# Patient Record
Sex: Male | Born: 1970
Health system: Southern US, Community
[De-identification: ages and names within clinical notes are randomized; demographics above are authoritative.]

## PROBLEM LIST (undated history)

## (undated) DIAGNOSIS — IMO0001 Reserved for inherently not codable concepts without codable children: Secondary | ICD-10-CM

## (undated) DIAGNOSIS — F32A Depression, unspecified: Secondary | ICD-10-CM

## (undated) DIAGNOSIS — E119 Type 2 diabetes mellitus without complications: Secondary | ICD-10-CM

## (undated) DIAGNOSIS — G4730 Sleep apnea, unspecified: Secondary | ICD-10-CM

## (undated) DIAGNOSIS — Z794 Long term (current) use of insulin: Secondary | ICD-10-CM

## (undated) DIAGNOSIS — I10 Essential (primary) hypertension: Secondary | ICD-10-CM

## (undated) DIAGNOSIS — J342 Deviated nasal septum: Secondary | ICD-10-CM

## (undated) DIAGNOSIS — F419 Anxiety disorder, unspecified: Secondary | ICD-10-CM

## (undated) DIAGNOSIS — M1990 Unspecified osteoarthritis, unspecified site: Secondary | ICD-10-CM

## (undated) HISTORY — PX: WISDOM TOOTH EXTRACTION: SHX21

## (undated) HISTORY — DX: Depression, unspecified: F32.A

## (undated) HISTORY — DX: Essential (primary) hypertension: I10

## (undated) HISTORY — PX: SHOULDER ARTHROSCOPY: SHX128

## (undated) HISTORY — PX: LAPAROSCOPIC GASTRIC SLEEVE RESECTION: SHX5895

## (undated) HISTORY — DX: Sleep apnea, unspecified: G47.30

## (undated) HISTORY — DX: Anxiety disorder, unspecified: F41.9

## (undated) HISTORY — PX: TYMPANOSTOMY TUBE PLACEMENT: SHX32

## (undated) HISTORY — PX: TONSILLECTOMY AND ADENOIDECTOMY: SHX28

## (undated) HISTORY — PX: LUMBAR FUSION: SHX111

---

## 2014-07-23 HISTORY — PX: CARPAL TUNNEL RELEASE: SHX101

## 2015-01-06 ENCOUNTER — Ambulatory Visit (INDEPENDENT_AMBULATORY_CARE_PROVIDER_SITE_OTHER): Payer: 59 | Admitting: Internal Medicine

## 2015-01-06 ENCOUNTER — Encounter: Payer: Self-pay | Admitting: Internal Medicine

## 2015-01-06 VITALS — BP 142/77 | HR 96 | Temp 98.0°F | Wt 309.9 lb

## 2015-01-06 DIAGNOSIS — Z23 Encounter for immunization: Secondary | ICD-10-CM | POA: Insufficient documentation

## 2015-01-06 DIAGNOSIS — N529 Male erectile dysfunction, unspecified: Secondary | ICD-10-CM | POA: Diagnosis not present

## 2015-01-06 DIAGNOSIS — Z0000 Encounter for general adult medical examination without abnormal findings: Secondary | ICD-10-CM | POA: Insufficient documentation

## 2015-01-06 DIAGNOSIS — M79641 Pain in right hand: Secondary | ICD-10-CM | POA: Diagnosis not present

## 2015-01-06 DIAGNOSIS — E119 Type 2 diabetes mellitus without complications: Secondary | ICD-10-CM | POA: Diagnosis not present

## 2015-01-06 DIAGNOSIS — I10 Essential (primary) hypertension: Secondary | ICD-10-CM | POA: Insufficient documentation

## 2015-01-06 DIAGNOSIS — Z794 Long term (current) use of insulin: Secondary | ICD-10-CM

## 2015-01-06 DIAGNOSIS — M6530 Trigger finger, unspecified finger: Secondary | ICD-10-CM | POA: Insufficient documentation

## 2015-01-06 DIAGNOSIS — E1169 Type 2 diabetes mellitus with other specified complication: Secondary | ICD-10-CM | POA: Insufficient documentation

## 2015-01-06 LAB — GLUCOSE, CAPILLARY: Glucose-Capillary: 246 mg/dL — ABNORMAL HIGH (ref 65–99)

## 2015-01-06 LAB — POCT GLYCOSYLATED HEMOGLOBIN (HGB A1C): Hemoglobin A1C: 6.8

## 2015-01-06 NOTE — Assessment & Plan Note (Signed)
Patient <44 y/o with DM. -PPSV 23

## 2015-01-06 NOTE — Assessment & Plan Note (Signed)
-  Influenza vaccine today -PPSV23 today (Patient <46 y/o w/ DM)

## 2015-01-06 NOTE — Progress Notes (Signed)
Patient ID: Adam [name], male   DOB: 1970-10-17, 44 y.o.   MRN: [id number]   Subjective:   Patient ID: Adam [name] male   DOB: 1970-09-16 44 y.o.   MRN: [id number]  HPI: [name] is a 44 y.o. male with PMH of DM, HTN, HLD, ED, allergies, depression and anxiety, OSA on CPAP, and carpal tunnel of the right hand s/p CTR in April 2016 who presents to clinic for the first time for new patient establishment. He is recently moving to [place] from [place], and states that previous medical records have been signed for release to [place].  Patient states he is due for Hgb A1C and needs test done for work (with value <8.0).   Also requesting advice on where/how to obtain supplies for CPAP.   Patient informs me that his previous physicians believe he may have low testosterone and is curious about testing for that.    Past Medical History  Diagnosis Date  . Diabetes mellitus without complication   . Hypertension   . Hyperlipidemia   . Allergy   . Depression   . Anxiety   . Sleep apnea    Current Outpatient Prescriptions  Medication Sig Dispense Refill  . albuterol (PROVENTIL HFA;VENTOLIN HFA) 108 (90 BASE) MCG/ACT inhaler Inhale 2 puffs into the lungs every 6 (six) hours as needed for wheezing or shortness of breath.    Marland Kitchen aspirin 81 MG tablet Take 81 mg by mouth daily.    . Blood Glucose Monitoring Suppl (ONETOUCH ULTRALINK) W/DEVICE KIT by Does not apply route.    . DULoxetine (CYMBALTA) 60 MG capsule Take 60 mg by mouth daily.    Marland Kitchen lisinopril (PRINIVIL,ZESTRIL) 20 MG tablet Take 20 mg by mouth daily.    . metFORMIN (GLUCOPHAGE) 1000 MG tablet Take 1,000 mg by mouth 2 (two) times daily with a meal.    . pioglitazone (ACTOS) 45 MG tablet Take 45 mg by mouth daily.    . simvastatin (ZOCOR) 20 MG tablet Take 20 mg by mouth daily.    . tadalafil (CIALIS) 20 MG tablet Take 20 mg by mouth daily as needed for erectile dysfunction.     No current facility-administered medications for  this visit.   Family History  Problem Relation Age of Onset  . Hyperlipidemia Mother   . Aneurysm Mother   . Diabetes Father   . Hyperlipidemia Father    Social History   Social History  . Marital Status: Married    Spouse Name: N/A  . Number of Children: N/A  . Years of Education: N/A   Social History Main Topics  . Smoking status: Never Smoker   . Smokeless tobacco: Former Systems developer    Types: Chew  . Alcohol Use: Yes  . Drug Use: No  . Sexual Activity: Not Asked   Other Topics Concern  . None   Social History Narrative  . None   Review of Systems: Review of Systems  Constitutional: Negative for fever, chills and weight loss.  Respiratory: Negative for cough, shortness of breath and wheezing.   Cardiovascular: Negative for chest pain, palpitations and leg swelling.  Gastrointestinal: Negative for heartburn, nausea, vomiting, abdominal pain, diarrhea and constipation.  Musculoskeletal: Positive for joint pain. Negative for myalgias.       Joint pain right hand, bilateral knees.  Neurological: Negative for dizziness, tingling, sensory change and headaches.    Objective:  Physical Exam: Filed Vitals:   01/06/15 1525  BP: 142/77  Pulse: 96  Temp: 98 F (  36.7 C)  TempSrc: Oral  Weight: 309 lb 14.4 oz (140.57 kg)  SpO2: 96%   Physical Exam  Constitutional: He is oriented to person, place, and time. He appears well-developed and well-nourished.  Pleasant, NAD, large body habitus.  HENT:  Head: Normocephalic and atraumatic.  Eyes: EOM are normal. Pupils are equal, round, and reactive to light.  Cardiovascular: Normal rate and regular rhythm.   No murmur heard. Pulmonary/Chest: Effort normal and breath sounds normal.  Abdominal: Soft. Bowel sounds are normal. There is no tenderness.  Musculoskeletal: Normal range of motion. He exhibits no edema.  Tenderness at base of thumb. Interphalangeal thumb of right hand with catching, snapping, with audible clicking on  flexion.   Neurological: He is alert and oriented to person, place, and time.  Skin: Skin is warm.  Psychiatric: He has a normal mood and affect.    Assessment & Plan:  Please see problem based charting.

## 2015-01-06 NOTE — Assessment & Plan Note (Signed)
Patient diagnosed with T2DM 13 years ago after new onset episode of DKA. States he was worked up for type 1 vs type 2. He currently is on Lantus 42 units at night which he scaled back to about 1 month ago after he was having low blood sugars in the 80s. He states that he has missed some meals and is off his usual schedule due to his move to [place] and being off of work at this time. Also on Metformin 1000 mg BID and pioglitazone 45 mg daily. Patient with average blood glucose of 167 in the last month. States he feels highs around 240 with headache and lows around 85 with shakes, dizziness, and nausea which is easily corrected with milk or juice.  CBG today of 246 (he reports eating lunch prior to office visit) Hgb A1C today of 6.8 which he states is the same as his prior level.  -Check BMP to assess renal function (currently on metformin and lisinopril) -Referral to Debera Lat, RD for diabetic and nutrition counseling

## 2015-01-06 NOTE — Assessment & Plan Note (Signed)
Patient reports history of carpal tunnel syndrome in right hand with surgical release in April 2016. He continues to have pain at the base of the right thumb and at the interphalangeal joint. There is catching and audible clicking with flexion. This is possibly a trigger finger tenosynovitis or perhaps an arthritic process. -DG right hand -No new medication today, consider initial treatment with activity modification, splinting, and NSAIDS

## 2015-01-06 NOTE — Assessment & Plan Note (Signed)
BP Readings from Last 3 Encounters:  01/06/15 142/77   -Continue Lisinopril 20 mg daily

## 2015-01-06 NOTE — Patient Instructions (Signed)
It was a pleasure to meet you [name].  We will work on determining how to provide you access to your CPAP supplies.  We have given you the flu shot and pneumonia vaccine PPSV23 today.  I will put the order in for the right hand xray, blood test to check your kidney function, and testosterone levels.  Your Hgb A1C is 6.8 today.  We will refer you to our diabetes and nutrition counselor, Butch Penny Plyler RD to help with your weight loss goals.  Please come in tomorrow morning at 9:30 am for your blood test and let them know you are here just for bloodwork.  Please follow up with [place] in 4-6 weeks.

## 2015-01-06 NOTE — Assessment & Plan Note (Signed)
Patient with erectile dysfunction for nearly 15 years. Is currently on Cialis. Patient reports that previous physicians believe this may be due to low testosterone, although his history of diabetes is a risk factor for vascular cause of ED.   -Will investigate with testosterone level tomorrow morning

## 2015-01-07 ENCOUNTER — Other Ambulatory Visit (INDEPENDENT_AMBULATORY_CARE_PROVIDER_SITE_OTHER): Payer: 59

## 2015-01-07 ENCOUNTER — Ambulatory Visit (HOSPITAL_COMMUNITY)
Admission: RE | Admit: 2015-01-07 | Discharge: 2015-01-07 | Disposition: A | Discharge status: Home / Self Care | Payer: 59 | Source: Ambulatory Visit | Attending: Internal Medicine | Admitting: Internal Medicine

## 2015-01-07 DIAGNOSIS — E1165 Type 2 diabetes mellitus with hyperglycemia: Secondary | ICD-10-CM

## 2015-01-07 DIAGNOSIS — M79641 Pain in right hand: Secondary | ICD-10-CM

## 2015-01-07 DIAGNOSIS — E119 Type 2 diabetes mellitus without complications: Secondary | ICD-10-CM

## 2015-01-07 DIAGNOSIS — N529 Male erectile dysfunction, unspecified: Secondary | ICD-10-CM

## 2015-01-07 DIAGNOSIS — M79644 Pain in right finger(s): Secondary | ICD-10-CM | POA: Diagnosis not present

## 2015-01-07 DIAGNOSIS — Z794 Long term (current) use of insulin: Secondary | ICD-10-CM | POA: Diagnosis not present

## 2015-01-08 LAB — BMP8+ANION GAP
Anion Gap: 18 mmol/L (ref 10.0–18.0)
BUN/Creatinine Ratio: 18 (ref 9–20)
BUN: 13 mg/dL (ref 6–24)
CO2: 22 mmol/L (ref 18–29)
Calcium: 9.5 mg/dL (ref 8.7–10.2)
Chloride: 99 mmol/L (ref 97–108)
Creatinine, Ser: 0.74 mg/dL — ABNORMAL LOW (ref 0.76–1.27)
GFR calc Af Amer: 128 mL/min/{1.73_m2} (ref >59)
GFR calc non Af Amer: 111 mL/min/{1.73_m2} (ref >59)
Glucose: 190 mg/dL — ABNORMAL HIGH (ref 65–99)
Potassium: 4.7 mmol/L (ref 3.5–5.2)
Sodium: 139 mmol/L (ref 134–144)

## 2015-01-08 LAB — TESTOSTERONE: Testosterone: 284 ng/dL — ABNORMAL LOW (ref 348–1197)

## 2015-01-10 ENCOUNTER — Telehealth: Payer: Self-pay | Admitting: *Deleted

## 2015-01-10 NOTE — Telephone Encounter (Signed)
Pt here on Fri 09/16 for lab only appt.  Has paperwork from [facility] (DOT physical).  Because pt has a diagnosis of HTN, DM, and OSA, they are requesting the following " a letter or medical records indicating the current status and treatment of the above condition(s) with recent lab date, vital signs, medication changes, etc" Letter is also requesting sleep study report or recent download of CPAP machine.  Form placed in [facility] medical records.Regenia Skeeter, Darlene Cassady9/19/20169:07 AM

## 2015-01-11 ENCOUNTER — Telehealth: Payer: Self-pay | Admitting: *Deleted

## 2015-01-11 NOTE — Progress Notes (Addendum)
Internal Medicine Clinic Attending  I saw and evaluated the patient.  I personally confirmed the key portions of the history and exam documented by Dr. Zada Finders and I reviewed pertinent patient test results.  The assessment, diagnosis, and plan were formulated together and I agree with the documentation in the resident's note.  Low testosterone levels on random check, 284. Will send a message to Dr Evette Doffing, who is the new PCP regarding this for further follow up. I tried to call the patient on [phone] but was not able to reach him or leave a message. I will contact triage to call the patient.  Madilyn Fireman MD MPH 01/11/2015 10:43 AM

## 2015-01-11 NOTE — Telephone Encounter (Signed)
Per dr Ellwood Dense called pt, discussed his testosterone value and what dr bhardwaj recommended, appt w/ dr Evette Doffing- first available- 11/7 at [place]. Reassured pt, he states he is good and will speak with dr Evette Doffing at appt about f/u

## 2015-01-27 ENCOUNTER — Other Ambulatory Visit: Payer: Self-pay | Admitting: Student in an Organized Health Care Education/Training Program

## 2015-01-27 DIAGNOSIS — E119 Type 2 diabetes mellitus without complications: Secondary | ICD-10-CM

## 2015-01-27 NOTE — Telephone Encounter (Signed)
Pt called requesting Lantus to be filled @ [facility].

## 2015-01-28 MED ORDER — INSULIN GLARGINE 100 UNIT/ML ~~LOC~~ SOLN: 42.0000 [IU] | Freq: Every day | SUBCUTANEOUS | Status: DC

## 2015-02-09 ENCOUNTER — Encounter: Payer: 59 | Admitting: Dietician

## 2015-02-22 ENCOUNTER — Other Ambulatory Visit: Payer: Self-pay | Admitting: Student in an Organized Health Care Education/Training Program

## 2015-02-22 DIAGNOSIS — Z794 Long term (current) use of insulin: Principal | ICD-10-CM

## 2015-02-22 DIAGNOSIS — N529 Male erectile dysfunction, unspecified: Secondary | ICD-10-CM

## 2015-02-22 DIAGNOSIS — E119 Type 2 diabetes mellitus without complications: Secondary | ICD-10-CM

## 2015-02-22 NOTE — Telephone Encounter (Signed)
Pt's wife called for refills on Cymbalta, Cialis, Lantus and Actos for a 90 day supply to be sent to [facility].

## 2015-02-24 MED ORDER — INSULIN GLARGINE 100 UNIT/ML ~~LOC~~ SOLN: 42.0000 [IU] | Freq: Every day | SUBCUTANEOUS | Status: DC

## 2015-02-24 MED ORDER — TADALAFIL 20 MG PO TABS: 20.0000 mg | ORAL_TABLET | Freq: Every day | ORAL | Status: DC | PRN

## 2015-02-24 MED ORDER — DULOXETINE HCL 60 MG PO CPEP: 60.0000 mg | ORAL_CAPSULE | Freq: Every day | ORAL | Status: DC

## 2015-02-24 MED ORDER — PIOGLITAZONE HCL 45 MG PO TABS: 45.0000 mg | ORAL_TABLET | Freq: Every day | ORAL | Status: DC

## 2015-02-28 ENCOUNTER — Ambulatory Visit (INDEPENDENT_AMBULATORY_CARE_PROVIDER_SITE_OTHER): Payer: 59 | Admitting: Dietician

## 2015-02-28 ENCOUNTER — Encounter: Payer: Self-pay | Admitting: Dietician

## 2015-02-28 ENCOUNTER — Ambulatory Visit (INDEPENDENT_AMBULATORY_CARE_PROVIDER_SITE_OTHER): Payer: 59 | Admitting: Student in an Organized Health Care Education/Training Program

## 2015-02-28 VITALS — BP 141/70 | HR 75 | Temp 98.7°F | Wt 305.7 lb

## 2015-02-28 DIAGNOSIS — Z713 Dietary counseling and surveillance: Secondary | ICD-10-CM | POA: Diagnosis not present

## 2015-02-28 DIAGNOSIS — M65311 Trigger thumb, right thumb: Secondary | ICD-10-CM

## 2015-02-28 DIAGNOSIS — N529 Male erectile dysfunction, unspecified: Secondary | ICD-10-CM

## 2015-02-28 DIAGNOSIS — E119 Type 2 diabetes mellitus without complications: Secondary | ICD-10-CM

## 2015-02-28 DIAGNOSIS — Z7984 Long term (current) use of oral hypoglycemic drugs: Secondary | ICD-10-CM

## 2015-02-28 DIAGNOSIS — G4733 Obstructive sleep apnea (adult) (pediatric): Secondary | ICD-10-CM

## 2015-02-28 DIAGNOSIS — Z794 Long term (current) use of insulin: Secondary | ICD-10-CM | POA: Diagnosis not present

## 2015-02-28 DIAGNOSIS — M6530 Trigger finger, unspecified finger: Secondary | ICD-10-CM

## 2015-02-28 DIAGNOSIS — I10 Essential (primary) hypertension: Secondary | ICD-10-CM

## 2015-02-28 DIAGNOSIS — Z9989 Dependence on other enabling machines and devices: Secondary | ICD-10-CM

## 2015-02-28 MED ORDER — INSULIN GLARGINE 100 UNIT/ML ~~LOC~~ SOLN: 30.0000 [IU] | Freq: Every day | SUBCUTANEOUS | Status: DC

## 2015-02-28 MED ORDER — LIRAGLUTIDE 18 MG/3ML ~~LOC~~ SOPN: 0.6000 mg | PEN_INJECTOR | Freq: Every day | SUBCUTANEOUS | Status: DC

## 2015-02-28 NOTE — Assessment & Plan Note (Signed)
Blood pressure under good control. Continue Lisinopril 20 mg daily, adding GLP1 inhibitor today will also likely improve BP control.

## 2015-02-28 NOTE — Assessment & Plan Note (Addendum)
Type 2 DM diagnosed around 2003. Current regimen is metformin 1000 mg twice a day, pioglitazone 45 mg daily, and Lantus 42 units nightly. Excellent A1c control at 6.8%. Patient has a strong desire for weight loss. I think his high dose of insulin has been associated with progressive weight gain over the last 10 years. We discussed different techniques to try to reduce his weight. We decided to start a GLP-1 agonist with the goal of downtitrating insulin products and promoting weightloss as well as improve cardiovascular health. We talked about side effect profiles including GI upset. - Start Liraglutide 0.6mg  daily, increase by 0.6mg  weekly to max dose of 3mg  daily. - Decrease Lantus to 30 units daily, and decrease further as able while uptitrating liraglutide.  - Recheck A1c in 3 months - Continue Asprin and Simvastatin for primary prevention - Lisinopril for renal protection

## 2015-02-28 NOTE — Patient Instructions (Signed)
Follow up in 5 weeks per appointment.   At follow up we will discuss how you are doing with decreasing alcoholic beverages and changing diet soda to clear ones without phosphoric acid  Please bring your blood sugar meter with you.   Call anytime between visits for questions or concerns.   Butch Penny [phone]

## 2015-02-28 NOTE — Assessment & Plan Note (Addendum)
Exam consistent with right thumb trigger finger related to moderate osteoarthritis. I gave him an injection of triamcinolone into the tendon sheath for symptomatic treatment.   Trigger Point Injection   Pre-operative diagnosis: myofascial pain  After risks and benefits were explained including bleeding, infection, worsening of the pain, damage to the area being injected, weakness, allergic reaction to medications, vascular injection, and nerve damage, signed consent was obtained.  All questions were answered.    The area of the trigger point was identified and the skin prepped three times with alcohol and the alcohol allowed to dry.  Next, a 25 gauge 1 inch needle was placed in the area of the trigger point.  Once reproduction of the pain was elicited and negative aspiration confirmed, the trigger point was injected and the needle removed.    The patient did tolerate the procedure well and there were not complications.    Medication used: 10 mg Kenalog; 0.6 mL 1% Lidocaine.    Trigger points injected: 1    Trigger point(s) location(s):  right thumb

## 2015-02-28 NOTE — Assessment & Plan Note (Signed)
OSA diagnosed in 2015 with two part sleep study in [place]. He is now using CPAP machine with good compliance. Continue nocturnal therapy.

## 2015-02-28 NOTE — Assessment & Plan Note (Signed)
Here for follow up of low AM testosterone level at 280. No signs of early androgen deficiency, not interested in fertility. Complains of mild fatigue throughout the day. Decreased tumescence and lower sex drive. He has intercourse 2-3 times weekly. Etiology could be due to mild hypoandrogenism with age and longterm use of opiates. We talked about the mild benefits of testosterone therapy and the likely risks of prostate cancer and increased risk of VTE. We decided to defer treatment, monitor symptoms, and potentially recheck testosterone in one year if symptoms worsen.

## 2015-02-28 NOTE — Patient Instructions (Addendum)
1. Start Victoza at a low dose 0.6mg  daily.   0.6mg  daily for one week, increase to 1.2mg  daily week 2, then 1.8mg  daily week 3, the 2.4mg  daily week 4, 3mg  daily week 5. 3mg  daily is the maximum dose.   2. Decrease your Lantus to 30 units daily. Continue to decrease the dose as you increase Victoza dose.   Liraglutide injection What is this medicine? LIRAGLUTIDE (LIR a GLOO tide) is used to improve blood sugar control in adults with type 2 diabetes. This medicine may be used with other oral diabetes medicines. This medicine may be used for other purposes; ask your health care provider or pharmacist if you have questions. What should I tell my health care provider before I take this medicine? They need to know if you have any of these conditions: -endocrine tumors (MEN 2) or if someone in your family had these tumors -gallstones -high cholesterol -history of alcohol abuse problem -history of pancreatitis -kidney disease or if you are on dialysis -liver disease -previous swelling of the tongue, face, or lips with difficulty breathing, difficulty swallowing, hoarseness, or tightening of the throat -stomach problems -suicidal thoughts, plans, or attempt; a previous suicide attempt by you or a family member -thyroid cancer or if someone in your family had thyroid cancer -an unusual or allergic reaction to liraglutide, medicines, foods, dyes, or preservatives -pregnant or trying to get pregnant -breast-feeding How should I use this medicine? This medicine is for injection under the skin of your upper leg, stomach area, or upper arm. You will be taught how to prepare and give this medicine. Use exactly as directed. Take your medicine at regular intervals. Do not take it more often than directed. It is important that you put your used needles and syringes in a special sharps container. Do not put them in a trash can. If you do not have a sharps container, call your pharmacist or healthcare  provider to get one. A special MedGuide will be given to you by the pharmacist with each prescription and refill. Be sure to read this information carefully each time. Talk to your pediatrician regarding the use of this medicine in children. Special care may be needed. Overdosage: If you think you have taken too much of this medicine contact a poison control center or emergency room at once. NOTE: This medicine is only for you. Do not share this medicine with others. What if I miss a dose? If you miss a dose, take it as soon as you can. If it is almost time for your next dose, take only that dose. Do not take double or extra doses. What may interact with this medicine? -acetaminophen -atorvastatin -birth control pills -digoxin -griseofulvin -lisinoprilMany medications may cause changes in blood sugar, these include: -alcohol containing beverages -aspirin and aspirin-like drugs -chloramphenicol -chromium -diuretics -male hormones, such as estrogens or progestins, birth control pills -heart medicines -isoniazid -male hormones or anabolic steroids -medications for weight loss -medicines for allergies, asthma, cold, or cough -medicines for mental problems -medicines called MAO inhibitors - Nardil, Parnate, Marplan, Eldepryl -niacin -NSAIDS, such as ibuprofen -pentamidine -phenytoin -probenecid -quinolone antibiotics such as ciprofloxacin, levofloxacin, ofloxacin -some herbal dietary supplements -steroid medicines such as prednisone or cortisone -thyroid hormonesSome medications can hide the warning symptoms of low blood sugar (hypoglycemia). You may need to monitor your blood sugar more closely if you are taking one of these medications. These include: -beta-blockers, often used for high blood pressure or heart problems (examples include atenolol, metoprolol, propranolol) -  clonidine -guanethidine -reserpine This list may not describe all possible interactions. Give your health  care provider a list of all the medicines, herbs, non-prescription drugs, or dietary supplements you use. Also tell them if you smoke, drink alcohol, or use illegal drugs. Some items may interact with your medicine. What should I watch for while using this medicine? Visit your doctor or health care professional for regular checks on your progress. A test called the HbA1C (A1C) will be monitored. This is a simple blood test. It measures your blood sugar control over the last 2 to 3 months. You will receive this test every 3 to 6 months. Learn how to check your blood sugar. Learn the symptoms of low and high blood sugar and how to manage them. Always carry a quick-source of sugar with you in case you have symptoms of low blood sugar. Examples include hard sugar candy or glucose tablets. Make sure others know that you can choke if you eat or drink when you develop serious symptoms of low blood sugar, such as seizures or unconsciousness. They must get medical help at once. Tell your doctor or health care professional if you have high blood sugar. You might need to change the dose of your medicine. If you are sick or exercising more than usual, you might need to change the dose of your medicine. Do not skip meals. Ask your doctor or health care professional if you should avoid alcohol. Many nonprescription cough and cold products contain sugar or alcohol. These can affect blood sugar. Liraglutide pens and cartridges should never be shared. Even if the needle is changed, sharing may result in passing of viruses like hepatitis or HIV. Wear a medical ID bracelet or chain, and carry a card that describes your disease and details of your medicine and dosage times. Patients and their families should watch out for worsening depression or thoughts of suicide. Also watch out for sudden changes in feelings such as feeling anxious, agitated, panicky, irritable, hostile, aggressive, impulsive, severely restless, overly  excited and hyperactive, or not being able to sleep. If this happens, especially at the beginning of treatment or after a change in dose, call your health care professional. What side effects may I notice from receiving this medicine? Side effects that you should report to your doctor or health care professional as soon as possible: -allergic reactions like skin rash, itching or hives, swelling of the face, lips, or tongue -breathing problems -fever, chills -loss of appetite -signs and symptoms of low blood sugar such as feeling anxious, confusion, dizziness, increased hunger, unusually weak or tired, sweating, shakiness, cold, irritable, headache, blurred vision, fast heartbeat, loss of consciousness -trouble passing urine or change in the amount of urine -unusual stomach pain or upset -vomiting Side effects that usually do not require medical attention (Report these to your doctor or health care professional if they continue or are bothersome.): -constipation -diarrhea -fatigue -headache -nausea This list may not describe all possible side effects. Call your doctor for medical advice about side effects. You may report side effects to FDA at 1-800-FDA-1088. Where should I keep my medicine? Keep out of the reach of children. Store unopened pen in a refrigerator between 2 and 8 degrees C (36 and 46 degrees F). Do not freeze or use if the medicine has been frozen. Protect from light and excessive heat. After you first use the pen, it can be stored at room temperature between 15 and 30 degrees C (59 and 86 degrees F) or  in a refrigerator. Throw away your used pen after 30 days or after the expiration date, whichever comes first. Do not store your pen with the needle attached. If the needle is left on, medicine may leak from the pen. NOTE: This sheet is a summary. It may not cover all possible information. If you have questions about this medicine, talk to your doctor, pharmacist, or health care  provider.    2016, Elsevier/Gold Standard. (2013-06-18 10:19:14)

## 2015-02-28 NOTE — Progress Notes (Signed)
Medical Nutrition Therapy:  Appt start time: 0910 end time:  1000. Visit # 1- suggested 3-4 series of visits  Assessment:  Primary concerns today: weight loss and healthy diet Mr. Veras has had diabetes ~ 12 years. He had diabetes training when he was first diagnosed and learned a lot about food from his mother. He is motivated to change his diet to be sure he has all the nutrients to be healthy and decrease his weight. He likes to cook, has supportive wife and daughter. He has busy job where he travels and [place] out often, he tries to chose healthy meals as much as possible. His first goal is to decrease the amount of alcohol he drinks and begin changing his diet drinks to clear ones without phosphoric acid. His new diabetes regimen should allow for weight loss.   Preferred Learning Style: No preference indicated  Learning Readiness: Ready and Change in progress  ANTHROPOMETRICS:  BMI: >40- class 3 obesity WEIGHT HISTORY:"always big", weight watchers in early 37s got down to 190#, gained to 245# until dx with diabetes at ~34, then gained ~50# after diabetes diagnosis, no goal weight as of today, recommend at least 7% loss to ~280# SLEEP:uses Cpap and sleeps ~ 8 hours MEDICATIONS: actos, lantus decreasing from 50 to 30 units today as he begins victoza and self titrates it, metformin BLOOD SUGAR: A1C is 6.8% and he reports this has been stable lately, he reports blood sugars ~ 140 in the am,  has lows at times, yesterday when he exercised and delayed eating he felt low at CBG of 87, he treats lows with milk or juice DIETARY INTAKE: Usual eating pattern includes 2-3 meals and ~2  snacks per day. Everyday foods include fruit, nuts, vegetables, meat, dairy.  Avoided foods include cereal, not big on sweets, chips one time a week.   24-hr recall: ( estimated ~ 2500 or more calories/day)  B (6 AM): 12 oz coffee with skim milk and equal Snk ( before 8 AM): 3 times/week has a breakfast sandwich from a  restaurant: cheese, egg, sausage on a bun, diet soda, scrambled egg and toast on weekends uses olive oil, healthier sausage L ( 1130-3PM): ham & cheese on wheat sandwich or salad with blue cheese dressing D ( PM): Home: chicken, fish, vegetable, salad, out- [employer]- [place] [employer], salad,.diet soda, [employer] steak or chicken & vegetable, salad, diet soda Snk ( PM): not regularly, but when he does it is:: fruit or carrots and hummus or almonds or other unsalted nuts Beverages: diet soda- coke, water, coffee, g2 Gatorade, vodka 3-4 drinks 3x/week, 1-2 beer a few times a week  Usual physical activity: hiked 2 miles this weekend, active on his job, need to assess in more detail at next visit  Estimated daily energy needs:  3,100 Calories/day to maintain  weight.   2,600 Calories/day to lose 1 lb per week.   2,100 Calories/day to lose 2 lb per week.  270-310 g carbohydrates ~130 g protein  ~65 g fat 28 g alcohol   Progress Towards Goal(s):  In progress.   Nutritional Diagnosis:  NI-1.5 Excessive energy intake As related to food and beverage choices.  As evidenced by his 24 hours recall and description of food/beverage intake..    Intervention:  Nutrition assessment & education about current dietary recommendations on alcohol intake, guidelines for a well balanced healthy diet. Education sheet on GLP-1s was provided to patient today and discussed possible side effect of nausea and how to  handle  Coordination of care: none needed today Teaching Method Utilized: Visual,  Auditory Handouts given during visit include: GLP-1 education sheet, healthy oils/fats Barriers to learning/adherence to lifestyle change: none discussed today Demonstrated degree of understanding via:  Teach Back   Monitoring/Evaluation:  Dietary intake, exercise, meter, and body weight in 5 week(s).

## 2015-02-28 NOTE — Progress Notes (Signed)
   See Encounters tab for problem-based medical decision making  __________________________________________________________  HPI:  44 year old man with type 2 diabetes presents today for discussion of low testosterone levels. Complaints today include generalized fatigue throughout the day. 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 This is a physically intensive job. Feels like he is having increased energy at the end of his day. Denies significant sleepiness. No dyspnea on exertion or chest pain. He reports some reduced libido. Still has nocturnal erections normally. Currently engaging in sex about 2-3 times weekly with his wife. No changes in hair distribution. He does endorse progressive weight gain over the last 10 years. He is trying to exercise more and eat a better diet. Lost 4 pounds since our last clinic visit. Eating and drinking well. No fevers or chills. Sleeps well at night, compliant with CPAP machine every night the last 1 year. Compliant with his diabetes medications, recently he has been titrating down Lantus from 50 units daily to 42 units daily. Denies any recent hypoglycemic events.   __________________________________________________________  Problem List: Patient Active Problem List   Diagnosis Date Noted  . Obstructive sleep apnea 02/28/2015  . Diabetes ([place]) 01/06/2015  . Erectile dysfunction 01/06/2015  . Trigger finger 01/06/2015  . Healthcare maintenance 01/06/2015  . Essential hypertension 01/06/2015    Medications: Reconciled today in Epic __________________________________________________________  Physical Exam:  Vital Signs: Filed Vitals:   02/28/15 0824  BP: 141/70  Pulse: 75  Temp: 98.7 F (37.1 C)  TempSrc: Oral  Weight: 305 lb 11.2 oz (138.665 kg)  SpO2: 99%    Gen: Well appearing, NAD Neck: No cervical LAD, thick neck, No thyromegaly or nodules, No JVD. CV: RRR, no murmurs Pulm: Normal effort, CTA throughout, no  wheezing Abd: Soft, NT, ND, normal BS.  Ext: Warm, no edema, normal joints Skin: No atypical appearing moles. No rashes, many macules on his back, heavily tatooed.

## 2015-04-01 ENCOUNTER — Encounter (HOSPITAL_COMMUNITY): Payer: Self-pay | Admitting: Emergency Medicine

## 2015-04-01 ENCOUNTER — Emergency Department (HOSPITAL_COMMUNITY)
Admission: EM | Admit: 2015-04-01 | Discharge: 2015-04-01 | Disposition: A | Discharge status: Home / Self Care | Payer: 59 | Source: Home / Self Care | Attending: Family Medicine | Admitting: Family Medicine

## 2015-04-01 ENCOUNTER — Emergency Department (INDEPENDENT_AMBULATORY_CARE_PROVIDER_SITE_OTHER): Payer: 59

## 2015-04-01 DIAGNOSIS — J302 Other seasonal allergic rhinitis: Secondary | ICD-10-CM

## 2015-04-01 MED ORDER — METHYLPREDNISOLONE SODIUM SUCC 125 MG IJ SOLR: 125.0000 mg | Freq: Once | INTRAMUSCULAR | Status: DC

## 2015-04-01 MED ORDER — ALBUTEROL SULFATE (2.5 MG/3ML) 0.083% IN NEBU
2.5000 mg | INHALATION_SOLUTION | Freq: Once | RESPIRATORY_TRACT | Status: AC
  Administered 2015-04-01: 2.5 mg via RESPIRATORY_TRACT

## 2015-04-01 MED ORDER — ALBUTEROL SULFATE (2.5 MG/3ML) 0.083% IN NEBU
INHALATION_SOLUTION | RESPIRATORY_TRACT | Status: AC
  Filled 2015-04-01: qty 3

## 2015-04-01 MED ORDER — METHYLPREDNISOLONE SODIUM SUCC 125 MG IJ SOLR
INTRAMUSCULAR | Status: AC
  Filled 2015-04-01: qty 2

## 2015-04-01 MED ORDER — ALBUTEROL SULFATE HFA 108 (90 BASE) MCG/ACT IN AERS: 2.0000 | INHALATION_SPRAY | Freq: Four times a day (QID) | RESPIRATORY_TRACT | Status: DC | PRN

## 2015-04-01 MED ORDER — IPRATROPIUM-ALBUTEROL 0.5-2.5 (3) MG/3ML IN SOLN
3.0000 mL | Freq: Once | RESPIRATORY_TRACT | Status: AC
  Administered 2015-04-01: 3 mL via RESPIRATORY_TRACT

## 2015-04-01 MED ORDER — IPRATROPIUM-ALBUTEROL 0.5-2.5 (3) MG/3ML IN SOLN
RESPIRATORY_TRACT | Status: AC
  Filled 2015-04-01: qty 3

## 2015-04-01 NOTE — ED Notes (Signed)
Reports having a cold that has gone into his chest.  Reports symptoms for 2 weeks.  Patient complains of wheezing, sob, and heaviness

## 2015-04-01 NOTE — ED Provider Notes (Signed)
CSN: [id number]     Arrival date & time 04/01/15  1259 History   First MD Initiated Contact with Patient 04/01/15 1322     Chief Complaint  Patient presents with  . URI  . Bronchitis   (Consider location/radiation/quality/duration/timing/severity/associated sxs/prior Treatment) Patient is a 44 y.o. male presenting with URI. The history is provided by the patient and the spouse.  URI Presenting symptoms: congestion and cough   Presenting symptoms: no fever and no rhinorrhea   Severity:  Moderate Duration:  2 weeks Progression:  Worsening Chronicity:  Chronic Relieved by:  Nothing Worsened by:  Nothing tried Ineffective treatments:  OTC medications Associated symptoms: wheezing   Risk factors comment:  Nonsmoker   Past Medical History  Diagnosis Date  . Diabetes mellitus without complication ([place])   . Hypertension   . Hyperlipidemia   . Allergy   . Depression   . Anxiety   . Sleep apnea    Past Surgical History  Procedure Laterality Date  . Shoulder surgery    . Spine surgery      Spinal L5-S1  . Carpal tunnel release  April 2016   Family History  Problem Relation Age of Onset  . Hyperlipidemia Mother   . Aneurysm Mother   . Diabetes Father   . Hyperlipidemia Father    Social History  Substance Use Topics  . Smoking status: Never Smoker   . Smokeless tobacco: Former Systems developer    Types: Chew  . Alcohol Use: Yes    Review of Systems  Constitutional: Negative.  Negative for fever.  HENT: Positive for congestion. Negative for postnasal drip and rhinorrhea.   Respiratory: Positive for cough, shortness of breath and wheezing.   Cardiovascular: Negative.   All other systems reviewed and are negative.   Allergies  Review of patient's allergies indicates no known allergies.  Home Medications   Prior to Admission medications   Medication Sig Start Date End Date Taking? Authorizing Provider  albuterol (PROVENTIL HFA;VENTOLIN HFA) 108 (90 BASE) MCG/ACT inhaler  Inhale 2 puffs into the lungs every 6 (six) hours as needed for wheezing or shortness of breath. 04/01/15   Billy Fischer, MD  aspirin 81 MG tablet Take 81 mg by mouth daily.    Historical Provider, MD  Blood Glucose Monitoring Suppl (ONETOUCH ULTRALINK) W/DEVICE KIT by Does not apply route.    Historical Provider, MD  DULoxetine (CYMBALTA) 60 MG capsule Take 1 capsule (60 mg total) by mouth daily. 02/24/15   Axel Filler, MD  insulin glargine (LANTUS) 100 UNIT/ML injection Inject 0.3 mLs (30 Units total) into the skin at bedtime. 02/28/15   Axel Filler, MD  Liraglutide (VICTOZA) 18 MG/3ML SOPN Inject 0.1 mLs (0.6 mg total) into the skin daily. Increase by 0.60m per week to max dose of 344mdaily 02/28/15   DuAxel FillerMD  lisinopril (PRINIVIL,ZESTRIL) 20 MG tablet Take 20 mg by mouth daily.    Historical Provider, MD  metFORMIN (GLUCOPHAGE) 1000 MG tablet Take 1,000 mg by mouth 2 (two) times daily with a meal.    Historical Provider, MD  pioglitazone (ACTOS) 45 MG tablet Take 1 tablet (45 mg total) by mouth daily. 02/24/15   DuAxel FillerMD  simvastatin (ZOCOR) 20 MG tablet Take 20 mg by mouth daily.    Historical Provider, MD  tadalafil (CIALIS) 20 MG tablet Take 1 tablet (20 mg total) by mouth daily as needed for erectile dysfunction. 02/24/15   DuAxel FillerMD  Meds Ordered and Administered this Visit   Medications  methylPREDNISolone sodium succinate (SOLU-MEDROL) 125 mg/2 mL injection 125 mg (125 mg Intravenous Not Given 04/01/15 1537)  ipratropium-albuterol (DUONEB) 0.5-2.5 (3) MG/3ML nebulizer solution 3 mL (3 mLs Nebulization Given 04/01/15 1537)  albuterol (PROVENTIL) (2.5 MG/3ML) 0.083% nebulizer solution 2.5 mg (2.5 mg Nebulization Given 04/01/15 1537)    BP 166/88 mmHg  Pulse 91  Temp(Src) 97.3 F (36.3 C) (Oral)  Resp 16  SpO2 95% No data found.   Physical Exam  Constitutional: He is oriented to person, place, and time. He appears  well-developed and well-nourished. No distress.  HENT:  Right Ear: External ear normal.  Left Ear: External ear normal.  Mouth/Throat: Oropharynx is clear and moist.  Eyes: Conjunctivae are normal. Pupils are equal, round, and reactive to light.  Neck: Normal range of motion. Neck supple.  Cardiovascular: Normal rate, normal heart sounds and intact distal pulses.   Pulmonary/Chest: Effort normal and breath sounds normal. He has no wheezes.  Neurological: He is alert and oriented to person, place, and time.  Skin: Skin is warm and dry.  Nursing note and vitals reviewed.   ED Course  Procedures (including critical care time)  Labs Review Labs Reviewed - No data to display  Imaging Review Dg Chest 2 View  04/01/2015  CLINICAL DATA:  Cough and congestion EXAM: CHEST  2 VIEW COMPARISON:  None. FINDINGS: Lungs are clear. Heart size and pulmonary vascularity are normal. No adenopathy. No bone lesions. IMPRESSION: No edema or consolidation. Electronically Signed   By: Lowella Grip III M.D.   On: 04/01/2015 14:10    X-rays reviewed and report per radiologist.  Visual Acuity Review  Right Eye Distance:   Left Eye Distance:   Bilateral Distance:    Right Eye Near:   Left Eye Near:    Bilateral Near:         MDM   1. Seasonal allergic reaction    Sx improved, lungs clear at d/c.    Billy Fischer, MD 04/01/15 1821

## 2015-04-01 NOTE — Discharge Instructions (Signed)
Use medicine as needed, mucinex and plenty of fluids

## 2015-04-08 ENCOUNTER — Ambulatory Visit: Payer: 59 | Admitting: Dietician

## 2015-04-11 ENCOUNTER — Other Ambulatory Visit: Payer: Self-pay | Admitting: Student in an Organized Health Care Education/Training Program

## 2015-04-11 DIAGNOSIS — I10 Essential (primary) hypertension: Secondary | ICD-10-CM

## 2015-04-11 MED ORDER — METFORMIN HCL 1000 MG PO TABS: 1000.0000 mg | ORAL_TABLET | Freq: Two times a day (BID) | ORAL | Status: DC

## 2015-04-11 MED ORDER — LISINOPRIL 20 MG PO TABS: 20.0000 mg | ORAL_TABLET | Freq: Every day | ORAL | Status: DC

## 2015-04-11 NOTE — Telephone Encounter (Signed)
Pt requesting metformin, lisinopril and Lantus pen to be filled @ outpatient pharmacy.

## 2015-04-11 NOTE — Telephone Encounter (Signed)
lantus has 5 refills, other requests sent to PCP.  Will notify pt when complete

## 2015-04-22 LAB — HM DIABETES EYE EXAM

## 2015-05-03 ENCOUNTER — Encounter: Payer: Self-pay | Admitting: *Deleted

## 2015-05-10 ENCOUNTER — Other Ambulatory Visit: Payer: Self-pay | Admitting: Student in an Organized Health Care Education/Training Program

## 2015-05-10 DIAGNOSIS — I10 Essential (primary) hypertension: Secondary | ICD-10-CM

## 2015-05-10 DIAGNOSIS — E119 Type 2 diabetes mellitus without complications: Secondary | ICD-10-CM

## 2015-05-10 DIAGNOSIS — Z794 Long term (current) use of insulin: Secondary | ICD-10-CM

## 2015-05-10 NOTE — Telephone Encounter (Signed)
Pt requesting all meds to be filled @ outpatient pharmacy.

## 2015-05-11 MED ORDER — SIMVASTATIN 20 MG PO TABS: 20.0000 mg | ORAL_TABLET | Freq: Every day | ORAL | Status: DC

## 2015-05-11 MED ORDER — ALBUTEROL SULFATE HFA 108 (90 BASE) MCG/ACT IN AERS
2.0000 | INHALATION_SPRAY | Freq: Four times a day (QID) | RESPIRATORY_TRACT | Status: DC | PRN
Start: 2015-05-11 — End: 2016-06-08

## 2015-05-11 MED FILL — VENTOLIN HFA 90 MCG INHALER: 108 (90 BAS | 25 days supply | Qty: 18 | Fill #0

## 2015-05-11 MED FILL — metFORMIN HCL 1000 MG TABS: 1000 | 30 days supply | Qty: 60 | Fill #1

## 2015-05-11 MED FILL — SIMVASTATIN 20 MG TABLET: 20 | 30 days supply | Qty: 30 | Fill #0

## 2015-05-27 ENCOUNTER — Other Ambulatory Visit: Payer: Self-pay | Admitting: Student in an Organized Health Care Education/Training Program

## 2015-05-27 MED FILL — DULoxetine HCL 60 MG CPEP: 60 | 90 days supply | Qty: 90 | Fill #1

## 2015-05-27 MED FILL — CIALIS 20 MG TABLET: 20 | 30 days supply | Qty: 6 | Fill #1

## 2015-05-27 MED FILL — LANTUS 100 UNITS/ML VIAL: 100 | 72 days supply | Qty: 30 | Fill #2

## 2015-05-27 NOTE — Telephone Encounter (Signed)
Called pt - stated he need refills on Cymbalta and lantus - he has refills, informed pt to call the pharmacy.

## 2015-05-27 NOTE — Telephone Encounter (Signed)
Pt requesting all meds to be filled @ outpatient pharmacy.

## 2015-06-13 MED FILL — SIMVASTATIN 20 MG TABLET: 20 | 90 days supply | Qty: 90 | Fill #1

## 2015-06-13 MED FILL — metFORMIN HCL 1000 MG TABS: 1000 | 30 days supply | Qty: 60 | Fill #2

## 2015-06-29 ENCOUNTER — Encounter: Payer: Self-pay | Admitting: Internal Medicine

## 2015-06-29 ENCOUNTER — Ambulatory Visit (INDEPENDENT_AMBULATORY_CARE_PROVIDER_SITE_OTHER): Payer: 59 | Admitting: Internal Medicine

## 2015-06-29 ENCOUNTER — Ambulatory Visit (HOSPITAL_COMMUNITY)
Admission: RE | Admit: 2015-06-29 | Discharge: 2015-06-29 | Disposition: A | Discharge status: Home / Self Care | Payer: 59 | Source: Ambulatory Visit | Attending: Student in an Organized Health Care Education/Training Program | Admitting: Student in an Organized Health Care Education/Training Program

## 2015-06-29 DIAGNOSIS — Z794 Long term (current) use of insulin: Secondary | ICD-10-CM | POA: Diagnosis not present

## 2015-06-29 DIAGNOSIS — R05 Cough: Secondary | ICD-10-CM | POA: Diagnosis not present

## 2015-06-29 DIAGNOSIS — R0602 Shortness of breath: Secondary | ICD-10-CM | POA: Diagnosis not present

## 2015-06-29 DIAGNOSIS — R059 Cough, unspecified: Secondary | ICD-10-CM

## 2015-06-29 DIAGNOSIS — E119 Type 2 diabetes mellitus without complications: Secondary | ICD-10-CM

## 2015-06-29 LAB — GLUCOSE, CAPILLARY: GLUCOSE-CAPILLARY: 159 mg/dL — AB (ref 65–99)

## 2015-06-29 LAB — POCT GLYCOSYLATED HEMOGLOBIN (HGB A1C): HEMOGLOBIN A1C: 7.5

## 2015-06-29 MED ORDER — GUAIFENESIN-DM 100-10 MG/5ML PO SYRP: 5.0000 mL | ORAL_SOLUTION | ORAL | Status: DC | PRN

## 2015-06-29 NOTE — Assessment & Plan Note (Addendum)
Pt with symptoms suggestive of Viral URTI. He had initial sorethroat, and still with persistent clear rhinorrhea. He now says he gets short winded easily. Cough productive of whittish sputum. He has no fever or chills. Exam unremarkable.  Assessment- most likely viral infection, doubt bacterial, possibly Flu.  Plan- Will get Chest xray - Guaifenasin-robitussin for cough  - reassured patient. - return precautions given, return to clinic if he still feels winded after 1-2 weeks, consider cardiac etiology, also blood work. Pt voiced understanding. - O2 sats with ambulation, >94%.  Chest xray without acute abnormality, patient informed of results.

## 2015-06-29 NOTE — Patient Instructions (Signed)
We will get a chest xray on you and let you know if you need an antibiotic.   I have prescribed an OTC medication for you to help with the cough, take it as needed only.  You most likely have a viral infection that is taking a while to clear. If you develop a fever, or worsening SOB please let us know about it.

## 2015-06-29 NOTE — Assessment & Plan Note (Signed)
Hgba1c today- 7.5. Pt has added 5 Lbs of weight since his last visit. He did not bring glucometer with him today. He stopped taking the liraglutide, after the initial few doses he was prescribed due to feeling of tachycardia and dizziness. Liraglutide can cause- tachycardia- up to 34% of pts, and dizziness -7%. He will like his Dm addressed at a subsequent visit.  He is currently taking Lantus, Metformin and actos, and complaint with them.

## 2015-06-29 NOTE — Progress Notes (Signed)
Patient ID: Adam [name], male   DOB: 12/04/1970, 45 y.o.   MRN: [id number]   Subjective:   Patient ID: Adam [name] male   DOB: 1970/12/29 45 y.o.   MRN: [id number]  HPI: Adam [name] is a 45 y.o. presented today with complaints of a congestion in his chest and head, for about 4 weeks. Hx of sick contacts- daughter who goes to school. No fever. Cough present, productive of yellow sputum, present throughout the day. Also has rhinorrhea- clear rhinorrhea. Noticed he gets winded easily now, started about 1 week ago. He uses 1 pillow to sleep. He had a sorethroat initially, that has resolved. He got the flu shot this year.  Never smoked cigarretes, chewed tobacco in the past.  Past Medical History  Diagnosis Date  . Diabetes mellitus without complication ([place])   . Hypertension   . Hyperlipidemia   . Allergy   . Depression   . Anxiety   . Sleep apnea    Review of Systems: CONSTITUTIONAL- No Fever, weightloss, night sweat or change in appetite. SKIN- No Rash, colour changes or itching. HEAD- No Headache or dizziness. Mouth/throat- No Sorethroat, dentures, or bleeding gums. CARDIAC- No Palpitations,  or chest pain. GI- No  vomiting, diarrhoea, abd pain. URINARY- No Frequency, or dysuria.  Objective:  Physical Exam: Filed Vitals:   06/29/15 0821  BP: 136/66  Pulse: 92  Temp: 97.6 F (36.4 C)  TempSrc: Oral  Height: 6' (1.829 m)  Weight: 310 lb 12.8 oz (140.978 kg)  SpO2: 95%   GENERAL- alert, co-operative, appears as stated age, not in any distress. HEENT- Atraumatic, normocephalic, PERRL,  oral mucosa appears moist, no pharyngeal erythema, no cervical LN enlargement, thyroid does not appear enlarged, neck supple, ruddy appearance to face and arms, says this is normal for him. CARDIAC- RRR, no murmurs, rubs or gallops. RESP- Moving equal volumes of air, and clear to auscultation bilaterally, no wheezes or crackles, O2 with ambulation >94% ABDOMEN- Soft, nontender,  no palpable masses or organomegaly, bowel sounds present. NEURO- No obvious Cr N abnormality, Gait- Normal. EXTREMITIES- warm, no pedal edema. PSYCH- Normal mood and affect, appropriate thought content and speech. Assessment & Plan:   The patient's case and plan of care was discussed with attending physician, Dr. Lalla Brothers.  Please see problem based charting for assessment and plan.

## 2015-07-01 NOTE — Progress Notes (Signed)
Internal Medicine Clinic Attending  Case discussed with Dr. Emokpae at the time of the visit.  We reviewed the resident's history and exam and pertinent patient test results.  I agree with the assessment, diagnosis, and plan of care documented in the resident's note.  

## 2015-07-13 MED FILL — LISINOPRIL 20 MG TABLET: 20 | 90 days supply | Qty: 90 | Fill #1

## 2015-07-13 MED FILL — metFORMIN HCL 1000 MG TABS: 1000 | 30 days supply | Qty: 60 | Fill #3

## 2015-08-05 DIAGNOSIS — H6691 Otitis media, unspecified, right ear: Secondary | ICD-10-CM | POA: Diagnosis not present

## 2015-08-05 DIAGNOSIS — J019 Acute sinusitis, unspecified: Secondary | ICD-10-CM | POA: Diagnosis not present

## 2015-08-05 MED FILL — METHYLPREDNISOLONE 4 MG TAB: 4 | 6 days supply | Qty: 21 | Fill #0

## 2015-08-05 MED FILL — CEFDINIR 300 MG CAPSULE: 300 | 10 days supply | Qty: 20 | Fill #0

## 2015-08-15 ENCOUNTER — Other Ambulatory Visit: Payer: Self-pay

## 2015-08-15 MED ORDER — METFORMIN HCL 1000 MG PO TABS: 1000.0000 mg | ORAL_TABLET | Freq: Two times a day (BID) | ORAL | Status: DC

## 2015-08-15 MED FILL — metFORMIN HCL 1000 MG TABS: 1000 | 45 days supply | Qty: 90 | Fill #0

## 2015-08-15 MED FILL — DULoxetine HCL 60 MG CPEP: 60 | 90 days supply | Qty: 90 | Fill #2

## 2015-08-15 MED FILL — PIOGLITAZONE HCL 45 MG TAB: 45 | 90 days supply | Qty: 90 | Fill #1

## 2015-08-17 MED FILL — LANTUS 100 UNITS/ML VIAL: 100 | 24 days supply | Qty: 10 | Fill #3

## 2015-08-29 MED FILL — CIALIS 20 MG TABLET: 20 | 30 days supply | Qty: 6 | Fill #2

## 2015-08-29 MED FILL — SIMVASTATIN 20 MG TABLET: 20 | 90 days supply | Qty: 90 | Fill #2

## 2015-09-05 ENCOUNTER — Other Ambulatory Visit: Payer: Self-pay | Admitting: Internal Medicine

## 2015-09-05 DIAGNOSIS — G4733 Obstructive sleep apnea (adult) (pediatric): Secondary | ICD-10-CM

## 2015-09-08 ENCOUNTER — Other Ambulatory Visit: Payer: Self-pay | Admitting: Student in an Organized Health Care Education/Training Program

## 2015-09-08 DIAGNOSIS — E119 Type 2 diabetes mellitus without complications: Secondary | ICD-10-CM

## 2015-09-08 NOTE — Telephone Encounter (Signed)
insulin glargine (LANTUS) 100 UNIT/ML injection [facility]

## 2015-09-09 MED ORDER — INSULIN GLARGINE 100 UNIT/ML ~~LOC~~ SOLN: 30.0000 [IU] | Freq: Every day | SUBCUTANEOUS | Status: DC

## 2015-09-09 MED FILL — LANTUS 100 UNITS/ML VIAL: 100 | 30 days supply | Qty: 10 | Fill #0

## 2015-09-28 MED FILL — metFORMIN HCL 1000 MG TABS: 1000 | 45 days supply | Qty: 90 | Fill #1

## 2015-10-03 DIAGNOSIS — G4733 Obstructive sleep apnea (adult) (pediatric): Secondary | ICD-10-CM | POA: Diagnosis not present

## 2015-10-17 MED FILL — LISINOPRIL 20 MG TABLET: 20 | 90 days supply | Qty: 90 | Fill #2

## 2015-10-17 MED FILL — LANTUS 100 UNITS/ML VIAL: 100 | 30 days supply | Qty: 10 | Fill #1

## 2015-10-18 ENCOUNTER — Other Ambulatory Visit: Payer: Self-pay | Admitting: Student in an Organized Health Care Education/Training Program

## 2015-10-18 MED ORDER — GLUCOSE BLOOD VI STRP: ORAL_STRIP | Status: DC

## 2015-10-18 MED FILL — ONE TOUCH ULTRA TEST STRIPS: 90 days supply | Qty: 400 | Fill #0

## 2015-10-18 NOTE — Telephone Encounter (Signed)
Needs refill of test strips

## 2015-11-11 MED FILL — CIALIS 20 MG TABLET: 20 | 30 days supply | Qty: 6 | Fill #3

## 2015-11-11 MED FILL — metFORMIN HCL 1000 MG TABS: 1000 | 45 days supply | Qty: 90 | Fill #2

## 2015-11-18 MED FILL — DULoxetine HCL 60 MG CPEP: 60 | 90 days supply | Qty: 90 | Fill #3

## 2015-11-21 ENCOUNTER — Encounter: Payer: Self-pay | Admitting: Student in an Organized Health Care Education/Training Program

## 2015-11-21 ENCOUNTER — Ambulatory Visit (INDEPENDENT_AMBULATORY_CARE_PROVIDER_SITE_OTHER): Payer: 59 | Admitting: Student in an Organized Health Care Education/Training Program

## 2015-11-21 VITALS — BP 144/75 | HR 77 | Temp 98.1°F | Ht 72.0 in | Wt 304.8 lb

## 2015-11-21 DIAGNOSIS — M6530 Trigger finger, unspecified finger: Secondary | ICD-10-CM

## 2015-11-21 DIAGNOSIS — E119 Type 2 diabetes mellitus without complications: Secondary | ICD-10-CM

## 2015-11-21 DIAGNOSIS — Z794 Long term (current) use of insulin: Secondary | ICD-10-CM

## 2015-11-21 DIAGNOSIS — I10 Essential (primary) hypertension: Secondary | ICD-10-CM | POA: Diagnosis not present

## 2015-11-21 DIAGNOSIS — L570 Actinic keratosis: Secondary | ICD-10-CM | POA: Insufficient documentation

## 2015-11-21 DIAGNOSIS — L988 Other specified disorders of the skin and subcutaneous tissue: Secondary | ICD-10-CM

## 2015-11-21 DIAGNOSIS — M65311 Trigger thumb, right thumb: Secondary | ICD-10-CM

## 2015-11-21 LAB — POCT GLYCOSYLATED HEMOGLOBIN (HGB A1C): Hemoglobin A1C: 6.7

## 2015-11-21 LAB — GLUCOSE, CAPILLARY: GLUCOSE-CAPILLARY: 202 mg/dL — AB (ref 65–99)

## 2015-11-21 NOTE — Assessment & Plan Note (Signed)
Blood pressure moderately well controlled currently. He has lost 6 lbs in the last few months. Plan to continue lisinopril 20mg  daily and I encouraged more exercise, nutrition, and weight loss.

## 2015-11-21 NOTE — Assessment & Plan Note (Signed)
A1c is at goal today at 6.8%. I would like to replace insulin at some point with a novel agent due to weight gain for the last several years. He did not tolerate GLP1 agonist due to dizziness. Plan to continue Lantus 30 units nightly, metformin and actos for now. Follow up in 3 months and we will talk about switching lantus to an SGLT2 inhibitor. Foot exam normal today.

## 2015-11-21 NOTE — Patient Instructions (Signed)
Actinic Keratosis Actinic keratosis is a precancerous growth on the skin. This means it could develop into skin cancer if it is not treated. About 1% of actinic keratoses turn into skin cancer within a year. It is important to have all such growths removed to prevent them from developing into skin cancer. CAUSES  Actinic keratosis is caused by getting too much ultraviolet (UV) radiation from the sun or other UV light sources. RISK FACTORS Factors that increase your chances of getting actinic keratosis include:  Having light-colored skin and blue eyes.  Having blonde or red hair.  Spending a lot of time in the sun.  Age. The risk of actinic keratosis increases with age. SYMPTOMS  Actinic keratosis growths look like scaly, rough spots of skin. They can be as small as a pinhead or as big as a quarter. They may itch, hurt, or feel sensitive. Sometimes there is a little tag of pink or gray skin growing off them. In some cases, actinic keratoses are easier felt than seen. They do not go away with the use of moisturizing lotions or creams. Actinic keratoses appear most often on areas of skin that get a lot of sun exposure. These areas include the:  Scalp.  Face.  Ears.  Lips.  Upper back.  Backs of the hands.  Forearms. DIAGNOSIS  Your health care provider can usually tell what is wrong by performing a physical exam. A tissue sample (biopsy) may also be taken and examined under a microscope. TREATMENT  Actinic keratosis can be treated several ways. Most treatments can be done in your health care provider's office. Treatment options may include:  Curettage. A tool is used to gently scrape off the growth.  Cryosurgery. Liquid nitrogen is applied to the growth to freeze it. The growth eventually falls off the skin.  Medicated creams, such as 5-fluorouracil or imiquimod. The medicine destroys the cells in the growth.  Chemical peels. Chemicals are applied to the growth and the outer  layers of skin are peeled off.  Photodynamic therapy. A drug that makes your skin more sensitive to light is applied to the skin. A strong, blue light is aimed at the skin and destroys the growth. PREVENTION  To prevent future sun damage:  Try to avoid the sun between 10:00 a.m. and 4:00 p.m. when it is the strongest.  Use a sunscreen or sunblock with SPF 30 or greater.  Apply sunscreen at least 30 minutes before exposure to the sun.  Always wear protective hats, clothing, and sunglasses with UV protection.  Avoid medicines, herbs, and foods that increase your sensitivity to sunlight.  Avoid tanning beds. HOME CARE INSTRUCTIONS   If your skin was covered with a bandage, change and remove the bandage as directed by your health care provider.  Keep the treated area dry as directed by your health care provider.  Apply any creams as prescribed by your health care provider. Follow the directions carefully.  Check your skin regularly for any changes.  Visit a skin doctor (dermatologist) every year for a skin exam. SEEK MEDICAL CARE IF:   Your skin does not heal and becomes irritated, red, or bleeds.  You notice any changes or new growths on your skin.   This information is not intended to replace advice given to you by your health care provider. Make sure you discuss any questions you have with your health care provider.   Document Released: 07/06/2008 Document Revised: 04/30/2014 Document Reviewed: 05/21/2011 Elsevier Interactive Patient Education 2016 Elsevier   Inc.  

## 2015-11-21 NOTE — Progress Notes (Signed)
Assessment and Plan:  See Encounters tab for problem-based medical decision making.   __________________________________________________________  HPI:  45 year old man comes in today for follow-up of diabetes. Currently he uses Lantus about 30 units every night. He says that he has been adjusting it based on his sugars from the day. He has been as high as 58 units nightly several months ago. He's been on insulin for about 6 years now and has noticed weight gain is results. 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 As a result he feels like he is eating less road food, less fast food. He tells me he brings his lunch into work every day. He thinks is a little bit more sedentary than usual now. He reports losing some weight but was a little disheartened here was only 6 pounds. He says that his weight was normally around 260 pounds before starting insulin. No chest pain or shortness of breath, does well with exertion. Mood is stable, all as well as home. Doing well with his current medications without significant side effect. Our last visit we had tried to start the [place] tired but is unable to tolerate this because of fast heart rate and dizziness even at the very low dose.  He also did point out a small skin lesion on his right arm, says it's been present for about 2 years. Denies any recent changes to it. It doesn't hurt. It doesn't bleed. No personal history of skin cancer.  __________________________________________________________  Problem List: Patient Active Problem List   Diagnosis Date Noted  . Diabetes ([place]) 01/06/2015    Priority: High  . Obstructive sleep apnea 02/28/2015    Priority: Medium  . Essential hypertension 01/06/2015    Priority: Medium  . Actinic keratosis 11/21/2015    Priority: Low  . Erectile dysfunction 01/06/2015    Priority: Low  . Trigger finger 01/06/2015    Priority: Low  . Healthcare maintenance 01/06/2015      Priority: Low    Medications: Reconciled today in Epic __________________________________________________________  Physical Exam:  Vital Signs: Vitals:   11/21/15 0819  BP: (!) 144/75  Pulse: 77  Temp: 98.1 F (36.7 C)  TempSrc: Oral  SpO2: 96%  Weight: (!) 304 lb 12.8 oz (138.3 kg)  Height: 6' (1.829 m)    Gen: Well appearing, NAD Neck: No cervical LAD, No thyromegaly or nodules, No JVD. CV: RRR, no murmurs Pulm: Normal effort, CTA throughout, no wheezing Ext: Warm, no edema, normal joints Skin: Right arm with a 1cm flesh colored AK on the forearm. Back has numerous moles and macules, none appear dysplastic. Right leg has a 1cm hypertophic nodule, appears keloid-like.

## 2015-11-21 NOTE — Assessment & Plan Note (Signed)
Right thumb flexor tenosynovitis was treated last November with steroid injection in our office. He's had good benefit and is noticing soreness recurring last few weeks. I told him if it starts to lock again we can do another steroid injection into the tendon sheath. He will follow-up as needed.

## 2015-11-21 NOTE — Assessment & Plan Note (Signed)
Exam consistent with a likely actinic keratosis on his right arm present for several years. Seems low risk for a squamous cell or basal cell carcinoma. I treated it today in the office with cryotherapy.  Procedure Note:  Liquid nitrogen was applied for 10-12 seconds to the skin lesions and the expected blistering or scabbing reaction explained. Do not pick at the areas. Patient reminded to expect hypopigmented scars from the procedure. Return if lesions fail to fully resolve.

## 2015-11-22 LAB — HIV ANTIBODY (ROUTINE TESTING W REFLEX): HIV SCREEN 4TH GENERATION: NONREACTIVE

## 2015-11-22 LAB — BMP8+ANION GAP
ANION GAP: 16 mmol/L (ref 10.0–18.0)
BUN/Creatinine Ratio: 15 (ref 9–20)
BUN: 12 mg/dL (ref 6–24)
CALCIUM: 9.4 mg/dL (ref 8.7–10.2)
CHLORIDE: 100 mmol/L (ref 96–106)
CO2: 23 mmol/L (ref 18–29)
Creatinine, Ser: 0.78 mg/dL (ref 0.76–1.27)
GFR calc Af Amer: 124 mL/min/{1.73_m2} (ref >59)
GFR calc non Af Amer: 107 mL/min/{1.73_m2} (ref >59)
GLUCOSE: 187 mg/dL — AB (ref 65–99)
POTASSIUM: 4.4 mmol/L (ref 3.5–5.2)
Sodium: 139 mmol/L (ref 134–144)

## 2015-11-23 ENCOUNTER — Encounter: Payer: Self-pay | Admitting: Student in an Organized Health Care Education/Training Program

## 2015-12-12 MED FILL — SIMVASTATIN 20 MG TABLET: 20 | 90 days supply | Qty: 90 | Fill #3

## 2015-12-12 MED FILL — PIOGLITAZONE HCL 45 MG TAB: 45 | 90 days supply | Qty: 90 | Fill #2

## 2015-12-29 MED FILL — metFORMIN HCL 1000 MG TABS: 1000 | 45 days supply | Qty: 90 | Fill #3

## 2015-12-30 ENCOUNTER — Ambulatory Visit (INDEPENDENT_AMBULATORY_CARE_PROVIDER_SITE_OTHER): Payer: 59 | Admitting: Internal Medicine

## 2015-12-30 VITALS — BP 140/80 | HR 102 | Temp 98.0°F | Ht 73.0 in | Wt 308.7 lb

## 2015-12-30 DIAGNOSIS — M25541 Pain in joints of right hand: Secondary | ICD-10-CM

## 2015-12-30 DIAGNOSIS — M25512 Pain in left shoulder: Secondary | ICD-10-CM | POA: Diagnosis not present

## 2015-12-30 DIAGNOSIS — M67912 Unspecified disorder of synovium and tendon, left shoulder: Secondary | ICD-10-CM | POA: Insufficient documentation

## 2015-12-30 MED ORDER — DICLOFENAC SODIUM 1 % TD GEL: 4.0000 g | Freq: Four times a day (QID) | TRANSDERMAL | 0 refills | Status: DC

## 2015-12-30 NOTE — Progress Notes (Signed)
   CC: here for left shoulder pain and right hand pain  HPI:  [name] is a 45 y.o. man with a past medical history listed below here today for pain in his left shoulder and in his right 2nd MCP.   Left Shoulder Pain: History of injury about 15 years ago with surgery afterwards described as "stabilization surgery".  Reports that this is more of a chronic issue for him but recently has been keeping him up at night when sleeping on it.  Reports his shoulder "pops and grinds" and is likely related to arthritis from his past surgery and work history.  He has had no recent fall or trauma and no recent imaging of his shoulder joint.  Right 2nd MCP Pain: He states this is a problem he has had for long time but over the past 2 weeks has been more painful.  He has a history of right thumb tenosynovitis with steroid injection last year by his PCP.  X-ray around that time in Sept 2016 was unremarkable.  States this joint is always hurting and he is unsure of what recently may have aggravated it.  It is described as a deeper, gnawing pain that is exacerbated by index finger extension.  He has not had a recent injury or trauma and denies any fever, chills, warmth or erythema.  He has been using tylenol and aleve with no relief of symptoms.  For details of today's visit and the status of his chronic medical issues please refer to the assessment and plan.   Past Medical History:  Diagnosis Date  . Allergy   . Anxiety   . Depression   . Diabetes mellitus without complication ([place])   . Hyperlipidemia   . Hypertension   . Sleep apnea     Review of Systems:   Review of Systems  Constitutional: Negative for chills and fever.  Gastrointestinal: Negative for nausea and vomiting.  Musculoskeletal: Positive for joint pain (left shoulder, 2nd right MCP).  Skin: Negative for rash.     Physical Exam:  Vitals:   12/30/15 1459  BP: 140/80  Pulse: (!) 102  Temp: 98 F (36.7 C)  TempSrc: Oral   SpO2: 96%  Weight: (!) 308 lb 11.2 oz (140 kg)  Height: 6\' 1"  (1.854 m)   Physical Exam  Constitutional: He is well-developed, well-nourished, and in no distress.  HENT:  Head: Normocephalic and atraumatic.  Neck: Normal range of motion. Neck supple.  Musculoskeletal:  Right 2nd MCP with no effusion or deformity but does have mild fullness.  Normal ROM with making a fist.  Intact pulses and brisk capillary refill. Left shoulder with limited ROM in flexion and discomfort above 90 degrees with some crepitus noted as well.  No other obvious deformity.  Normal strength with empty can test although did elicit some discomfort in his shoulder joint.  Normal grip strength and normal strength with flexion and extension.  Skin: Skin is warm and dry. No erythema.    Assessment & Plan:   See Encounters Tab for problem based charting.  Patient discussed with Dr. Daryll Drown.

## 2015-12-30 NOTE — Patient Instructions (Signed)
Thank you for coming to see me today. It was a pleasure. Today we talked about:   Right Hand Pain: - I have given you a prescription for topical pain medication to try - Please also use ice to treat some inflammation  Left Shoulder Pain: - You can use the same topical pain medications   Please follow-up with [place] next week (MON, TUES, THURS) for injection therapy if this is not working.  If you have any questions or concerns, please do not hesitate to call the office at [phone].  Take Care,   Jule Ser, DO

## 2015-12-30 NOTE — Assessment & Plan Note (Signed)
A: He states this is a problem he has had for long time but over the past 2 weeks has been more painful.  He has a history of right thumb tenosynovitis with steroid injection last year by his PCP.  X-ray around that time in Sept 2016 was unremarkable.  States this joint is always hurting and he is unsure of what recently may have aggravated it.  It is described as a deeper, gnawing pain that is exacerbated by index finger extension.  He has not had a recent injury or trauma and denies any fever, chills, warmth or erythema.  He has been using tylenol and aleve with no relief of symptoms.  P: - trial of topical Voltaren gel over right 2nd MCP joint
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
## 2016-01-02 NOTE — Progress Notes (Signed)
Internal Medicine Clinic Attending  Case discussed with Dr. Wallace at the time of the visit.  We reviewed the resident's history and exam and pertinent patient test results.  I agree with the assessment, diagnosis, and plan of care documented in the resident's note.  

## 2016-01-12 ENCOUNTER — Other Ambulatory Visit: Payer: Self-pay | Admitting: Student in an Organized Health Care Education/Training Program

## 2016-01-12 DIAGNOSIS — I10 Essential (primary) hypertension: Secondary | ICD-10-CM

## 2016-01-12 MED FILL — CIALIS 20 MG TABLET: 20 | 30 days supply | Qty: 6 | Fill #4

## 2016-01-12 MED FILL — LISINOPRIL 20 MG TABLET: 20 | 90 days supply | Qty: 90 | Fill #0

## 2016-01-13 MED FILL — LANTUS 100 UNITS/ML VIAL: 100 | 30 days supply | Qty: 10 | Fill #2

## 2016-01-21 DIAGNOSIS — S7002XA Contusion of left hip, initial encounter: Secondary | ICD-10-CM | POA: Diagnosis not present

## 2016-01-21 DIAGNOSIS — S2242XA Multiple fractures of ribs, left side, initial encounter for closed fracture: Secondary | ICD-10-CM | POA: Diagnosis not present

## 2016-01-21 DIAGNOSIS — M62838 Other muscle spasm: Secondary | ICD-10-CM | POA: Diagnosis not present

## 2016-01-23 ENCOUNTER — Other Ambulatory Visit: Payer: Self-pay | Admitting: Student in an Organized Health Care Education/Training Program

## 2016-01-24 ENCOUNTER — Ambulatory Visit (INDEPENDENT_AMBULATORY_CARE_PROVIDER_SITE_OTHER): Payer: 59 | Admitting: Internal Medicine

## 2016-01-24 ENCOUNTER — Encounter: Payer: Self-pay | Admitting: Internal Medicine

## 2016-01-24 DIAGNOSIS — M25512 Pain in left shoulder: Secondary | ICD-10-CM | POA: Diagnosis not present

## 2016-01-24 DIAGNOSIS — R0781 Pleurodynia: Secondary | ICD-10-CM | POA: Diagnosis not present

## 2016-01-24 DIAGNOSIS — M542 Cervicalgia: Secondary | ICD-10-CM

## 2016-01-24 MED ORDER — HYDROCODONE-ACETAMINOPHEN 5-325 MG PO TABS: 1.0000 | ORAL_TABLET | ORAL | 0 refills | Status: DC | PRN

## 2016-01-24 MED FILL — HYDROCODON-APAP 5-325: 5-325 | 5 days supply | Qty: 30 | Fill #0

## 2016-01-24 NOTE — Patient Instructions (Addendum)
Adam [name] it was nice meeting you today.  -STOP taking Tramadol  -Take Norco as prescribed for your pain.  -Continue taking Flexeril as prescribed for muscle spasms.  -Return to the clinic in 1 week or sooner if your pain does not improve or becomes worse.
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
## 2016-01-25 NOTE — Progress Notes (Signed)
Internal Medicine Clinic Attending  Case discussed with Dr. Rathoreat the time of the visit. We reviewed the resident's history and exam and pertinent patient test results. I agree with the assessment, diagnosis, and plan of care documented in the resident's note.  

## 2016-01-27 ENCOUNTER — Ambulatory Visit (INDEPENDENT_AMBULATORY_CARE_PROVIDER_SITE_OTHER): Payer: 59 | Admitting: Internal Medicine

## 2016-01-27 DIAGNOSIS — M62838 Other muscle spasm: Secondary | ICD-10-CM | POA: Diagnosis not present
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
## 2016-01-27 NOTE — Patient Instructions (Signed)
From our exam and from what you've been able to tell us about your pain, it is most likely due to a muscle spasm. I expect it to get better with time and some supportive therapy.   Keep using heat to relax the muscle Keep using the aleve 2 pills twice a day Keep using the muscle relaxant (flexeril) The above will work directly on the muscle spasm itself and should provide relief  And you can use the pain medicine Norco if you need it.  Enjoy your weekend!

## 2016-01-30 ENCOUNTER — Other Ambulatory Visit: Payer: Self-pay | Admitting: Student in an Organized Health Care Education/Training Program

## 2016-01-30 NOTE — Telephone Encounter (Signed)
Pt requesting a refill on   HYDROcodone-acetaminophen (NORCO) 5-325 MG tablet
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
## 2016-01-30 NOTE — Telephone Encounter (Signed)
After reading note from last visit called pt, made an appt for tomorrow in [facility] for re- eval for refill

## 2016-01-31 ENCOUNTER — Ambulatory Visit (INDEPENDENT_AMBULATORY_CARE_PROVIDER_SITE_OTHER): Payer: 59 | Admitting: Internal Medicine

## 2016-01-31 VITALS — BP 147/86 | HR 87 | Temp 98.1°F | Wt 312.4 lb

## 2016-01-31 DIAGNOSIS — M62838 Other muscle spasm: Secondary | ICD-10-CM | POA: Diagnosis not present

## 2016-01-31 NOTE — Patient Instructions (Addendum)
Aleve 500mg  twice a day  Use tennis ball or similar to lay on to stretch out the muscles in your back that are tight.  Continue with the heat; while at work you can use the stick on heating pad you can get at a pharmacy  Continue with Flexeril as needed.  I'm glad that your pain is improving!
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
## 2016-02-01 NOTE — Assessment & Plan Note (Addendum)
Patient with signs and symptoms consistent with muscle spasm of parascapular musculature showing improvement from 10/6.   Plan: --no indication for continued opioid therapy. --apply heat --Aleve OTC 2 pills BID --continue flexeril --stretch area with tennis ball or similar

## 2016-02-06 NOTE — Progress Notes (Signed)
I saw and evaluated the patient. I personally confirmed the key portions of Dr. Svalina's history and exam and reviewed pertinent patient test results. The assessment, diagnosis, and plan were formulated together and I agree with the documentation in the resident's note. 

## 2016-02-14 ENCOUNTER — Other Ambulatory Visit: Payer: Self-pay | Admitting: Internal Medicine

## 2016-02-14 MED FILL — metFORMIN HCL 1000 MG TABS: 1000 | 45 days supply | Qty: 90 | Fill #0

## 2016-02-27 MED FILL — LANTUS 100 UNITS/ML VIAL: 100 | 30 days supply | Qty: 10 | Fill #3

## 2016-03-05 ENCOUNTER — Other Ambulatory Visit: Payer: Self-pay | Admitting: Student in an Organized Health Care Education/Training Program

## 2016-03-05 DIAGNOSIS — E119 Type 2 diabetes mellitus without complications: Secondary | ICD-10-CM

## 2016-03-05 DIAGNOSIS — N529 Male erectile dysfunction, unspecified: Secondary | ICD-10-CM

## 2016-03-05 DIAGNOSIS — Z794 Long term (current) use of insulin: Principal | ICD-10-CM

## 2016-03-05 MED FILL — SIMVASTATIN 20 MG TABLET: 20 | 60 days supply | Qty: 60 | Fill #4

## 2016-03-05 MED FILL — CIALIS 20 MG TABLET: 20 | 30 days supply | Qty: 6 | Fill #0

## 2016-03-05 MED FILL — PIOGLITAZONE HCL 45 MG TAB: 45 | 90 days supply | Qty: 90 | Fill #0

## 2016-03-05 NOTE — Telephone Encounter (Signed)
Yes, he should have an appointment with me every three months. Thanks!

## 2016-03-09 ENCOUNTER — Telehealth: Payer: Self-pay | Admitting: Student in an Organized Health Care Education/Training Program

## 2016-03-09 DIAGNOSIS — M25512 Pain in left shoulder: Principal | ICD-10-CM

## 2016-03-09 DIAGNOSIS — M62838 Other muscle spasm: Secondary | ICD-10-CM

## 2016-03-09 DIAGNOSIS — G8929 Other chronic pain: Secondary | ICD-10-CM

## 2016-03-09 NOTE — Telephone Encounter (Signed)
Patient is requesting a Pain Clinic referral for his Shoulder Pain.  Pt states that we have rec'd all of his records now from [facility] and we should be able to refer him to a new one.  Please advise.

## 2016-03-12 NOTE — Telephone Encounter (Signed)
I have ordered the referral. Thank you.

## 2016-03-23 ENCOUNTER — Ambulatory Visit (INDEPENDENT_AMBULATORY_CARE_PROVIDER_SITE_OTHER): Payer: 59 | Admitting: Internal Medicine

## 2016-03-23 VITALS — BP 148/81 | HR 90 | Temp 98.0°F | Ht 72.0 in | Wt 304.9 lb

## 2016-03-23 DIAGNOSIS — M545 Low back pain: Secondary | ICD-10-CM

## 2016-03-23 DIAGNOSIS — Z981 Arthrodesis status: Secondary | ICD-10-CM

## 2016-03-23 DIAGNOSIS — G8929 Other chronic pain: Secondary | ICD-10-CM | POA: Diagnosis not present

## 2016-03-23 DIAGNOSIS — M542 Cervicalgia: Secondary | ICD-10-CM

## 2016-03-23 DIAGNOSIS — M62838 Other muscle spasm: Secondary | ICD-10-CM

## 2016-03-23 DIAGNOSIS — M25512 Pain in left shoulder: Secondary | ICD-10-CM

## 2016-03-23 MED ORDER — HYDROCODONE-ACETAMINOPHEN 7.5-325 MG PO TABS: 1.0000 | ORAL_TABLET | Freq: Four times a day (QID) | ORAL | 0 refills | Status: DC | PRN

## 2016-03-23 MED ORDER — CYCLOBENZAPRINE HCL 5 MG PO TABS: 5.0000 mg | ORAL_TABLET | Freq: Three times a day (TID) | ORAL | 0 refills | Status: DC | PRN

## 2016-03-23 MED FILL — HYDROCODON-APAP 7.5-325: 7.5-325 | 8 days supply | Qty: 30 | Fill #0

## 2016-03-23 MED FILL — CYCLOBENZAPRINE 5 MG TABLET: 5 | 10 days supply | Qty: 30 | Fill #0
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
## 2016-03-23 NOTE — Patient Instructions (Signed)
Please continue to take your medications as prescribed.  We have provided a short term prescription for Norco and Flexeril, as well as a referral to physical therapy.  Losing weight is the most important thing to stop your arthritis from getting worse.  For your sinus difficulties you can try nasal irrigation (with Neti-pot of something similar).
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
## 2016-03-26 NOTE — Progress Notes (Signed)
Internal Medicine Clinic Attending  I saw and evaluated the patient.  I personally confirmed the key portions of the history and exam documented by Dr. Johnson and I reviewed pertinent patient test results.  The assessment, diagnosis, and plan were formulated together and I agree with the documentation in the resident's note.  

## 2016-03-30 MED FILL — metFORMIN HCL 1000 MG TABS: 1000 | 45 days supply | Qty: 90 | Fill #1

## 2016-04-02 ENCOUNTER — Ambulatory Visit: Payer: 59 | Attending: Pediatrics | Admitting: Physical Therapy

## 2016-04-02 ENCOUNTER — Encounter: Payer: Self-pay | Admitting: Physical Therapy

## 2016-04-02 DIAGNOSIS — G8929 Other chronic pain: Secondary | ICD-10-CM | POA: Diagnosis not present

## 2016-04-02 DIAGNOSIS — M25512 Pain in left shoulder: Secondary | ICD-10-CM | POA: Insufficient documentation

## 2016-04-02 DIAGNOSIS — M62830 Muscle spasm of back: Secondary | ICD-10-CM | POA: Insufficient documentation

## 2016-04-02 DIAGNOSIS — M545 Low back pain, unspecified: Secondary | ICD-10-CM

## 2016-04-02 DIAGNOSIS — M542 Cervicalgia: Secondary | ICD-10-CM | POA: Diagnosis not present
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
## 2016-04-09 ENCOUNTER — Encounter: Payer: Self-pay | Admitting: Student in an Organized Health Care Education/Training Program

## 2016-04-09 ENCOUNTER — Ambulatory Visit: Payer: 59 | Admitting: Student in an Organized Health Care Education/Training Program

## 2016-04-09 DIAGNOSIS — M47816 Spondylosis without myelopathy or radiculopathy, lumbar region: Secondary | ICD-10-CM | POA: Insufficient documentation

## 2016-04-10 ENCOUNTER — Ambulatory Visit: Payer: 59 | Admitting: Physical Therapy

## 2016-04-17 MED FILL — LANTUS 100 UNITS/ML VIAL: 100 | 30 days supply | Qty: 10 | Fill #4

## 2016-04-17 MED FILL — DULoxetine HCL 60 MG CPEP: 60 | 90 days supply | Qty: 90 | Fill #0

## 2016-04-17 MED FILL — LISINOPRIL 20 MG TABLET: 20 | 90 days supply | Qty: 90 | Fill #1

## 2016-04-18 ENCOUNTER — Ambulatory Visit: Payer: 59 | Admitting: Physical Therapy

## 2016-04-18 ENCOUNTER — Encounter: Payer: Self-pay | Admitting: Physical Therapy

## 2016-04-18 DIAGNOSIS — G8929 Other chronic pain: Secondary | ICD-10-CM

## 2016-04-18 DIAGNOSIS — M62830 Muscle spasm of back: Secondary | ICD-10-CM

## 2016-04-18 DIAGNOSIS — M542 Cervicalgia: Secondary | ICD-10-CM

## 2016-04-18 DIAGNOSIS — M25512 Pain in left shoulder: Secondary | ICD-10-CM

## 2016-04-18 DIAGNOSIS — M545 Low back pain, unspecified: Secondary | ICD-10-CM

## 2016-04-18 NOTE — Therapy (Signed)
[address] [place], [place], [address] Phone: [phone]   Fax:  [phone]  Physical Therapy Treatment  Patient Details  Name: Adam [name] MRN: [id number] Date of Birth: [birth year] Referring Provider: Asencion Partridge  Encounter Date: 04/18/2016      PT End of Session - 04/18/16 1727    Visit Number 2   Date for PT Re-Evaluation 06/03/16   PT Start Time 0450   PT Stop Time 0540   PT Time Calculation (min) 50 min   Activity Tolerance Patient tolerated treatment well;No increased pain   Behavior During Therapy WFL for tasks assessed/performed      Past Medical History:  Diagnosis Date  . Allergy   . Depression   . Diabetes mellitus without complication ([place])   . Hyperlipidemia   . Hypertension   . Sleep apnea     Past Surgical History:  Procedure Laterality Date  . CARPAL TUNNEL RELEASE  April 2016  . SHOULDER SURGERY    . SPINE SURGERY     Spinal L5-S1    There were no vitals filed for this visit.      Subjective Assessment - 04/18/16 1650    Subjective Pt reports he has been feeling "better" since the initial evaluation. He states that the e-stim seemd to really help his pain.   Currently in Pain? Yes   Pain Score 6    Pain Location Back                         [facility] Adult PT Treatment/Exercise - 04/18/16 0001      Exercises   Exercises Lumbar;Shoulder;Neck     Neck Exercises: Machines for Strengthening   UBE (Upper Arm Bike) 3 min fwd, 3 min back lvl 2     Neck Exercises: Standing   Neck Retraction 10 reps;3 secs  2 sets, green ball   Neck Retraction Limitations "feel it a little in the low back"     Shoulder Exercises: Standing   Extension Strengthening;Both;10 reps;Theraband   Theraband Level (Shoulder Extension) Level 3 (Green)   Row Strengthening;Both;10 reps;Theraband   Theraband Level (Shoulder Row) Level 3 (Green)   Other Standing Exercises  lat pulldowns 2x10  25 lb, 35 lb     Modalities   Modalities Moist Heat;Electrical Stimulation     Moist Heat Therapy   Number Minutes Moist Heat 15 Minutes   Moist Heat Location Cervical;Lumbar Spine     Electrical Stimulation   Electrical Stimulation Location C/T, lumbar   Electrical Stimulation Action IFC   Electrical Stimulation Parameters supine   Electrical Stimulation Goals Pain     Manual Therapy   Manual Therapy Passive ROM   Passive ROM upper trap stretch, levator scap stretch                PT Education - 04/18/16 1726    Education provided Yes   Education Details HEP: upper trap stretch, levator stretch, band exercises   Person(s) Educated Patient   Methods Explanation;Handout   Comprehension Verbalized understanding          PT Short Term Goals - 04/18/16 1730      PT SHORT TERM GOAL #1   Status Achieved           PT Long Term Goals - 04/18/16 1730      PT LONG TERM GOAL #1   Status On-going     PT  LONG TERM GOAL #2   Status On-going     PT LONG TERM GOAL #3   Status On-going               Plan - 04/18/16 1727    Clinical Impression Statement Pt tolerated treatment well and was able to complete all exercises without increased pain. He does report some discomfort in the low back with standing exercises. Pt needed minimal cueing in order to complete exercises. Pt was given HEP for upper trap and levator stretches as well as shoulder strengthening with tband. Progress per pt tolerance.   Rehab Potential Good   PT Frequency 2x / week   PT Duration 8 weeks   PT Treatment/Interventions ADLs/Self Care Home Management;Electrical Stimulation;Iontophoresis 4mg /ml Dexamethasone;Ultrasound;Moist Heat;Therapeutic activities;Therapeutic exercise;Neuromuscular re-education;Patient/family education;Manual techniques   PT Next Visit Plan Scap stabilization, shoulder strengthening, neck mobility, pain management   Consulted and Agree with Plan  of Care Patient      Patient will benefit from skilled therapeutic intervention in order to improve the following deficits and impairments:  Decreased activity tolerance, Decreased range of motion, Decreased strength, Increased muscle spasms, Impaired flexibility, Postural dysfunction, Improper body mechanics, Pain  Visit Diagnosis: Cervicalgia  Chronic left shoulder pain  Chronic midline low back pain without sciatica  Muscle spasm of back     Problem List Patient Active Problem List   Diagnosis Date Noted  . Osteoarthritis of lumbar spine 04/09/2016  . Tendinopathy of left rotator cuff 12/30/2015  . Obstructive sleep apnea 02/28/2015  . Diabetes ([place]) 01/06/2015  . Erectile dysfunction 01/06/2015  . Trigger finger 01/06/2015  . Healthcare maintenance 01/06/2015  . Essential hypertension 01/06/2015    Toy Baker, SPT 04/18/2016, 5:30 PM  [address] [place], [place], [address] Phone: [phone]   Fax:  [phone]  Name: Loid Zolnowski MRN: [id number] Date of Birth: March 30, 1971

## 2016-04-25 ENCOUNTER — Other Ambulatory Visit: Payer: Self-pay | Admitting: *Deleted

## 2016-04-25 DIAGNOSIS — N529 Male erectile dysfunction, unspecified: Secondary | ICD-10-CM

## 2016-04-25 MED ORDER — TADALAFIL 20 MG PO TABS: ORAL_TABLET | ORAL | 0 refills | Status: DC

## 2016-04-25 MED FILL — CIALIS 20 MG TABLET: 20 | 30 days supply | Qty: 6 | Fill #0

## 2016-04-25 NOTE — Telephone Encounter (Signed)
Per pharmacy-original rx for #10, but pt's insurance will only pay for #6 a mth.Adam [name], Adam Leiker Cassady1/3/20188:50 AM

## 2016-05-02 ENCOUNTER — Ambulatory Visit: Payer: 59 | Admitting: Physical Therapy

## 2016-05-04 ENCOUNTER — Telehealth: Payer: Self-pay

## 2016-05-04 NOTE — Telephone Encounter (Signed)
Pt is calling back, requesting to speak with a nurse.

## 2016-05-04 NOTE — Telephone Encounter (Signed)
Called pt back gave him dr vincent's answer

## 2016-05-04 NOTE — Telephone Encounter (Signed)
Pt is calling back again. Please call pt back.

## 2016-05-04 NOTE — Telephone Encounter (Signed)
HYDROcodone-acetaminophen (NORCO) 7.5-325 MG tablet, refill request.  

## 2016-05-04 NOTE — Telephone Encounter (Signed)
Patient takes Norco rarely and I would prefer to wait on refills until our office visit on 1/22 if possible. Please let me know if there is some new pain generator that I am not aware of.

## 2016-05-08 DIAGNOSIS — E119 Type 2 diabetes mellitus without complications: Secondary | ICD-10-CM | POA: Diagnosis not present

## 2016-05-08 DIAGNOSIS — H01021 Squamous blepharitis right upper eyelid: Secondary | ICD-10-CM | POA: Diagnosis not present

## 2016-05-08 DIAGNOSIS — H01024 Squamous blepharitis left upper eyelid: Secondary | ICD-10-CM | POA: Diagnosis not present

## 2016-05-08 LAB — HM DIABETES EYE EXAM

## 2016-05-14 ENCOUNTER — Ambulatory Visit (INDEPENDENT_AMBULATORY_CARE_PROVIDER_SITE_OTHER): Payer: 59 | Admitting: Student in an Organized Health Care Education/Training Program

## 2016-05-14 ENCOUNTER — Encounter: Payer: Self-pay | Admitting: Student in an Organized Health Care Education/Training Program

## 2016-05-14 VITALS — BP 138/88 | HR 74 | Temp 98.0°F | Ht 72.0 in | Wt 313.2 lb

## 2016-05-14 DIAGNOSIS — Z981 Arthrodesis status: Secondary | ICD-10-CM

## 2016-05-14 DIAGNOSIS — Z794 Long term (current) use of insulin: Secondary | ICD-10-CM

## 2016-05-14 DIAGNOSIS — E1165 Type 2 diabetes mellitus with hyperglycemia: Secondary | ICD-10-CM | POA: Diagnosis not present

## 2016-05-14 DIAGNOSIS — E119 Type 2 diabetes mellitus without complications: Secondary | ICD-10-CM

## 2016-05-14 DIAGNOSIS — Z79899 Other long term (current) drug therapy: Secondary | ICD-10-CM

## 2016-05-14 DIAGNOSIS — M47816 Spondylosis without myelopathy or radiculopathy, lumbar region: Secondary | ICD-10-CM

## 2016-05-14 DIAGNOSIS — Z87891 Personal history of nicotine dependence: Secondary | ICD-10-CM

## 2016-05-14 DIAGNOSIS — M5136 Other intervertebral disc degeneration, lumbar region: Secondary | ICD-10-CM | POA: Diagnosis not present

## 2016-05-14 DIAGNOSIS — N529 Male erectile dysfunction, unspecified: Secondary | ICD-10-CM

## 2016-05-14 DIAGNOSIS — Z79891 Long term (current) use of opiate analgesic: Secondary | ICD-10-CM

## 2016-05-14 DIAGNOSIS — I10 Essential (primary) hypertension: Secondary | ICD-10-CM | POA: Diagnosis not present

## 2016-05-14 LAB — POCT GLYCOSYLATED HEMOGLOBIN (HGB A1C): Hemoglobin A1C: 9

## 2016-05-14 LAB — GLUCOSE, CAPILLARY: Glucose-Capillary: 213 mg/dL — ABNORMAL HIGH (ref 65–99)

## 2016-05-14 MED ORDER — HYDROCODONE-ACETAMINOPHEN 7.5-325 MG PO TABS: 1.0000 | ORAL_TABLET | Freq: Four times a day (QID) | ORAL | 0 refills | Status: DC | PRN

## 2016-05-14 MED ORDER — SILDENAFIL CITRATE 50 MG PO TABS: 50.0000 mg | ORAL_TABLET | ORAL | 1 refills | Status: DC | PRN

## 2016-05-14 MED ORDER — CYCLOBENZAPRINE HCL 5 MG PO TABS: 5.0000 mg | ORAL_TABLET | Freq: Every day | ORAL | 1 refills | Status: DC

## 2016-05-14 MED ORDER — INSULIN GLARGINE 100 UNIT/ML ~~LOC~~ SOLN: 30.0000 [IU] | Freq: Every day | SUBCUTANEOUS | 5 refills | Status: DC

## 2016-05-14 MED ORDER — LISINOPRIL 40 MG PO TABS: 40.0000 mg | ORAL_TABLET | Freq: Every day | ORAL | 3 refills | Status: DC

## 2016-05-14 MED FILL — CYCLOBENZAPRINE 5 MG TABLET: 5 | 90 days supply | Qty: 90 | Fill #0

## 2016-05-14 MED FILL — HYDROCODON-APAP 7.5-325: 7.5-325 | 11 days supply | Qty: 45 | Fill #0

## 2016-05-14 MED FILL — LANTUS 100 UNITS/ML VIAL: 100 | 28 days supply | Qty: 10 | Fill #0

## 2016-05-14 MED FILL — metFORMIN HCL 1000 MG TABS: 1000 | 45 days supply | Qty: 90 | Fill #2

## 2016-05-14 MED FILL — SILDENAFIL 50 MG TABLET: 50 | 30 days supply | Qty: 6 | Fill #0

## 2016-05-14 NOTE — Assessment & Plan Note (Addendum)
Continues to have chronic pain due to osteoarthritis and degenerative disc disease of the lumbar spine. He had a L5-S1 fusion many years ago. He has no red flag symptoms now. Currently his pain is not limiting his ability to work her other daily activities, no yellow flag symptoms. He continue to use Aleve daily, duloxetine 60 mg daily, and Flexeril once daily as needed for muscle spasm. We talked about his current prescription for hydrocodone. I want him to limit to 2-3 tabs per week, to only be used on days with the worst symptoms. I think it he uses it more frequently he'll develop tolerance and it will worsen other chronic problems like erectile dysfunction. Provided him with a refill for Norco #45, no refills, to be used as needed and this should last 3 months. No role for imaging right now, but if pain worsens to include yellow flag symptoms, an MRI would help to see if he is a candidate for steroid injections.

## 2016-05-14 NOTE — Assessment & Plan Note (Signed)
Likely vasculogenic given long-standing diabetes. We had to change from tadalafil to sildenafil today based on insurance formulary.

## 2016-05-14 NOTE — Assessment & Plan Note (Signed)
A1c is uncontrolled at 9.0% today. This is the highest that I have seen it. Likely related to his recent weight gain. He's also been decreasing his Lantus usage at home, he was under the assumption that he should be able to titrate off long-acting insulin. Goal A1c under 7%. Plan to go back to Lantus 30 units every day, and continue pioglitazone and metformin. We have tried GLP1 agonists, but limited by side effect. Continue aspirin and simvastatin for primary prevention. Lisinopril for renal protection. Needs a diabetic eye exam.

## 2016-05-14 NOTE — Patient Instructions (Signed)
1. Increase lisinopril to 40mg  once a day for your high blood pressure.   2. Check your blood pressure at home 2-3 times a week and write them down for me.   3. Use your lantus 30 units once a day, everyday.

## 2016-05-14 NOTE — Assessment & Plan Note (Signed)
Blood pressure is above goal today. On recheck it still elevated around 138/88. Plan is to increase lisinopril from 20 mg daily to 40 mg daily. He is going to purchase a pressure cuff to be used at home and will keep a blood pressure log. I go for his blood pressure based on comorbidities would be less than 130/80.

## 2016-05-14 NOTE — Progress Notes (Signed)
Assessment and Plan:  See Encounters tab for problem-based medical decision making.   __________________________________________________________  HPI:  46 year old man here for follow-up of diabetes. He reports things are going fairly well at home.  He has gained about 9 pounds over the last 2 months. He thinks it's because of dietary indiscretion during the holidays. He also reporting increasing pain in his lower back. Around the area where he previously had a L5-S1 spinal fusion surgery. The pain is an achy type pain that comes and goes but is felt most days. Often bothersome at night. It's worsened by activity at home. He's been going to physical therapy with some benefit, using the TENS unit there. He uses hydrocodone about 4 times a week. Uses Aleve most days. Good compliance with his other medications. Regarding his diabetes he has not been checking his blood glucose often at home. He's been trying to reduce the amount of Lantus that he is using at home. He says really taken injection of 50 units every 2-3 days. He is doing this because he wants to try to taper himself off the Lantus. Does not check his blood pressure at home. No chest pain or dyspnea on exertion. No recent fevers or chills. Trigger finger is doing well with no other locking symptoms.  __________________________________________________________  Problem List: Patient Active Problem List   Diagnosis Date Noted  . Diabetes ([place]) 01/06/2015    Priority: High  . Osteoarthritis of lumbar spine 04/09/2016    Priority: Medium  . Tendinopathy of left rotator cuff 12/30/2015    Priority: Medium  . Obstructive sleep apnea 02/28/2015    Priority: Medium  . Essential hypertension 01/06/2015    Priority: Medium  . Erectile dysfunction 01/06/2015    Priority: Low  . Trigger finger 01/06/2015    Priority: Low  . Healthcare maintenance 01/06/2015    Priority: Low    Medications: Reconciled today in  Epic __________________________________________________________  Physical Exam:  Vital Signs: Vitals:   05/14/16 0820 05/14/16 0904  BP: (!) 156/71 138/88  Pulse: 74   Temp: 98 F (36.7 C)   TempSrc: Oral   SpO2: 100%   Weight: (!) 313 lb 3.2 oz (142.1 kg)   Height: 6' (1.829 m)     Gen: Well appearing, NAD Neck: No cervical LAD, No thyromegaly or nodules, No JVD. CV: RRR, no murmurs Pulm: Normal effort, CTA throughout, no wheezing Abd: Soft, NT, ND. Ext: Warm, no edema, normal joints Skin: No atypical appearing moles. No rashes

## 2016-05-15 ENCOUNTER — Other Ambulatory Visit: Payer: Self-pay | Admitting: *Deleted

## 2016-05-15 MED ORDER — SIMVASTATIN 20 MG PO TABS: 20.0000 mg | ORAL_TABLET | Freq: Every day | ORAL | 11 refills | Status: DC

## 2016-05-15 MED FILL — SIMVASTATIN 20 MG TABLET: 20 | 90 days supply | Qty: 90 | Fill #0

## 2016-05-17 MED FILL — LANTUS 100 UNITS/ML VIAL: 100 | 28 days supply | Qty: 10 | Fill #1

## 2016-05-20 DIAGNOSIS — I10 Essential (primary) hypertension: Secondary | ICD-10-CM | POA: Diagnosis not present

## 2016-05-23 ENCOUNTER — Encounter: Payer: Self-pay | Admitting: *Deleted

## 2016-05-28 DIAGNOSIS — G894 Chronic pain syndrome: Secondary | ICD-10-CM | POA: Diagnosis not present

## 2016-05-28 DIAGNOSIS — G8911 Acute pain due to trauma: Secondary | ICD-10-CM | POA: Diagnosis not present

## 2016-05-28 DIAGNOSIS — M545 Low back pain: Secondary | ICD-10-CM | POA: Diagnosis not present

## 2016-05-28 DIAGNOSIS — M542 Cervicalgia: Secondary | ICD-10-CM | POA: Diagnosis not present

## 2016-06-08 ENCOUNTER — Other Ambulatory Visit: Payer: Self-pay

## 2016-06-08 DIAGNOSIS — Z794 Long term (current) use of insulin: Principal | ICD-10-CM

## 2016-06-08 DIAGNOSIS — E119 Type 2 diabetes mellitus without complications: Secondary | ICD-10-CM

## 2016-06-08 MED FILL — LANTUS 100 UNITS/ML VIAL: 100 | 30 days supply | Qty: 10 | Fill #5

## 2016-06-11 MED ORDER — ALBUTEROL SULFATE HFA 108 (90 BASE) MCG/ACT IN AERS: 2.0000 | INHALATION_SPRAY | Freq: Four times a day (QID) | RESPIRATORY_TRACT | 1 refills | Status: DC | PRN

## 2016-06-11 MED ORDER — PIOGLITAZONE HCL 45 MG PO TABS: 45.0000 mg | ORAL_TABLET | Freq: Every day | ORAL | 3 refills | Status: DC

## 2016-06-11 MED FILL — LISINOPRIL 40 MG TABLET: 40 | 90 days supply | Qty: 90 | Fill #0

## 2016-06-11 MED FILL — PIOGLITAZONE HCL 45 MG TAB: 45 | 90 days supply | Qty: 90 | Fill #0

## 2016-06-11 MED FILL — VENTOLIN HFA 90 MCG INHALER: 108 (90 BAS | 25 days supply | Qty: 18 | Fill #0

## 2016-06-27 DIAGNOSIS — M545 Low back pain: Secondary | ICD-10-CM | POA: Diagnosis not present

## 2016-06-27 DIAGNOSIS — M542 Cervicalgia: Secondary | ICD-10-CM | POA: Diagnosis not present

## 2016-06-27 DIAGNOSIS — G894 Chronic pain syndrome: Secondary | ICD-10-CM | POA: Diagnosis not present

## 2016-07-02 MED FILL — LANTUS 100 UNITS/ML VIAL: 100 | 28 days supply | Qty: 10 | Fill #2

## 2016-07-02 MED FILL — metFORMIN HCL 1000 MG TABS: 1000 | 45 days supply | Qty: 90 | Fill #3

## 2016-07-16 MED FILL — DULoxetine HCL 60 MG CPEP: 60 | 90 days supply | Qty: 90 | Fill #1

## 2016-07-17 ENCOUNTER — Encounter: Payer: Self-pay | Admitting: Dietician

## 2016-07-17 ENCOUNTER — Telehealth: Payer: Self-pay | Admitting: Dietician

## 2016-07-17 DIAGNOSIS — E119 Type 2 diabetes mellitus without complications: Secondary | ICD-10-CM

## 2016-07-17 DIAGNOSIS — Z794 Long term (current) use of insulin: Principal | ICD-10-CM

## 2016-07-17 NOTE — Telephone Encounter (Signed)
Patient called for an appointment for next Friday afternoon for help with diet and diabetes. Request referral for same.

## 2016-07-17 NOTE — Addendum Note (Signed)
Addended by: Lalla Brothers T on: 07/17/2016 04:54 PM   Modules accepted: Orders

## 2016-07-25 DIAGNOSIS — M545 Low back pain: Secondary | ICD-10-CM | POA: Diagnosis not present

## 2016-07-25 DIAGNOSIS — M542 Cervicalgia: Secondary | ICD-10-CM | POA: Diagnosis not present

## 2016-07-25 DIAGNOSIS — G894 Chronic pain syndrome: Secondary | ICD-10-CM | POA: Diagnosis not present

## 2016-07-27 ENCOUNTER — Ambulatory Visit (INDEPENDENT_AMBULATORY_CARE_PROVIDER_SITE_OTHER): Payer: 59 | Admitting: Dietician

## 2016-07-27 DIAGNOSIS — Z713 Dietary counseling and surveillance: Secondary | ICD-10-CM | POA: Diagnosis not present

## 2016-07-27 DIAGNOSIS — Z794 Long term (current) use of insulin: Secondary | ICD-10-CM | POA: Diagnosis not present

## 2016-07-27 DIAGNOSIS — Z6841 Body Mass Index (BMI) 40.0 and over, adult: Secondary | ICD-10-CM

## 2016-07-27 DIAGNOSIS — E119 Type 2 diabetes mellitus without complications: Secondary | ICD-10-CM

## 2016-07-27 NOTE — Patient Instructions (Addendum)
You need 2,827 Calories/day to maintain your weight.   You need 2,327 Calories/day to lose 1 lb per week.   You need 1,827 Calories/day to lose 2 lb per week.  90-120 gram protein/day   Breakfast- 2 whole grain starches                    2 oz protein                    Fruit   Lunch- 2 servings of whole grain    3-4 oz protein              fruit              1-2 cup Vegetable   Snack- fruit, peanut butter  ~ 100 -150 calories        Dinner- 2 servings of whole grain    3-4 oz protein               Fruit as needed              1-2 cup Vegetable   Snacks- yogurt or fruit/grain with protein- ~ 100-200 calories   The above is about 63 grams protien from protein alone. The vegetable, grains and diary add more protein.  Fruit- eat at least 2 servings a day- not juice. For now- may want to limit to no more than 4 servings a day. (1 servings is a small apple, orange, 1/2 banana. 15-17 grapes, 1 medium sized pear, for others look at list in the book)  For weight loss- it is important to  1- Track your progress- food intake, steps, weight 2- Eat low fat, high fiber, whole "food" well balanced meals and snacks 3- Be active for at least 60 minutes EVERY DAY-skipping 1-2 days at most sometimes  Please check your morning sugar daily so we can see if your medicine needs to be adjusted. Also, please bring your meter next week and to all visits in case we need to look at your readings.   Butch Penny  [phone]

## 2016-07-27 NOTE — Progress Notes (Signed)
  Medical Nutrition Therapy:  Appt start time: 1300 end time:  1400. Visit # 2 Last visit 2016  Assessment:  Primary concerns today: weight loss. Mr. Zywicki wants help with weight loss. He has changed jobs and is less active now and his weight has been increasing. His is uncomfortable at his current weight. He feels that his alcohol intake in the evening of 5-6 vodka drinks with sweet mixers is the cause of her inability to take weight off and weight gain and is willing to stop. His last drink was 3 nights ago. He has asked his wife for support and feels his daughter is also supportive. He reports his weight has continued to climb sicne he developed diabetes. He is buying a treadmill to help him increase his activity and we set up his phone to help him track and review his steps.  Preferred Learning Style:No preference indicated  Learning Readiness: Ready  ANTHROPOMETRICS: weight- 318# reported, height- 6', BMI>42 WEIGHT HISTORY: Highest: 318# Lowest-  SLEEP:"well"- 930 Pm to 530 AM, gets up 1-2 times to [facility] (times/week): 1-2 times a month MEDICATIONS: 1000 mg metformin two time a day, 45 mg actos, 50 units lantus at bedtime, victoza caused increase heartrate BLOOD SUGAR:no meter today- reports fasting in the 120 range, later in the day ~ 145-160, a1c 6.8% ~ 150 mg/dl on average DIETARY INTAKE: Usual eating pattern includes 1-2 meals and 1-2 snacks per day.Likes most foods  Avoided foods include soft tofu.   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   Nutritional  Diagnosis:  NI-1.7 Predicted excessive energy intake As related to excess alcohol consumption.  As evidenced by his report and weight of 318#.Marland Kitchen    Intervention:  Nutrition education and counseling about weight loss, support, meal planning, tracking, activity needed to reduce muscle loss. Coordination of care: For success with weight loss suggest addressing diabetes and other medicines that cause weight gain: consider weight loss medicine if he is in agreement. May want to consider retrying victoza- (wonder if he had a low blood sugar), suggest alternative to pioglitazone and providing him with guidelines to reduce lantus according  to his fasting blood sugar.   Teaching Method Utilized: Visual. Auditory,Hands on Handouts given during visit include:AVS, Diabetes meal planning book, activity and food tracker, snack suggestions Barriers to learning/adherence to lifestyle change: competing values Demonstrated degree of understanding via:  Teach Back   Monitoring/Evaluation:  Dietary intake, exercise, meter, and body weight in 1 week(s).

## 2016-07-30 MED FILL — SILDENAFIL 50 MG TABLET: 50 | 30 days supply | Qty: 6 | Fill #1

## 2016-07-30 MED FILL — LANTUS 100 UNITS/ML VIAL: 100 | 28 days supply | Qty: 10 | Fill #3

## 2016-08-03 ENCOUNTER — Ambulatory Visit: Payer: 59 | Admitting: Dietician

## 2016-08-10 MED FILL — SIMVASTATIN 20 MG TABLET: 20 | 90 days supply | Qty: 90 | Fill #1

## 2016-08-10 MED FILL — metFORMIN HCL 1000 MG TABS: 1000 | 45 days supply | Qty: 90 | Fill #4

## 2016-08-21 ENCOUNTER — Ambulatory Visit: Payer: 59 | Admitting: Dietician

## 2016-08-24 DIAGNOSIS — G894 Chronic pain syndrome: Secondary | ICD-10-CM | POA: Diagnosis not present

## 2016-08-24 DIAGNOSIS — M542 Cervicalgia: Secondary | ICD-10-CM | POA: Diagnosis not present

## 2016-08-24 DIAGNOSIS — M545 Low back pain: Secondary | ICD-10-CM | POA: Diagnosis not present

## 2016-09-03 MED FILL — LANTUS 100 UNITS/ML VIAL: 100 | 28 days supply | Qty: 10 | Fill #4

## 2016-09-03 MED FILL — LISINOPRIL 40 MG TAB: 40 | 90 days supply | Qty: 90 | Fill #1

## 2016-09-03 MED FILL — PIOGLITAZONE HCL 45 MG TAB: 45 | 90 days supply | Qty: 90 | Fill #1

## 2016-09-07 MED FILL — SILDENAFIL 50 MG TABLET: 50 | 30 days supply | Qty: 6 | Fill #2

## 2016-09-25 DIAGNOSIS — M545 Low back pain: Secondary | ICD-10-CM | POA: Diagnosis not present

## 2016-09-25 DIAGNOSIS — G894 Chronic pain syndrome: Secondary | ICD-10-CM | POA: Diagnosis not present

## 2016-09-25 DIAGNOSIS — M542 Cervicalgia: Secondary | ICD-10-CM | POA: Diagnosis not present

## 2016-09-25 MED FILL — LANTUS 100 UNITS/ML VIAL: 100 | 28 days supply | Qty: 10 | Fill #5

## 2016-09-25 MED FILL — metFORMIN HCL 1000 MG TABS: 1000 | 45 days supply | Qty: 90 | Fill #5

## 2016-10-08 MED FILL — SILDENAFIL 50 MG TABLET: 50 | 30 days supply | Qty: 6 | Fill #3

## 2016-10-08 MED FILL — DULoxetine HCL 60 MG CPEP: 60 | 90 days supply | Qty: 90 | Fill #2

## 2016-10-23 ENCOUNTER — Other Ambulatory Visit: Payer: Self-pay | Admitting: Student in an Organized Health Care Education/Training Program

## 2016-10-23 DIAGNOSIS — E119 Type 2 diabetes mellitus without complications: Secondary | ICD-10-CM

## 2016-10-23 MED FILL — LANTUS 100 UNITS/ML VIAL: 100 | 28 days supply | Qty: 10 | Fill #0

## 2016-10-23 NOTE — Telephone Encounter (Signed)
insulin glargine (LANTUS) 100 UNIT/ML injection, refill request @ outpatient pharmacy. Requesting 90 days supply.

## 2016-10-29 DIAGNOSIS — M542 Cervicalgia: Secondary | ICD-10-CM | POA: Diagnosis not present

## 2016-10-29 DIAGNOSIS — M545 Low back pain: Secondary | ICD-10-CM | POA: Diagnosis not present

## 2016-10-29 DIAGNOSIS — G894 Chronic pain syndrome: Secondary | ICD-10-CM | POA: Diagnosis not present

## 2016-10-30 ENCOUNTER — Ambulatory Visit (INDEPENDENT_AMBULATORY_CARE_PROVIDER_SITE_OTHER): Payer: 59 | Admitting: Internal Medicine

## 2016-10-30 ENCOUNTER — Encounter: Payer: Self-pay | Admitting: Internal Medicine

## 2016-10-30 VITALS — BP 173/82 | HR 96 | Temp 98.2°F | Wt 318.4 lb

## 2016-10-30 DIAGNOSIS — Z794 Long term (current) use of insulin: Secondary | ICD-10-CM | POA: Diagnosis not present

## 2016-10-30 DIAGNOSIS — Z79899 Other long term (current) drug therapy: Secondary | ICD-10-CM | POA: Diagnosis not present

## 2016-10-30 DIAGNOSIS — M47816 Spondylosis without myelopathy or radiculopathy, lumbar region: Secondary | ICD-10-CM | POA: Diagnosis not present

## 2016-10-30 DIAGNOSIS — Z981 Arthrodesis status: Secondary | ICD-10-CM | POA: Diagnosis not present

## 2016-10-30 DIAGNOSIS — Z87891 Personal history of nicotine dependence: Secondary | ICD-10-CM

## 2016-10-30 DIAGNOSIS — I10 Essential (primary) hypertension: Secondary | ICD-10-CM | POA: Diagnosis not present

## 2016-10-30 DIAGNOSIS — E785 Hyperlipidemia, unspecified: Secondary | ICD-10-CM | POA: Diagnosis not present

## 2016-10-30 DIAGNOSIS — E119 Type 2 diabetes mellitus without complications: Secondary | ICD-10-CM | POA: Diagnosis not present

## 2016-10-30 LAB — POCT GLYCOSYLATED HEMOGLOBIN (HGB A1C): Hemoglobin A1C: 7

## 2016-10-30 LAB — GLUCOSE, CAPILLARY: Glucose-Capillary: 286 mg/dL — ABNORMAL HIGH (ref 65–99)

## 2016-10-30 MED ORDER — HYDROCHLOROTHIAZIDE 25 MG PO TABS: 25.0000 mg | ORAL_TABLET | Freq: Every day | ORAL | 3 refills | Status: DC

## 2016-10-30 MED ORDER — CYCLOBENZAPRINE HCL 10 MG PO TABS: 10.0000 mg | ORAL_TABLET | Freq: Three times a day (TID) | ORAL | 1 refills | Status: DC | PRN

## 2016-10-30 MED FILL — CYCLOBENZAPRINE 10 MG TAB: 10 | 10 days supply | Qty: 30 | Fill #0

## 2016-10-30 MED FILL — HYDROCHLOROTHIAZIDE 25 MG T: 25 | 90 days supply | Qty: 90 | Fill #0

## 2016-10-30 NOTE — Assessment & Plan Note (Signed)
The patient's A1C was at goal of 7.0% today. The patient also brought in his glucose monitor and these logs were scanned into the patient's chart for PCP review. His average blood glucose was >200 and he did not have any episodes of hypoglycemia. patient reports taking 50 units of Lantus nightly. He also takes metformin and pioglitazone daily. He reports no difficulties taking this medication and denies any symptoms of hypoglycemia. He was instructed to continue to take his medications as previously prescribed.   The patient mentioned that he is interested in changing around his diabetes medications because he is afraid that the medications prevent him from loosing weight. He states that he walks about 2 miles a day and expresses concern that he is unable to loose weight with exercise. He has already talked to the clinic's dietician about diet control and would like to talk to his PCP about other ways he can loose weight. I encouraged his weight loss efforts and told him to keep with his lifestyle modifications, as weight loss will help with both DM and HTN. Attention to this on follow up would be appreciated by the patient.

## 2016-10-30 NOTE — Patient Instructions (Addendum)
Thank you for seeing [place] today!  You are having an acute flare of your chronic lower back pain. Please start taking Flexeril, 10 mg. You should start taking 1 of these at bedtime. We recommend taking it at bedtime because it can make you drowsy. You can take up to 3 of these per day for your back pain if you need it. Discontinue use of this medications after two weeks. Continue your other medications for your chronic back pain during this time.   Your blood pressure was high at the clinic today. Please start taking hydrochlorothiazide, 25 mg. Take 1 tablet every morning. Please continue taking your lisinopril 40 mg. We did blood work today to examine your kidney function. You will be called with the results. You will follow up with Dr. Evette Doffing in continuity clinic for your BP recheck and management.   Your A1C was 7.0% today. Please continue to take your diabetes medications as previously prescribed.   Follow up with Dr. Evette Doffing later this month for management of your chronic medical conditions.

## 2016-10-30 NOTE — Assessment & Plan Note (Signed)
The patient states that his current left sided back pain is similar to what he usually feels with his chronic lower back pain, but just in a different location. He does not have any red flag symptoms, including as daily fevers, night sweats, loss of bowel or bladder function, loss of motor function/sensation or radiation of pain to legs, that would necessitate imaging. The description of his pain is more consistent with muscle spasms, which the patient has had in the past. He was instructed to start taking Flexeril, 10 mg tablets each night. He was told he can take these up to three times a day as needed. He was instructed to discontinue use of this medication after about 2 weeks, as it can loose its effectiveness if taken chronically. The patient was told to return to clinic or go to the ED if concerning symptoms appear. Resolution of this acute exacerbation of chronic lower back pain can be reassessed at follow up.

## 2016-10-30 NOTE — Assessment & Plan Note (Signed)
The patient's SBP was elevated >170. The patient also exhibit trace pitting edema on exam bilaterally. The patient denies symptoms of SOB with exertion and chest pain. Given the patient's diagnosis of diabetes, his SBP should be more tightly controlled. The patient will start HCTZ 25 mg daily. He will continue his previous regimen of lisinopril 40 mg daily. We will check a BMP today to assess kidney function and K+ levels, since this has not been checked in 1 year. He will follow up with his PCP in about 2 weeks for BP check and reassessment.

## 2016-10-30 NOTE — Progress Notes (Signed)
   CC: Acute onset of lower back pain and osteoarthritis of lumbar spine follow up   HPI:  [name] is a 46 y.o. with a PMH of DM, HLD, HTN and chronic lower back pain who presents today for evaluation of left sided lower back. The pain is described as a dull, constant ache which occurs on his left flank. It started two weeks ago and has been there every day since he first noticed it. The pain is rated on a scale of 5-6 out of 10. The pain does not prevent him from doing his daily work or completing activities of daily living. He thinks it is similar to the pain he has felt chronically but wanted to get it checked out because it was located in a different spot than usual. He has not experienced any acute trauma. He denies radiation of this pain to his legs, loss of motor function, loss of bowel function, changes in sensation, difficulties with urination, pain with urination, change in urine color and odor.  Per chart review the patient has a history of back pain 2/2 osteoarthritis and degenerative disc disease in the lumbar spine. He reports a L5-S1 fusion many years ago. His pain has been well managed on Aleve and duloxetine 60 mg. He was last prescribed hydrocodone - acetaminophen, 7.5-325 mg for his pain to use PRN. Patient reports taking one a day and states that this helps with his chronic back pain.   The patient states that he is also worried about his weight. He has been unable to loose weight with all of his medications. He walks about 2 miles a day and has tried to implement dietary changes to help him loose weight. He states he is frustrated that these changes aren't working and wants to know if his medication regimen has anything to do with his weight gain.   The patient is set to visit Dr. Evette Doffing later this month and brought in his glucometer today so that Dr. Evette Doffing could have his records for that visit.   Past Medical History:  Diagnosis Date  . Allergy   . Depression   .  Diabetes mellitus without complication ([place])   . Hyperlipidemia   . Hypertension   . Sleep apnea    Review of Systems:   Patient denies headaches, changes in vision, SOB, abdominal pain, and blood in urine and stool.   Physical Exam:  Vitals:   10/30/16 1439  BP: (!) 173/82  Pulse: 96  Temp: 98.2 F (36.8 C)  TempSrc: Oral  SpO2: 96%  Weight: (!) 318 lb 6.4 oz (144.4 kg)   Physical Exam  Cardiovascular: Normal rate, regular rhythm, normal heart sounds and intact distal pulses.  Exam reveals no friction rub.   No murmur heard. Trace pitting edema to mid shins bilaterally  Pulmonary/Chest: Effort normal and breath sounds normal. No respiratory distress. He has no wheezes.  Abdominal: Soft. Bowel sounds are normal. He exhibits no distension.  Musculoskeletal:  Light tenderness to palpation of left mid thoracic spine. No deformities or swelling. CVA tenderness absent bilaterally. Back extension at hips limited 2/2 pain during exam. Back flexion at hips within normal limits.    Neurological:  Gross motor function and sensation of lower extremities intact bilaterally.  Skin: Skin is warm and dry. Capillary refill takes less than 2 seconds. No erythema.     Assessment & Plan:   See Encounters Tab for problem based charting.  Patient seen with Dr. Angelia Mould.

## 2016-10-31 LAB — BMP8+ANION GAP
ANION GAP: 15 mmol/L (ref 10.0–18.0)
BUN/Creatinine Ratio: 18 (ref 9–20)
BUN: 14 mg/dL (ref 6–24)
CALCIUM: 9.4 mg/dL (ref 8.7–10.2)
CO2: 27 mmol/L (ref 20–29)
CREATININE: 0.77 mg/dL (ref 0.76–1.27)
Chloride: 97 mmol/L (ref 96–106)
GFR calc Af Amer: 125 mL/min/{1.73_m2} (ref >59)
GFR calc non Af Amer: 108 mL/min/{1.73_m2} (ref >59)
Glucose: 227 mg/dL — ABNORMAL HIGH (ref 65–99)
Potassium: 4.8 mmol/L (ref 3.5–5.2)
SODIUM: 139 mmol/L (ref 134–144)

## 2016-11-02 NOTE — Progress Notes (Signed)
Internal Medicine Clinic Attending  I saw and evaluated the patient.  I personally confirmed the key portions of the history and exam documented by Dr. Nedrud and I reviewed pertinent patient test results.  The assessment, diagnosis, and plan were formulated together and I agree with the documentation in the resident's note.  

## 2016-11-08 MED FILL — metFORMIN HCL 1000 MG TABS: 1000 | 90 days supply | Qty: 180 | Fill #6

## 2016-11-08 MED FILL — CYCLOBENZAPRINE 10 MG TAB: 10 | 10 days supply | Qty: 30 | Fill #1

## 2016-11-08 MED FILL — SILDENAFIL 50 MG TABLET: 50 | 30 days supply | Qty: 6 | Fill #4

## 2016-11-08 MED FILL — SIMVASTATIN 20 MG TABLET: 20 | 90 days supply | Qty: 90 | Fill #2

## 2016-11-12 ENCOUNTER — Ambulatory Visit (INDEPENDENT_AMBULATORY_CARE_PROVIDER_SITE_OTHER): Payer: 59 | Admitting: Student in an Organized Health Care Education/Training Program

## 2016-11-12 ENCOUNTER — Encounter: Payer: Self-pay | Admitting: Student in an Organized Health Care Education/Training Program

## 2016-11-12 VITALS — BP 132/77 | HR 93 | Temp 97.8°F | Ht 72.0 in | Wt 321.2 lb

## 2016-11-12 DIAGNOSIS — Z79899 Other long term (current) drug therapy: Secondary | ICD-10-CM | POA: Diagnosis not present

## 2016-11-12 DIAGNOSIS — G4733 Obstructive sleep apnea (adult) (pediatric): Secondary | ICD-10-CM | POA: Diagnosis not present

## 2016-11-12 DIAGNOSIS — Z79891 Long term (current) use of opiate analgesic: Secondary | ICD-10-CM

## 2016-11-12 DIAGNOSIS — I10 Essential (primary) hypertension: Secondary | ICD-10-CM | POA: Diagnosis not present

## 2016-11-12 DIAGNOSIS — Z6841 Body Mass Index (BMI) 40.0 and over, adult: Secondary | ICD-10-CM | POA: Diagnosis not present

## 2016-11-12 DIAGNOSIS — M47816 Spondylosis without myelopathy or radiculopathy, lumbar region: Secondary | ICD-10-CM

## 2016-11-12 DIAGNOSIS — Z794 Long term (current) use of insulin: Secondary | ICD-10-CM

## 2016-11-12 DIAGNOSIS — H9193 Unspecified hearing loss, bilateral: Secondary | ICD-10-CM | POA: Diagnosis not present

## 2016-11-12 DIAGNOSIS — E1121 Type 2 diabetes mellitus with diabetic nephropathy: Secondary | ICD-10-CM

## 2016-11-12 DIAGNOSIS — Z8349 Family history of other endocrine, nutritional and metabolic diseases: Secondary | ICD-10-CM

## 2016-11-12 DIAGNOSIS — R5383 Other fatigue: Secondary | ICD-10-CM | POA: Insufficient documentation

## 2016-11-12 DIAGNOSIS — E119 Type 2 diabetes mellitus without complications: Secondary | ICD-10-CM

## 2016-11-12 DIAGNOSIS — Z833 Family history of diabetes mellitus: Secondary | ICD-10-CM

## 2016-11-12 DIAGNOSIS — H9190 Unspecified hearing loss, unspecified ear: Secondary | ICD-10-CM | POA: Insufficient documentation

## 2016-11-12 MED ORDER — INSULIN GLARGINE 100 UNIT/ML ~~LOC~~ SOLN: 40.0000 [IU] | Freq: Every day | SUBCUTANEOUS | 5 refills | Status: DC

## 2016-11-12 MED ORDER — CANAGLIFLOZIN 100 MG PO TABS: 100.0000 mg | ORAL_TABLET | Freq: Every day | ORAL | 1 refills | Status: DC

## 2016-11-12 MED FILL — INVOKANA 100 MG TABLET: 100 | 90 days supply | Qty: 90 | Fill #0

## 2016-11-12 NOTE — Assessment & Plan Note (Signed)
Progressive bilateral hearing loss over months to years. 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 Does not use Q-tips. No other recent traumas. On exam his ear canals look normal with no wax impaction. Plan is to refer to ENT for hearing testing, patient is amenable to hearing aids if he qualifies.

## 2016-11-12 NOTE — Assessment & Plan Note (Signed)
Weight today 321 pounds, height is 6 foot, BMI 43.7. He's gained 8 pounds in the last 6 months unintentionally. He reports good nutrition, high quality calories, no junk food, limited carbohydrates. Reports walking 3 times weekly. We set a goal to try to increase his activity to walking every day. Set a goal to try to limit portion size. He's failed GLP-1 agonists in the past because of side effects. We started an SGLT2 inhibitor today which has modest weight loss associated with it. We talked about weight reduction surgery, he would qualify for it based on his BMI. I think weight loss is essential for him, for keeping his diabetes under control and for controlling his osteoarthritis long-term. He'll think about it, this might be something that we pursue in the future.

## 2016-11-12 NOTE — Assessment & Plan Note (Signed)
Stable symptoms, doing well on nightly CPAP.

## 2016-11-12 NOTE — Patient Instructions (Signed)
1. Stop taking Actos. We will switch to Invokana, 100mg  daily. Come back in three months for a repeat A1c. We can talk about strategies to reduce your Lantus at our next visit.   2. Keep up the good work with walking daily and eating healthy food.   3. Let me know if your back pain changes or worsens. If there are new symptoms down your legs or the pain prevents you from working, we can think about an MRI and potentially injections or surgery in the future.

## 2016-11-12 NOTE — Progress Notes (Signed)
   Assessment and Plan:  See Encounters tab for problem-based medical decision making.   __________________________________________________________  HPI:   46 year old man here for follow-up of chronic lower back pain and diabetes. Since I last saw the patient he has established with a local pain clinic and is now using hydrocodone 7.5 mg once daily for his chronic lower back pain. He has a history of a L4-L5 anterior discectomy with fusion about 10 years ago. He was on chronic narcotics for many years while in [place]. When he moved down to [place] we discontinued his narcotics and he did fairly well for several months. Over the last year he has used narcotics intermittently for bad days of low back pain. Currently he does not have any radicular symptoms. Denies leg pain with prolonged standing or ambulation. Says that the pain is just located in the lumbar back on the left side. Also has mild left lateral hip pain. He has a sedentary job, office work, works about 7 AM to 5 PM. Tries to walk about 3 times weekly. Uses a fit bit. Reports good compliance with CPAP. Does endorse significant fatigue, he is wondering what his testosterone levels are. No chest pain, no dyspnea on exertion.  __________________________________________________________  Problem List: Patient Active Problem List   Diagnosis Date Noted  . Obesity, Class III, BMI 40-49.9 (morbid obesity) ([place]) 11/12/2016    Priority: High  . Diabetes ([place]) 01/06/2015    Priority: High  . Hearing loss 11/12/2016    Priority: Medium  . Osteoarthritis of lumbar spine 04/09/2016    Priority: Medium  . Tendinopathy of left rotator cuff 12/30/2015    Priority: Medium  . Obstructive sleep apnea 02/28/2015    Priority: Medium  . Essential hypertension 01/06/2015    Priority: Medium  . Erectile dysfunction 01/06/2015    Priority: Low  . Healthcare maintenance 01/06/2015    Priority: Low  . Fatigue 11/12/2016    Medications:  Reconciled today in Epic __________________________________________________________  Physical Exam:  Vital Signs: Vitals:   11/12/16 0816  BP: 132/77  Pulse: 93  Temp: 97.8 F (36.6 C)  TempSrc: Oral  SpO2: 96%  Weight: (!) 321 lb 3.2 oz (145.7 kg)  Height: 6' (1.829 m)    Gen: Well appearing, NAD Neck: No cervical LAD, No thyromegaly or nodules, No JVD. CV: RRR, no murmurs Pulm: Normal effort, CTA throughout, no wheezing Ext: Warm, no edema, normal joints Skin: No atypical appearing moles. No rashes Neuro: Full strength in lower extremities, normal patellar reflexes.

## 2016-11-12 NOTE — Assessment & Plan Note (Signed)
Patient with chronic lower back pain for many years related to degenerative arthritis of the lumbar spine. He had an anterior L4-L5 discectomy with fusion completed about 10 years ago when he was living in [place]. Since I last saw him [facility] and is now on 1 tablet daily of hydrocodone for his chronic pain in addition to the Cymbalta and Flexeril. He reports he is still able to work, pain is not limiting his function. Denies radiculopathy, denies symptoms of neurogenic claudication. He isn't interested in a repeat MRI to see if his arthritis was progressing, I recommended against at this time given that his functional status is stable. Patient also does not want to undergo invasive surgeries again, and without radicular symptoms or spinal stenosis symptoms I doubt surgery or injections will be much benefit for him. I think the real key to controlling his back pain is can be weight loss.

## 2016-11-12 NOTE — Assessment & Plan Note (Signed)
Hemoglobin A1c recently decreased to 7.0% which is been a nice change. Patient increased his Lantus from 30 units to 40 units on his own until last saw him. Otherwise he is compliant with metformin and Actos. We talked about different agents that we could use to try to help with weight loss. Both Lantus and Actos are going to cause weight gain. We decided to discontinue Actos, and start Invokana 100 mg daily. In the past we have tried Victoza however he was unable to tolerate this because of palpitations. Plan will be to follow-up 3 months and recheck his A1c. Hopefully we'll be able to down titrate the Lantus as well to try to promote weight loss.

## 2016-11-12 NOTE — Assessment & Plan Note (Signed)
Patient with increasing fatigue over the last several months. Likely multifactorial with recently starting daily hydrocodone for his low back pain, also has sleep apnea on CPAP, uses Flexeril several times a week, and has some component of depression. In the past he had a diagnosis of low testosterone and was on testosterone supplements. I recommended against rechecking testosterone today, I think it's likely to be low, especially given daily narcotics and obesity. I think the risks of the medication for him probably outweigh the small benefits. Plan is to check a CBC to rule out anemia.

## 2016-11-12 NOTE — Assessment & Plan Note (Signed)
Blood pressure well controlled today. Plan to continue with lisinopril 40 mg and HCTZ 25 mg once daily.

## 2016-11-13 LAB — CBC
HEMOGLOBIN: 15.6 g/dL (ref 13.0–17.7)
Hematocrit: 45 % (ref 37.5–51.0)
MCH: 32.2 pg (ref 26.6–33.0)
MCHC: 34.7 g/dL (ref 31.5–35.7)
MCV: 93 fL (ref 79–97)
Platelets: 214 10*3/uL (ref 150–379)
RBC: 4.85 x10E6/uL (ref 4.14–5.80)
RDW: 13.3 % (ref 12.3–15.4)
WBC: 7.5 10*3/uL (ref 3.4–10.8)

## 2016-11-13 LAB — LIPID PANEL
CHOL/HDL RATIO: 3.6 ratio (ref 0.0–5.0)
Cholesterol, Total: 190 mg/dL (ref 100–199)
HDL: 53 mg/dL (ref >39)
LDL CALC: 87 mg/dL (ref 0–99)
TRIGLYCERIDES: 252 mg/dL — AB (ref 0–149)
VLDL Cholesterol Cal: 50 mg/dL — ABNORMAL HIGH (ref 5–40)

## 2016-11-14 ENCOUNTER — Encounter: Payer: Self-pay | Admitting: Student in an Organized Health Care Education/Training Program

## 2016-11-16 MED FILL — LANTUS 100 UNITS/ML VIAL: 100 | 84 days supply | Qty: 30 | Fill #1

## 2016-11-23 DIAGNOSIS — G894 Chronic pain syndrome: Secondary | ICD-10-CM | POA: Diagnosis not present

## 2016-11-23 DIAGNOSIS — Z79891 Long term (current) use of opiate analgesic: Secondary | ICD-10-CM | POA: Diagnosis not present

## 2016-11-23 DIAGNOSIS — M542 Cervicalgia: Secondary | ICD-10-CM | POA: Diagnosis not present

## 2016-11-23 DIAGNOSIS — M545 Low back pain: Secondary | ICD-10-CM | POA: Diagnosis not present

## 2016-11-27 ENCOUNTER — Ambulatory Visit: Payer: 59

## 2016-11-27 ENCOUNTER — Encounter: Payer: Self-pay | Admitting: Student in an Organized Health Care Education/Training Program

## 2016-11-30 ENCOUNTER — Telehealth: Payer: Self-pay | Admitting: *Deleted

## 2016-11-30 NOTE — Telephone Encounter (Signed)
Pt calls and states since changing to invokana his blood sugars are higher than normal, not daily and when ask how often he just responds not daily, he states when they are up it is after meals and are 350 to 400. When ask about fasting he states they are pretty good at around 120. He is ask if he has been taking his meds as prescribed and states yes but then he states he does use some regular insulin that he has when they have been up, he is ask how much and he states 2u for every 50 over 150, the most he has used he states is 12 to 14 units. He states he has done so several times. Reviewed your schedule no openings til September, you will be here 8/15 attending in pm so just in case made him an appt for that pm, if he doesn't need to come in I will cancel it. Please advise

## 2016-11-30 NOTE — Telephone Encounter (Signed)
Thank you. I spoke with Adam [name]. It sounds like he is having some asymptomatic intermittent hyperglycemia, but no dietary changes. Plan is to continue with invokana 100mg  daily, metformin, and lantus 40 units daily. I will see him at our next usual visit and we can check his A1c then.

## 2016-12-03 ENCOUNTER — Other Ambulatory Visit: Payer: Self-pay | Admitting: Student in an Organized Health Care Education/Training Program

## 2016-12-03 MED ORDER — "INSULIN SYRINGE-NEEDLE U-100 30G X 5/16"" 0.5 ML MISC": 1 refills | Status: DC

## 2016-12-03 MED FILL — LISINOPRIL 40 MG TAB: 40 | 90 days supply | Qty: 90 | Fill #2

## 2016-12-03 MED FILL — ULTICARE SYR 0.5 ML 30GX5/1: 30G X 5/16" | 90 days supply | Qty: 400 | Fill #0

## 2016-12-03 NOTE — Telephone Encounter (Signed)
Pt requesting a 90 day supply of syringes.

## 2016-12-05 ENCOUNTER — Ambulatory Visit (INDEPENDENT_AMBULATORY_CARE_PROVIDER_SITE_OTHER): Payer: 59 | Admitting: Internal Medicine

## 2016-12-05 ENCOUNTER — Encounter: Payer: Self-pay | Admitting: Internal Medicine

## 2016-12-05 VITALS — BP 151/89 | HR 86 | Temp 98.5°F | Ht 72.0 in | Wt 311.6 lb

## 2016-12-05 DIAGNOSIS — Z794 Long term (current) use of insulin: Principal | ICD-10-CM

## 2016-12-05 DIAGNOSIS — Z79899 Other long term (current) drug therapy: Secondary | ICD-10-CM | POA: Diagnosis not present

## 2016-12-05 DIAGNOSIS — E119 Type 2 diabetes mellitus without complications: Secondary | ICD-10-CM

## 2016-12-05 DIAGNOSIS — I10 Essential (primary) hypertension: Secondary | ICD-10-CM

## 2016-12-05 DIAGNOSIS — Z7984 Long term (current) use of oral hypoglycemic drugs: Secondary | ICD-10-CM | POA: Diagnosis not present

## 2016-12-05 DIAGNOSIS — Z87891 Personal history of nicotine dependence: Secondary | ICD-10-CM | POA: Diagnosis not present

## 2016-12-05 LAB — GLUCOSE, CAPILLARY: GLUCOSE-CAPILLARY: 218 mg/dL — AB (ref 65–99)

## 2016-12-05 MED ORDER — INSULIN GLARGINE 100 UNIT/ML ~~LOC~~ SOLN: 45.0000 [IU] | Freq: Every day | SUBCUTANEOUS | 5 refills | Status: DC

## 2016-12-05 MED ORDER — AMLODIPINE BESYLATE 5 MG PO TABS: 5.0000 mg | ORAL_TABLET | Freq: Every day | ORAL | 1 refills | Status: DC

## 2016-12-05 MED FILL — AMLODIPINE BESYLATE 5 MG TA: 5 | 30 days supply | Qty: 30 | Fill #0

## 2016-12-05 NOTE — Patient Instructions (Addendum)
Adam [name] it was nice seeing you today.  For your diabetes:  -Use Lantus 45 units at bedtime.  Do not use any other kind of insulin.  -Continue using metformin and canaglifozin as before   For your high blood pressure:  -Start taking amlodipine 5 mg daily  -Continue taking lisinopril and hydrochlorothiazide as before   Return to the clinic in 2-3 weeks with your meter and medications.

## 2016-12-06 ENCOUNTER — Ambulatory Visit (INDEPENDENT_AMBULATORY_CARE_PROVIDER_SITE_OTHER): Payer: 59 | Admitting: Internal Medicine

## 2016-12-06 ENCOUNTER — Encounter: Payer: Self-pay | Admitting: Internal Medicine

## 2016-12-06 ENCOUNTER — Ambulatory Visit (HOSPITAL_COMMUNITY)
Admission: RE | Admit: 2016-12-06 | Discharge: 2016-12-06 | Disposition: A | Discharge status: Home / Self Care | Payer: 59 | Source: Ambulatory Visit | Attending: Internal Medicine | Admitting: Internal Medicine

## 2016-12-06 VITALS — BP 156/87 | HR 83 | Temp 98.0°F | Ht 73.0 in | Wt 311.9 lb

## 2016-12-06 DIAGNOSIS — Z6841 Body Mass Index (BMI) 40.0 and over, adult: Secondary | ICD-10-CM

## 2016-12-06 DIAGNOSIS — E669 Obesity, unspecified: Secondary | ICD-10-CM | POA: Diagnosis not present

## 2016-12-06 DIAGNOSIS — Z79899 Other long term (current) drug therapy: Secondary | ICD-10-CM

## 2016-12-06 DIAGNOSIS — M25552 Pain in left hip: Secondary | ICD-10-CM | POA: Diagnosis not present

## 2016-12-06 DIAGNOSIS — M25551 Pain in right hip: Secondary | ICD-10-CM | POA: Diagnosis not present

## 2016-12-06 LAB — POCT URINALYSIS DIPSTICK
BILIRUBIN UA: NEGATIVE
Ketones, UA: NEGATIVE
LEUKOCYTES UA: NEGATIVE
NITRITE UA: NEGATIVE
Protein, UA: NEGATIVE
RBC UA: NEGATIVE
Spec Grav, UA: <=1.005 — AB (ref 1.010–1.025)
UROBILINOGEN UA: 0.2 U/dL
pH, UA: 6 (ref 5.0–8.0)

## 2016-12-06 MED ORDER — BACLOFEN 10 MG PO TABS: 10.0000 mg | ORAL_TABLET | Freq: Every day | ORAL | 1 refills | Status: DC

## 2016-12-06 MED FILL — BACLOFEN 10 MG TABLET: 10 | 30 days supply | Qty: 30 | Fill #0
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
## 2016-12-06 NOTE — Progress Notes (Signed)
   CC: Left hip pain  HPI:  Mr.Koury Gaumer is a 46 y.o. male with PMH as described below who presents to clinic with left hip pain that radiates to his left flank. Please see problem based assessment and plan further details.   Past Medical History:  Diagnosis Date  . Allergy   . Depression   . Diabetes mellitus without complication ([place])   . Hyperlipidemia   . Hypertension   . Sleep apnea   . Trigger finger 01/06/2015   Review of Systems:   Review of Systems  Constitutional: Negative for chills and fever.  Respiratory: Negative for cough and shortness of breath.   Cardiovascular: Negative for chest pain.  Gastrointestinal: Negative for abdominal pain, constipation, diarrhea, nausea and vomiting.  Genitourinary: Positive for flank pain. Negative for dysuria, frequency, hematuria and urgency.  Musculoskeletal: Positive for back pain and joint pain. Negative for falls, myalgias and neck pain.  Skin: Negative for itching and rash.  Neurological: Negative for tingling, sensory change and focal weakness.  Endo/Heme/Allergies: Does not bruise/bleed easily.    Physical Exam:  Vitals:   12/06/16 1518  BP: (!) 156/87  Pulse: 83  Temp: 98 F (36.7 C)  TempSrc: Oral  SpO2: 100%  Weight: (!) 311 lb 14.4 oz (141.5 kg)  Height: 6\' 1"  (1.854 m)   General: Pleasant male, obese, in no acute distress Cardiac: regular rate and rhythm, nl S1/S2, no murmurs, rubs or gallops  Pulm: CTAB, no wheezes or crackles, no increased work of breathing  Abd: soft, NTND, bowel sounds present, no hematomas noted, no CVA tenderness Neuro: A&Ox3, sensation and strength intact in all four extremities, no motor deficits noted, DTRs 2+ in lower extremities MSK: Full range of motion of both hip joints on passive and active motion. No pain on external/internal rotation or extension/flexion of the hip bilaterally Ext: warm and well perfused, no peripheral edema  Derm: No rashes noted    Assessment &  Plan:   See Encounters Tab for problem based charting.  Patient seen with Dr. Daryll Drown

## 2016-12-06 NOTE — Assessment & Plan Note (Signed)
Assessment A1c checked in 10/30/2016 was 7. Current medication regimen includes metformin 1000 mg twice daily, canaglifozin 100 mg daily, and Lantus 40 units at bedtime. Patient reports using Lantus 42-45 units at bedtime and is also using regular insulin he had at home (8-10 units 3-4 times a week). He has lost 10 pounds in the past 3 weeks since being started on canaglifozin. Patient did not bring his meter to this visit. CBG checked in the clinic was 218.  Plan -Continue metformin -Continue canaglifozin -Increase Lantus to 45 units at bedtime -Advised patient to stop using any other kind of insulin -Return to the clinic in 2-3 weeks with his meter

## 2016-12-06 NOTE — Progress Notes (Signed)
   CC: Patient is here to discuss his diabetes. Hypertension is also discussed during this visit.  HPI:  [name] is a 46 y.o. male with a past medical history of conditions listed below presenting to the clinic to discuss his diabetes. Hypertension was also discussed during this visit. Please see problem based charting for the status of the patient's current and chronic medical conditions.   Past Medical History:  Diagnosis Date  . Allergy   . Depression   . Diabetes mellitus without complication ([place])   . Hyperlipidemia   . Hypertension   . Sleep apnea   . Trigger finger 01/06/2015   Review of Systems: Pertinent positives mentioned in HPI. Remainder of all ROS negative.   Physical Exam:  Vitals:   12/05/16 1542 12/05/16 1602  BP: (!) 163/80 (!) 151/89  Pulse: 89 86  Temp: 98.5 F (36.9 C)   TempSrc: Oral   SpO2: 95%   Weight: (!) 311 lb 9.6 oz (141.3 kg)   Height: 6' (1.829 m)    Physical Exam  Constitutional: He is oriented to person, place, and time. He appears well-developed and well-nourished. No distress.  Eyes: Right eye exhibits no discharge. Left eye exhibits no discharge.  Cardiovascular: Normal rate, regular rhythm and intact distal pulses.   Pulmonary/Chest: Effort normal and breath sounds normal. No respiratory distress. He has no wheezes. He has no rales.  Abdominal: Soft. Bowel sounds are normal. He exhibits no distension. There is no tenderness.  Musculoskeletal: He exhibits no edema.  Neurological: He is alert and oriented to person, place, and time.  Skin: Skin is warm and dry.    Assessment & Plan:   See Encounters Tab for problem based charting.  Patient discussed with Dr. Daryll Drown

## 2016-12-06 NOTE — Assessment & Plan Note (Signed)
Assessment Uncontrolled hypertension. Initial blood pressure 163/80 and repeat 151/89. Patient is currently taking lisinopril 40 mg daily and hydrochlorothiazide 25 mg daily. He reports compliance with his medications.  Plan -Start amlodipine 5 mg daily -Continue lisinopril and hydrochlorothiazide as above

## 2016-12-06 NOTE — Patient Instructions (Addendum)
Please stop taking Flexeril. Please start taking baclofen 1 tablet once a day. Do not take these 2 medications together.  I will give you a call if your urine test or your hip x-rays are abnormal.  Please call the [facility] clinic if her symptoms worsen.

## 2016-12-07 ENCOUNTER — Telehealth: Payer: Self-pay

## 2016-12-07 NOTE — Telephone Encounter (Signed)
Patient requesting xray results

## 2016-12-07 NOTE — Telephone Encounter (Signed)
Request sent to Dr Isac Sarna.

## 2016-12-10 NOTE — Telephone Encounter (Signed)
Discussed results of UA and b/l hip x ray with patient. Currently on baclofen x4d with minimal improvement. Advised he should another make an appt at [facility] if he continues to experience pain.

## 2016-12-11 NOTE — Progress Notes (Signed)
Internal Medicine Clinic Attending  Case discussed with Dr. Rathoreat the time of the visit. We reviewed the resident's history and exam and pertinent patient test results. I agree with the assessment, diagnosis, and plan of care documented in the resident's note.  

## 2016-12-12 NOTE — Progress Notes (Signed)
Internal Medicine Clinic Attending  I saw and evaluated the patient.  I personally confirmed the key portions of the history and exam documented by Dr. Isac Sarna and I reviewed pertinent patient test results.  The assessment, diagnosis, and plan were formulated together and I agree with the documentation in the resident's note.  Could also consider CT scan of the pelvis/hip/leg for further evaluation if strong concern for an underlying pathology to the chronic pain.

## 2016-12-17 MED FILL — SILDENAFIL CITRATE 50 MG TA: 50 | 30 days supply | Qty: 6 | Fill #5

## 2016-12-19 ENCOUNTER — Other Ambulatory Visit: Payer: Self-pay | Admitting: *Deleted

## 2016-12-19 DIAGNOSIS — N529 Male erectile dysfunction, unspecified: Secondary | ICD-10-CM

## 2016-12-19 MED ORDER — SILDENAFIL CITRATE 50 MG PO TABS: 50.0000 mg | ORAL_TABLET | ORAL | 1 refills | Status: DC | PRN

## 2016-12-21 DIAGNOSIS — Z79891 Long term (current) use of opiate analgesic: Secondary | ICD-10-CM | POA: Diagnosis not present

## 2016-12-21 DIAGNOSIS — M545 Low back pain: Secondary | ICD-10-CM | POA: Diagnosis not present

## 2016-12-21 DIAGNOSIS — M542 Cervicalgia: Secondary | ICD-10-CM | POA: Diagnosis not present

## 2016-12-21 DIAGNOSIS — G894 Chronic pain syndrome: Secondary | ICD-10-CM | POA: Diagnosis not present

## 2017-01-02 DIAGNOSIS — H9313 Tinnitus, bilateral: Secondary | ICD-10-CM | POA: Diagnosis not present

## 2017-01-02 DIAGNOSIS — Z87891 Personal history of nicotine dependence: Secondary | ICD-10-CM | POA: Diagnosis not present

## 2017-01-02 DIAGNOSIS — Z7289 Other problems related to lifestyle: Secondary | ICD-10-CM | POA: Diagnosis not present

## 2017-01-02 DIAGNOSIS — H903 Sensorineural hearing loss, bilateral: Secondary | ICD-10-CM | POA: Diagnosis not present

## 2017-01-02 DIAGNOSIS — J342 Deviated nasal septum: Secondary | ICD-10-CM | POA: Diagnosis not present

## 2017-01-02 DIAGNOSIS — J343 Hypertrophy of nasal turbinates: Secondary | ICD-10-CM | POA: Diagnosis not present

## 2017-01-02 DIAGNOSIS — J302 Other seasonal allergic rhinitis: Secondary | ICD-10-CM | POA: Diagnosis not present

## 2017-01-02 DIAGNOSIS — Z570 Occupational exposure to noise: Secondary | ICD-10-CM | POA: Diagnosis not present

## 2017-01-03 MED FILL — AMLODIPINE BESYLATE 5 MG TA: 5 | 30 days supply | Qty: 30 | Fill #1

## 2017-01-07 ENCOUNTER — Encounter: Payer: Self-pay | Admitting: Student in an Organized Health Care Education/Training Program

## 2017-01-07 ENCOUNTER — Ambulatory Visit (INDEPENDENT_AMBULATORY_CARE_PROVIDER_SITE_OTHER): Payer: 59 | Admitting: Student in an Organized Health Care Education/Training Program

## 2017-01-07 VITALS — BP 135/77 | HR 90 | Temp 97.7°F | Ht 73.0 in | Wt 310.0 lb

## 2017-01-07 DIAGNOSIS — Z23 Encounter for immunization: Secondary | ICD-10-CM

## 2017-01-07 DIAGNOSIS — I10 Essential (primary) hypertension: Secondary | ICD-10-CM

## 2017-01-07 DIAGNOSIS — E119 Type 2 diabetes mellitus without complications: Secondary | ICD-10-CM | POA: Diagnosis not present

## 2017-01-07 DIAGNOSIS — M25552 Pain in left hip: Secondary | ICD-10-CM | POA: Diagnosis not present

## 2017-01-07 DIAGNOSIS — Z794 Long term (current) use of insulin: Secondary | ICD-10-CM | POA: Diagnosis not present

## 2017-01-07 MED ORDER — INSULIN REGULAR HUMAN 100 UNIT/ML IJ SOLN: INTRAMUSCULAR | 3 refills | Status: DC

## 2017-01-07 MED ORDER — TADALAFIL 20 MG PO TABS: 20.0000 mg | ORAL_TABLET | Freq: Every day | ORAL | 0 refills | Status: DC | PRN

## 2017-01-07 MED FILL — CIALIS 20 MG TABLET: 20 | 30 days supply | Qty: 5 | Fill #0

## 2017-01-07 NOTE — Patient Instructions (Addendum)
1. You are doing a great job with your weight, nutrition, and exercise. Keep up the great work.   2. I have referred you to [facility] to discuss weight loss surgery options.   3. I have also referred you to podiatry, many options about which clinic, to help with your callus and make sure we do not develop a diabetic wound.   4. Continue your medications as prescribed.   5. Lets follow up in 6 weeks, we can check on your blood sugars, and your weight.

## 2017-01-07 NOTE — Assessment & Plan Note (Signed)
Left lateral hip pain seems most consistent with gluteal tendinopathy. We talked about the natural progression of this condition, plan to use anti-inflammatories as needed for flares of pain. Can offer steroid injection in the future if needed.

## 2017-01-07 NOTE — Assessment & Plan Note (Signed)
Patient reports uncontrolled hyperglycemia over the last few weeks, we try to switch him from pioglitazone to canagliflozin at our last visit. He reports many postprandial glucose levels over 300. He purchased regular insulin from [place] and started using it as a sliding scale. Still using Lantus 40 units daily at bedtime which is fine to continue. I'm going to move him back to pioglitazone 45 mg daily and stop the SGLT2 inhibitor for now. Follow-up in 6 weeks and we can check an A1c then.   I did notice he had a small callus under his fifth MTP of the left foot. We did ABIs today which were normal at 1.05. I referred him to podiatry for foot care.

## 2017-01-07 NOTE — Assessment & Plan Note (Signed)
Long history of obesity which has been making it hard to control his diabetes and osteoarthritis which causes him chronic pain. Weight today 310 pounds, height 6 foot 1 inch, BMI 40.9. He has lost 11 pounds in the last 2 months with improved nutrition and occasional exercise. He has a lot of insight into the benefits of further weight loss for his diabetes control and chronic pain. He is interested in weight reduction surgery which I think is a good idea. He qualifies given his BMI and difficult to control diabetes. Unfortunately in the past he has been unable to tolerate Victoza because of palpitations, or Invokana because of hyperglycemia. We set a goal today of continuing improvement nutrition and exercising on a more regular basis. I referred him to [facility] surgery to start the process and educational classes.

## 2017-01-07 NOTE — Assessment & Plan Note (Signed)
Blood pressure well controlled today. Plan to continue amlodipine, HCTZ, and lisinopril.

## 2017-01-07 NOTE — Progress Notes (Signed)
   Assessment and Plan:  See Encounters tab for problem-based medical decision making.   __________________________________________________________  HPI:   46 year old man here for follow-up of diabetes. At her last visit we try to change him from pioglitazone to canagliflozin. He reports that he has noticed more elevated sugars at home recently. He even started taking regular insulin on a sliding scale which she used to do in the past. He says he can feel when his sugars are high, so he is having symptomatic hyperglycemia. Denies polyuria, does have some baseline polydipsia, says he drinks 7-8 glasses of water per day. He does report improvement in his nutrition and has noticed weight loss over the last few weeks. He reports being interested in weight reduction surgery. His wife had gastric bypass surgery and has done very well. Denies any chest pain, pressure, or dyspnea with exercise. Reports good compliance with his other medications. Feels like Viagra is not as effective and giving him headaches, thinks that Cialis might be more effective as it has been in the past.  __________________________________________________________  Problem List: Patient Active Problem List   Diagnosis Date Noted  . Obesity, Class III, BMI 40-49.9 (morbid obesity) ([place]) 11/12/2016    Priority: High  . Diabetes ([place]) 01/06/2015    Priority: High  . Hearing loss 11/12/2016    Priority: Medium  . Osteoarthritis of lumbar spine 04/09/2016    Priority: Medium  . Tendinopathy of left rotator cuff 12/30/2015    Priority: Medium  . Obstructive sleep apnea 02/28/2015    Priority: Medium  . Essential hypertension 01/06/2015    Priority: Medium  . Lateral pain of left hip 12/06/2016    Priority: Low  . Erectile dysfunction 01/06/2015    Priority: Low  . Healthcare maintenance 01/06/2015    Priority: Low    Medications: Reconciled today in  Epic __________________________________________________________  Physical Exam:  Vital Signs: Vitals:   01/07/17 0850  BP: 135/77  Pulse: 90  Temp: 97.7 F (36.5 C)  TempSrc: Oral  SpO2: 100%  Weight: (!) 310 lb (140.6 kg)  Height: 6\' 1"  (1.854 m)    Gen: Well appearing, NAD Neck: No cervical LAD, No thyromegaly or nodules, No JVD. CV: RRR, no murmurs Pulm: Normal effort, CTA throughout, no wheezing Abd: Soft, NT, ND, normal BS.  Ext: Warm, no edema, normal joints. Small calus on the left MTP, feet are warm with diminished DP pulses. Skin: No atypical appearing moles. No rashes

## 2017-01-15 ENCOUNTER — Other Ambulatory Visit: Payer: Self-pay | Admitting: Otolaryngology

## 2017-01-18 ENCOUNTER — Telehealth: Payer: Self-pay | Admitting: Student in an Organized Health Care Education/Training Program

## 2017-01-18 DIAGNOSIS — E119 Type 2 diabetes mellitus without complications: Secondary | ICD-10-CM

## 2017-01-18 DIAGNOSIS — Z794 Long term (current) use of insulin: Principal | ICD-10-CM

## 2017-01-18 MED ORDER — INSULIN GLARGINE 100 UNIT/ML ~~LOC~~ SOLN: 50.0000 [IU] | Freq: Every day | SUBCUTANEOUS | 5 refills | Status: DC

## 2017-01-18 MED FILL — HYDROCHLOROTHIAZIDE 25 MG T: 25 | 90 days supply | Qty: 90 | Fill #1

## 2017-01-18 MED FILL — LANTUS 100 UNITS/ML VIAL: 100 | 20 days supply | Qty: 10 | Fill #0

## 2017-01-18 MED FILL — DULoxetine HCL 60 MG CPEP: 60 | 90 days supply | Qty: 90 | Fill #3

## 2017-01-18 MED FILL — PIOGLITAZONE HCL 45 MG TAB: 45 | 90 days supply | Qty: 90 | Fill #2

## 2017-01-18 NOTE — Telephone Encounter (Signed)
I clarified the lantus dose is now 50 units nightly, and [facility].

## 2017-01-18 NOTE — Telephone Encounter (Signed)
Refill Request- Pt would like a call back to discuss the dosage as well   insulin glargine (LANTUS) 100 UNIT/ML injection

## 2017-01-28 ENCOUNTER — Ambulatory Visit (INDEPENDENT_AMBULATORY_CARE_PROVIDER_SITE_OTHER): Payer: 59 | Admitting: Podiatry

## 2017-01-28 ENCOUNTER — Other Ambulatory Visit: Payer: Self-pay | Admitting: Internal Medicine

## 2017-01-28 DIAGNOSIS — Q828 Other specified congenital malformations of skin: Secondary | ICD-10-CM

## 2017-01-28 DIAGNOSIS — L600 Ingrowing nail: Secondary | ICD-10-CM | POA: Diagnosis not present

## 2017-01-28 DIAGNOSIS — M545 Low back pain: Secondary | ICD-10-CM | POA: Diagnosis not present

## 2017-01-28 DIAGNOSIS — G894 Chronic pain syndrome: Secondary | ICD-10-CM | POA: Diagnosis not present

## 2017-01-28 DIAGNOSIS — M542 Cervicalgia: Secondary | ICD-10-CM | POA: Diagnosis not present

## 2017-01-28 DIAGNOSIS — Z79891 Long term (current) use of opiate analgesic: Secondary | ICD-10-CM | POA: Diagnosis not present

## 2017-01-28 DIAGNOSIS — M47816 Spondylosis without myelopathy or radiculopathy, lumbar region: Secondary | ICD-10-CM

## 2017-01-28 MED FILL — CYCLOBENZAPRINE 10 MG TAB: 10 | 10 days supply | Qty: 30 | Fill #0

## 2017-01-29 NOTE — Progress Notes (Signed)
   Subjective: Patient presents today for evaluation of pain to the medial border of the right great toe that has been present for the past two months. Patient is concerned for possible ingrown nail. He also reports a painful callus lesion to the lateral left forefoot. He has not done anything to treat the symptoms. Patient presents today for further treatment and evaluation.   Past Medical History:  Diagnosis Date  . Allergy   . Depression   . Diabetes mellitus without complication ([place])   . Hyperlipidemia   . Hypertension   . Sleep apnea   . Trigger finger 01/06/2015    Objective:  General: Well developed, nourished, in no acute distress, alert and oriented x3   Dermatology: Skin is warm, dry and supple bilateral. Medial border of right great toe appears to be erythematous with evidence of an ingrowing nail. Pain on palpation noted to the border of the nail fold. Hyperkeratotic lesion present on the left foot. Pain on palpation with a central nucleated core noted. The remaining nails appear unremarkable at this time. There are no open sores, lesions.  Vascular: Dorsalis Pedis artery and Posterior Tibial artery pedal pulses palpable. No lower extremity edema noted.   Neruologic: Grossly intact via light touch bilateral.  Musculoskeletal: Muscular strength within normal limits in all groups bilateral. Normal range of motion noted to all pedal and ankle joints.   Assesement: #1 Paronychia with ingrowing nail medial border right great toe #2 Pain in toe #3 Incurvated nail #4 porokeratosis left foot  Plan of Care:  1. Patient evaluated.  2. Mechanical debridement of right great toenail performed using a nail nipper. Filed with dremel without incident.  3. Excisional debridement of keratoic lesion using a chisel blade was performed without incident.  4. Treated area(s) with Salinocaine and dressed with light dressing. 5. Return to clinic when necessary.   Edrick Kins,  DPM [facility]  Dr. Edrick Kins, [address]                                        [place], [place] [address]                Office [phone]  Fax [phone]

## 2017-01-31 MED FILL — CIALIS 20 MG TABLET: 20 | 30 days supply | Qty: 5 | Fill #1

## 2017-02-05 ENCOUNTER — Other Ambulatory Visit: Payer: Self-pay | Admitting: Internal Medicine

## 2017-02-05 DIAGNOSIS — I10 Essential (primary) hypertension: Secondary | ICD-10-CM

## 2017-02-05 MED FILL — metFORMIN HCL 1000 MG TABS: 1000 | 90 days supply | Qty: 180 | Fill #7

## 2017-02-05 MED FILL — AMLODIPINE BESYLATE 5 MG TA: 5 | 90 days supply | Qty: 90 | Fill #0

## 2017-02-05 MED FILL — SIMVASTATIN 20 MG TABLET: 20 | 90 days supply | Qty: 90 | Fill #3

## 2017-02-08 MED FILL — LANTUS 100 UNITS/ML VIAL: 100 | 80 days supply | Qty: 40 | Fill #1

## 2017-03-01 DIAGNOSIS — G894 Chronic pain syndrome: Secondary | ICD-10-CM | POA: Diagnosis not present

## 2017-03-01 DIAGNOSIS — Z79891 Long term (current) use of opiate analgesic: Secondary | ICD-10-CM | POA: Diagnosis not present

## 2017-03-01 DIAGNOSIS — M542 Cervicalgia: Secondary | ICD-10-CM | POA: Diagnosis not present

## 2017-03-01 DIAGNOSIS — M545 Low back pain: Secondary | ICD-10-CM | POA: Diagnosis not present

## 2017-03-04 MED FILL — LISINOPRIL 40 MG TABLET: 40 | 90 days supply | Qty: 90 | Fill #3

## 2017-03-19 ENCOUNTER — Encounter (HOSPITAL_BASED_OUTPATIENT_CLINIC_OR_DEPARTMENT_OTHER): Payer: Self-pay | Admitting: *Deleted

## 2017-03-19 ENCOUNTER — Other Ambulatory Visit: Payer: Self-pay | Admitting: Student in an Organized Health Care Education/Training Program

## 2017-03-19 MED FILL — CYCLOBENZAPRINE 10 MG TAB: 10 | 10 days supply | Qty: 30 | Fill #1

## 2017-03-19 NOTE — Pre-Procedure Instructions (Signed)
Obese patient with hx. of sleep apnea having nasal surgery is not a candidate for [facility], per Dr. Seward Speck.  Butch Penny at Dr. Victorio Palm office notified, and pt. notified of same.

## 2017-03-20 MED FILL — TADALAFIL 20 MG TABS: 20 | 30 days supply | Qty: 6 | Fill #0

## 2017-03-21 ENCOUNTER — Encounter (HOSPITAL_COMMUNITY): Payer: Self-pay | Admitting: *Deleted

## 2017-03-21 ENCOUNTER — Other Ambulatory Visit: Payer: Self-pay

## 2017-03-21 NOTE — Progress Notes (Signed)
Pt denies SOB, chest pain, and being under the care of a cardiologist. Pt stated that a stress test was performed > 6 years ago but denies having an echo and cardiac cath. Pt denies having a chest x ray and EKG within the last year. Pt denies recent labs. Pt made aware to stop taking vitamins, fish oil and herbal medications. Do not take any NSAIDs ie: Ibuprofen, Advil, Naproxen (Aleve), Motrin, BC and Goody Powder. Pt made aware to take half dose of Lantus at HS (25 units). Pt made aware to not take any medications the morning of surgery (including no Metformin and Actos).  Pt made aware to check BG every 2 hours prior to arrival to hospital on DOS. Pt made aware to treat a BG < 70 with  4 ounces of apple or cranberry juice, wait 15 minutes after drinking juice to recheck BG, if BG remains < 70, call Short Stay unit to speak with a nurse. Pt verbalized understanding of all pre-op instructions.

## 2017-03-22 ENCOUNTER — Ambulatory Visit (HOSPITAL_COMMUNITY): Payer: 59 | Admitting: Certified Registered"

## 2017-03-22 ENCOUNTER — Ambulatory Visit (HOSPITAL_COMMUNITY)
Admission: RE | Admit: 2017-03-22 | Discharge: 2017-03-22 | Disposition: A | Discharge status: Home / Self Care | Payer: 59 | Source: Ambulatory Visit | Attending: Otolaryngology | Admitting: Otolaryngology

## 2017-03-22 ENCOUNTER — Encounter (HOSPITAL_COMMUNITY)
Admission: RE | Disposition: A | Discharge status: Home / Self Care | Payer: Self-pay | Source: Ambulatory Visit | Attending: Otolaryngology

## 2017-03-22 DIAGNOSIS — Z87891 Personal history of nicotine dependence: Secondary | ICD-10-CM | POA: Diagnosis not present

## 2017-03-22 DIAGNOSIS — I10 Essential (primary) hypertension: Secondary | ICD-10-CM | POA: Insufficient documentation

## 2017-03-22 DIAGNOSIS — J342 Deviated nasal septum: Secondary | ICD-10-CM | POA: Insufficient documentation

## 2017-03-22 DIAGNOSIS — Z6841 Body Mass Index (BMI) 40.0 and over, adult: Secondary | ICD-10-CM | POA: Insufficient documentation

## 2017-03-22 DIAGNOSIS — Z79899 Other long term (current) drug therapy: Secondary | ICD-10-CM | POA: Insufficient documentation

## 2017-03-22 DIAGNOSIS — E119 Type 2 diabetes mellitus without complications: Secondary | ICD-10-CM | POA: Diagnosis not present

## 2017-03-22 DIAGNOSIS — Z794 Long term (current) use of insulin: Secondary | ICD-10-CM | POA: Diagnosis not present

## 2017-03-22 DIAGNOSIS — G4730 Sleep apnea, unspecified: Secondary | ICD-10-CM | POA: Insufficient documentation

## 2017-03-22 DIAGNOSIS — J343 Hypertrophy of nasal turbinates: Secondary | ICD-10-CM | POA: Diagnosis not present

## 2017-03-22 HISTORY — DX: Reserved for inherently not codable concepts without codable children: IMO0001

## 2017-03-22 HISTORY — DX: Deviated nasal septum: J34.2

## 2017-03-22 HISTORY — DX: Unspecified osteoarthritis, unspecified site: M19.90

## 2017-03-22 HISTORY — DX: Morbid (severe) obesity due to excess calories: E66.01

## 2017-03-22 HISTORY — DX: Long term (current) use of insulin: Z79.4

## 2017-03-22 HISTORY — PX: NASAL SEPTOPLASTY W/ TURBINOPLASTY: SHX2070

## 2017-03-22 HISTORY — DX: Type 2 diabetes mellitus without complications: E11.9

## 2017-03-22 LAB — BASIC METABOLIC PANEL
ANION GAP: 9 (ref 5–15)
BUN: 12 mg/dL (ref 6–20)
CHLORIDE: 101 mmol/L (ref 101–111)
CO2: 27 mmol/L (ref 22–32)
Calcium: 8.9 mg/dL (ref 8.9–10.3)
Creatinine, Ser: 0.7 mg/dL (ref 0.61–1.24)
GFR calc Af Amer: >60 mL/min (ref >60)
GFR calc non Af Amer: >60 mL/min (ref >60)
GLUCOSE: 204 mg/dL — AB (ref 65–99)
POTASSIUM: 3.9 mmol/L (ref 3.5–5.1)
Sodium: 137 mmol/L (ref 135–145)

## 2017-03-22 LAB — HEMOGLOBIN: Hemoglobin: 14.5 g/dL (ref 13.0–17.0)

## 2017-03-22 LAB — GLUCOSE, CAPILLARY
GLUCOSE-CAPILLARY: 172 mg/dL — AB (ref 65–99)
Glucose-Capillary: 216 mg/dL — ABNORMAL HIGH (ref 65–99)

## 2017-03-22 SURGERY — SEPTOPLASTY, NOSE, WITH NASAL TURBINATE REDUCTION
Anesthesia: General | Site: Nose | Laterality: Bilateral

## 2017-03-22 MED ORDER — PROPOFOL 10 MG/ML IV BOLUS
INTRAVENOUS | Status: DC | PRN
  Administered 2017-03-22: 200 mg via INTRAVENOUS

## 2017-03-22 MED ORDER — SCOPOLAMINE 1 MG/3DAYS TD PT72: 1.0000 | MEDICATED_PATCH | TRANSDERMAL | Status: DC

## 2017-03-22 MED ORDER — OXYMETAZOLINE HCL 0.05 % NA SOLN
NASAL | Status: DC | PRN
  Administered 2017-03-22: 1 via TOPICAL

## 2017-03-22 MED ORDER — 0.9 % SODIUM CHLORIDE (POUR BTL) OPTIME
TOPICAL | Status: DC | PRN
  Administered 2017-03-22: 1000 mL

## 2017-03-22 MED ORDER — LIDOCAINE-EPINEPHRINE 1 %-1:100000 IJ SOLN
INTRAMUSCULAR | Status: AC
  Filled 2017-03-22: qty 1

## 2017-03-22 MED ORDER — ONDANSETRON HCL 4 MG/2ML IJ SOLN
INTRAMUSCULAR | Status: AC
  Filled 2017-03-22: qty 6

## 2017-03-22 MED ORDER — DEXAMETHASONE SODIUM PHOSPHATE 10 MG/ML IJ SOLN
INTRAMUSCULAR | Status: AC
  Filled 2017-03-22: qty 3

## 2017-03-22 MED ORDER — LIDOCAINE-EPINEPHRINE 1 %-1:100000 IJ SOLN
INTRAMUSCULAR | Status: DC | PRN
  Administered 2017-03-22: 20 mL

## 2017-03-22 MED ORDER — FENTANYL CITRATE (PF) 250 MCG/5ML IJ SOLN
INTRAMUSCULAR | Status: AC
Start: 2017-03-22 — End: 2017-03-22
  Filled 2017-03-22: qty 5

## 2017-03-22 MED ORDER — HYDROCODONE-ACETAMINOPHEN 5-325 MG PO TABS: 1.0000 | ORAL_TABLET | Freq: Four times a day (QID) | ORAL | 0 refills | Status: DC | PRN

## 2017-03-22 MED ORDER — ROCURONIUM BROMIDE 10 MG/ML (PF) SYRINGE
PREFILLED_SYRINGE | INTRAVENOUS | Status: DC | PRN
  Administered 2017-03-22: 50 mg via INTRAVENOUS

## 2017-03-22 MED ORDER — AMOXICILLIN-POT CLAVULANATE 500-125 MG PO TABS: 1.0000 | ORAL_TABLET | Freq: Two times a day (BID) | ORAL | 0 refills | Status: DC

## 2017-03-22 MED ORDER — HYDROMORPHONE HCL 1 MG/ML IJ SOLN: 0.2500 mg | INTRAMUSCULAR | Status: DC | PRN

## 2017-03-22 MED ORDER — SUGAMMADEX SODIUM 200 MG/2ML IV SOLN
INTRAVENOUS | Status: DC | PRN
  Administered 2017-03-22: 300 mg via INTRAVENOUS

## 2017-03-22 MED ORDER — FENTANYL CITRATE (PF) 250 MCG/5ML IJ SOLN
INTRAMUSCULAR | Status: DC | PRN
  Administered 2017-03-22: 50 ug via INTRAVENOUS
  Administered 2017-03-22: 150 ug via INTRAVENOUS

## 2017-03-22 MED ORDER — PROMETHAZINE HCL 25 MG/ML IJ SOLN: 6.2500 mg | INTRAMUSCULAR | Status: DC | PRN

## 2017-03-22 MED ORDER — DEXAMETHASONE SODIUM PHOSPHATE 10 MG/ML IJ SOLN
INTRAMUSCULAR | Status: DC | PRN
  Administered 2017-03-22: 4 mg via INTRAVENOUS

## 2017-03-22 MED ORDER — CEFAZOLIN SODIUM-DEXTROSE 2-4 GM/100ML-% IV SOLN
2000.0000 mg | Freq: Once | INTRAVENOUS | Status: AC
  Administered 2017-03-22: 2000 mg via INTRAVENOUS

## 2017-03-22 MED ORDER — MIDAZOLAM HCL 2 MG/2ML IJ SOLN
INTRAMUSCULAR | Status: DC | PRN
  Administered 2017-03-22: 2 mg via INTRAVENOUS

## 2017-03-22 MED ORDER — MUPIROCIN 2 % EX OINT
TOPICAL_OINTMENT | CUTANEOUS | Status: AC
  Filled 2017-03-22: qty 22

## 2017-03-22 MED ORDER — STERILE WATER FOR IRRIGATION IR SOLN
Status: DC | PRN
  Administered 2017-03-22: 300 mL

## 2017-03-22 MED ORDER — LACTATED RINGERS IV SOLN
INTRAVENOUS | Status: DC
  Administered 2017-03-22: 10:00:00 via INTRAVENOUS

## 2017-03-22 MED ORDER — SUCCINYLCHOLINE CHLORIDE 20 MG/ML IJ SOLN
INTRAMUSCULAR | Status: DC | PRN
  Administered 2017-03-22: 160 mg via INTRAVENOUS

## 2017-03-22 MED ORDER — OXYMETAZOLINE HCL 0.05 % NA SOLN
1.0000 | Freq: Two times a day (BID) | NASAL | Status: DC
  Administered 2017-03-22: 2 via NASAL
  Filled 2017-03-22: qty 15

## 2017-03-22 MED ORDER — MUPIROCIN 2 % EX OINT
TOPICAL_OINTMENT | CUTANEOUS | Status: DC | PRN
  Administered 2017-03-22: 1 via NASAL

## 2017-03-22 MED ORDER — LIDOCAINE 2% (20 MG/ML) 5 ML SYRINGE
INTRAMUSCULAR | Status: DC | PRN
  Administered 2017-03-22: 100 mg via INTRAVENOUS

## 2017-03-22 MED ORDER — MUPIROCIN CALCIUM 2 % EX CREA
TOPICAL_CREAM | CUTANEOUS | Status: AC
  Filled 2017-03-22: qty 15

## 2017-03-22 MED ORDER — ONDANSETRON HCL 4 MG/2ML IJ SOLN
INTRAMUSCULAR | Status: DC | PRN
  Administered 2017-03-22: 4 mg via INTRAVENOUS

## 2017-03-22 MED ORDER — OXYMETAZOLINE HCL 0.05 % NA SOLN
NASAL | Status: AC
  Filled 2017-03-22: qty 15

## 2017-03-22 MED ORDER — MIDAZOLAM HCL 2 MG/2ML IJ SOLN
INTRAMUSCULAR | Status: AC
  Filled 2017-03-22: qty 2

## 2017-03-22 MED FILL — HYDROCODON-APAP 5-325: 5-325 | 3 days supply | Qty: 20 | Fill #0

## 2017-03-22 MED FILL — AMOX-CLAV 500-125 MG TABLET: 500-125 | 10 days supply | Qty: 20 | Fill #0

## 2017-03-22 SURGICAL SUPPLY — 28 items
BLADE SURG 15 STRL LF DISP TIS (BLADE) ×1 IMPLANT
BLADE SURG 15 STRL SS (BLADE) ×1
CANISTER SUCT 3000ML PPV (MISCELLANEOUS) ×2 IMPLANT
COAGULATOR SUCT 8FR VV (MISCELLANEOUS) ×2 IMPLANT
DRAPE HALF SHEET 40X57 (DRAPES) IMPLANT
ELECT REM PT RETURN 9FT ADLT (ELECTROSURGICAL) ×2
ELECTRODE REM PT RTRN 9FT ADLT (ELECTROSURGICAL) ×1 IMPLANT
GAUZE SPONGE 2X2 8PLY STRL LF (GAUZE/BANDAGES/DRESSINGS) ×1 IMPLANT
GLOVE BIOGEL M 7.0 STRL (GLOVE) ×4 IMPLANT
GLOVE BIOGEL PI IND STRL 6.5 (GLOVE) ×1 IMPLANT
GLOVE BIOGEL PI INDICATOR 6.5 (GLOVE) ×1
GLOVE SURG SS PI 6.5 STRL IVOR (GLOVE) ×2 IMPLANT
GOWN STRL REUS W/ TWL LRG LVL3 (GOWN DISPOSABLE) ×2 IMPLANT
GOWN STRL REUS W/TWL LRG LVL3 (GOWN DISPOSABLE) ×2
KIT BASIN OR (CUSTOM PROCEDURE TRAY) ×2 IMPLANT
KIT ROOM TURNOVER OR (KITS) ×2 IMPLANT
NEEDLE HYPO 25GX1X1/2 BEV (NEEDLE) ×2 IMPLANT
NS IRRIG 1000ML POUR BTL (IV SOLUTION) ×2 IMPLANT
PAD ARMBOARD 7.5X6 YLW CONV (MISCELLANEOUS) ×2 IMPLANT
SPLINT NASAL DOYLE BI-VL (GAUZE/BANDAGES/DRESSINGS) ×2 IMPLANT
SPONGE GAUZE 2X2 STER 10/PKG (GAUZE/BANDAGES/DRESSINGS) ×1
SPONGE NEURO XRAY DETECT 1X3 (DISPOSABLE) ×2 IMPLANT
SUT ETHILON 3 0 PS 1 (SUTURE) ×2 IMPLANT
SUT PLAIN 4 0 ~~LOC~~ 1 (SUTURE) ×2 IMPLANT
TOWEL OR 17X24 6PK STRL BLUE (TOWEL DISPOSABLE) ×2 IMPLANT
TRAY ENT MC OR (CUSTOM PROCEDURE TRAY) ×2 IMPLANT
TUBE SALEM SUMP 16 FR W/ARV (TUBING) ×2 IMPLANT
TUBING EXTENTION W/L.L. (IV SETS) ×2 IMPLANT

## 2017-03-22 NOTE — Anesthesia Procedure Notes (Signed)
Procedure Name: Intubation Date/Time: 03/22/2017 12:06 PM Performed by: Julieta Bellini, CRNA Pre-anesthesia Checklist: Emergency Drugs available, Patient identified, Suction available and Patient being monitored Patient Re-evaluated:Patient Re-evaluated prior to induction Oxygen Delivery Method: Circle system utilized Preoxygenation: Pre-oxygenation with 100% oxygen Induction Type: IV induction Ventilation: Mask ventilation without difficulty Laryngoscope Size: Mac and 4 Grade View: Grade III Tube type: Oral Tube size: 7.5 mm Number of attempts: 1 Airway Equipment and Method: Stylet Placement Confirmation: ETT inserted through vocal cords under direct vision,  positive ETCO2 and breath sounds checked- equal and bilateral Secured at: 23 cm Tube secured with: Tape Dental Injury: Teeth and Oropharynx as per pre-operative assessment

## 2017-03-22 NOTE — Op Note (Signed)
Operative Note:  Nasal Septoplasty    Bilateral Inferior Turbinate Reduction   Patient: Adam [name]  Medical record number: [id number]  Date: 03/22/2017  Pre-operative Indications:  Nasal septal deviation     Inf Turbinate Hypertrophy  Postoperative Indications: Same  Surgical Procedure: Nasal Septoplasty     Bilateral Inferior Turbinate Hypertrophy  Anesthesia: GET  Surgeon: Delsa Bern, M.D.  Complications: None  EBL: 100 cc   Brief History: The patient is a 46 y.o. male with a history of progressive nasal airway obstruction. The patient has a history of nasal obstruction and physical examination shows severe nasal septal deviation and inferior turbinate hypertrophy. Based on the patient's history and findings I recommended nasal septoplasty and inferior turbinate reduction under general anesthesia, risks and benefits were discussed in detail with the patient. They understand and agree with our plan for surgery which is scheduled on elective basis at [facility] main OR.  Surgical Procedure: The patient is brought to the operating room on 03/22/2017 and placed in supine position on the operating table. General endotracheal anesthesia was established without difficulty. When the patient was adequately anesthetized, surgical timeout was performed and correct identification of the patient and the surgical procedure. The patient's nose was then injected with 5 cc of 1% lidocaine 1 100,000 dilution epinephrine which was injected in a submucosal fashion along the nasal septum and inferior turbinates bilaterally. The patient's nose was then packed with Afrin-soaked cottonoid pledgets which were left in place for approximately 10 minutes to allow for vasoconstriction and hemostasis. The patient was positioned and prepped and draped in sterile fashion.  The patient's nasal cavity was inspected, they had a severely deviated nasal septum. Septoplasty was undertaken beginning with a right  anterior hemitransfixion incision which was carried to the mucosa and underlying submucosa. A mucoperichondrial flap was elevated on the right side. The anterior septal cartilage was crossed and a mucoperichondrial flap was elevated on the opposite side. This allowed exposure of the entire nasal septum. Anterior septal cartilage was resected, the cartilage tissue was morcellized and later returned to the mucoperichondrial pocket. Dissection was then carried from anterior to posterior removing deviated bone and cartilage. Inferior bony septal spur on the right was elevated with blunt and sharp dissection and removed, preserving the overlying mucosa. Suction cautery used along the maxillary crest. With the septum brought to a good stable midline position, the morcellized cartilage was returned to the mucoperichondrial pocket. The mucosal flaps are then reapproximated with a 4-0 gut suture on a Keith needle in a horizontal matching fashion. The anterior hemitransfixion incision was closed with the same suture. At the conclusion of the surgical procedure, Doyle nasal septal splints were placed after the application of Bactroban ointment and sutured into position with 3-0 Ethilon suture.  Turbinate reduction was then performed. Using bipolar intramural cautery set at 12 W, 2 submucosal passes made in each inferior turbinate. Vertical incisions were created in the anterior aspect of each inferior turbinate, soft tissue was elevated and a small amount of turbinate bone was resected. The turbinates were then outfractured creating a widely patent nasal cavity. There was minimal bleeding.  An orogastric tube was passed and the stomach contents were aspirated. Oropharynx was suctioned, no loose or broken teeth. No active bleeding. The patient was awakened from their anesthetic and transferred from the operating room to the recovery room in stable condition. There were no complications and blood loss was 100 mL.   Delsa Bern, M.D. [facility] 03/22/2017

## 2017-03-22 NOTE — H&P (Signed)
Adam [name] is an 46 y.o. male.   Chief Complaint: Nasal airway obstruction HPI: History of progressive nasal airway obstruction with deviated septum and turbinate hypertrophy.  Past Medical History:  Diagnosis Date  . Arthritis    lower back, hands  . Deviated septum    nasal turbinate hypertrophy  . Hypertension    states under control with meds., has been on medication since age 17  . Insulin dependent diabetes mellitus ([place])   . Morbid obesity ([place])   . Sleep apnea    uses CPAP "most of the time", per pt.    Past Surgical History:  Procedure Laterality Date  . CARPAL TUNNEL RELEASE Right 07/2014  . LUMBAR FUSION    . SHOULDER ARTHROSCOPY Left    x 2  . TONSILLECTOMY AND ADENOIDECTOMY    . TYMPANOSTOMY TUBE PLACEMENT Bilateral   . WISDOM TOOTH EXTRACTION      Family History  Problem Relation Age of Onset  . Hyperlipidemia Mother   . Aneurysm Mother   . Diabetes Father   . Hyperlipidemia Father    Social History:  reports that  has never smoked. He has quit using smokeless tobacco. His smokeless tobacco use included chew. He reports that he drinks alcohol. He reports that he does not use drugs.  Allergies: No Known Allergies  Medications Prior to Admission  Medication Sig Dispense Refill  . amLODipine (NORVASC) 5 MG tablet Take 5 mg by mouth daily.    Marland Kitchen CIALIS 20 MG tablet TAKE 1 TABLET BY MOUTH DAILY AS NEEDED FOR ERECTILE DYSFUNCTION. 10 tablet 0  . DULoxetine (CYMBALTA) 60 MG capsule Take 60 mg by mouth daily.    . fluticasone (FLONASE) 50 MCG/ACT nasal spray Place 2 sprays into both nostrils daily as needed for allergies or rhinitis.    . hydrochlorothiazide (HYDRODIURIL) 25 MG tablet Take 25 mg by mouth daily.    . insulin glargine (LANTUS) 100 UNIT/ML injection Inject 0.5 mLs (50 Units total) into the skin at bedtime. 10 mL 5  . insulin regular (NOVOLIN R,HUMULIN R) 100 units/mL injection Sliding Scale: 2 units for every 50 over 200. 10 mL 3  . lisinopril  (PRINIVIL,ZESTRIL) 40 MG tablet Take 1 tablet (40 mg total) by mouth daily. 90 tablet 3  . metFORMIN (GLUCOPHAGE) 1000 MG tablet TAKE 1 TABLET BY MOUTH 2 TIMES DAILY WITH A MEAL. (Patient taking differently: TAKE 1000 mg  BY MOUTH 2 TIMES DAILY WITH A MEAL.) 90 tablet PRN  . NON FORMULARY CPAP    . pioglitazone (ACTOS) 45 MG tablet Take 45 mg by mouth daily.    . simvastatin (ZOCOR) 20 MG tablet Take 1 tablet (20 mg total) by mouth daily. 30 tablet 11    Results for orders placed or performed during the hospital encounter of 03/22/17 (from the past 48 hour(s))  Glucose, capillary     Status: Abnormal   Collection Time: 03/22/17  9:57 AM  Result Value Ref Range   Glucose-Capillary 216 (H) 65 - 99 mg/dL   No results found.  Review of Systems  Constitutional: Negative.   HENT: Positive for congestion.   Respiratory: Negative.   Cardiovascular: Negative.     Blood pressure (!) 172/88, pulse 74, temperature 98.1 F (36.7 C), temperature source Oral, resp. rate 18, height 6' (1.829 m), weight (!) 140.6 kg (310 lb), SpO2 96 %. Physical Exam  Constitutional: He appears well-developed and well-nourished.  HENT:  Deviated septum and turbinate hypertrophy with nasal airway obstruction.  Neck:  Normal range of motion. Neck supple.  Cardiovascular: Normal rate.  Respiratory: Effort normal.  GI: Soft.     Assessment/Plan Pt for septoplasty and IT reduction as an OP under GA.  Lennis Rader, MD 03/22/2017, 11:23 AM

## 2017-03-22 NOTE — Anesthesia Postprocedure Evaluation (Signed)
Anesthesia Post Note  Patient: Adam [name]  Procedure(s) Performed: NASAL SEPTOPLASTY WITH  BILATERAL INFERIOR   TURBINATE REDUCTION (Bilateral Nose)     Patient location during evaluation: PACU Anesthesia Type: General Level of consciousness: sedated Pain management: pain level controlled Vital Signs Assessment: post-procedure vital signs reviewed and stable Respiratory status: spontaneous breathing and respiratory function stable Cardiovascular status: stable Postop Assessment: no apparent nausea or vomiting Anesthetic complications: no    Last Vitals:  Vitals:   03/22/17 1359 03/22/17 1400  BP:  (!) 150/85  Pulse: 73 74  Resp: 14 14  Temp: (!) 36.1 C   SpO2: 93% 92%    Last Pain:  Vitals:   03/22/17 0959  TempSrc: Oral                 Adam [name] DANIEL

## 2017-03-22 NOTE — Transfer of Care (Signed)
Immediate Anesthesia Transfer of Care Note  Patient: Adam [name]  Procedure(s) Performed: NASAL SEPTOPLASTY WITH  BILATERAL INFERIOR   TURBINATE REDUCTION (Bilateral Nose)  Patient Location: PACU  Anesthesia Type:General  Level of Consciousness: awake, alert , oriented and patient cooperative  Airway & Oxygen Therapy: Patient Spontanous Breathing and Patient connected to face mask oxygen  Post-op Assessment: Report given to RN, Post -op Vital signs reviewed and stable and Patient moving all extremities X 4  Post vital signs: Reviewed and stable  Last Vitals:  Vitals:   03/22/17 0959 03/22/17 1311  BP: (!) 172/88   Pulse: 74   Resp: 18   Temp: 36.7 C (P) 36.6 C  SpO2: 96%     Last Pain:  Vitals:   03/22/17 0959  TempSrc: Oral         Complications: No apparent anesthesia complications

## 2017-03-22 NOTE — Anesthesia Preprocedure Evaluation (Signed)
Anesthesia Evaluation  Patient identified by MRN, date of birth, ID band Patient awake    Reviewed: Allergy & Precautions, NPO status , Patient's Chart, lab work & pertinent test results  History of Anesthesia Complications Negative for: history of anesthetic complications  Airway Mallampati: III  TM Distance: >3 FB Neck ROM: Full    Dental no notable dental hx. (+) Dental Advisory Given   Pulmonary sleep apnea and Continuous Positive Airway Pressure Ventilation ,    Pulmonary exam normal        Cardiovascular hypertension, Pt. on medications Normal cardiovascular exam     Neuro/Psych negative neurological ROS  negative psych ROS   GI/Hepatic negative GI ROS, Neg liver ROS,   Endo/Other  diabetesMorbid obesity  Renal/GU negative Renal ROS  negative genitourinary   Musculoskeletal negative musculoskeletal ROS (+)   Abdominal   Peds negative pediatric ROS (+)  Hematology negative hematology ROS (+)   Anesthesia Other Findings   Reproductive/Obstetrics negative OB ROS                             Anesthesia Physical Anesthesia Plan  ASA: III  Anesthesia Plan: General   Post-op Pain Management:    Induction: Intravenous and Rapid sequence  PONV Risk Score and Plan: 3 and Ondansetron, Dexamethasone and Scopolamine patch - Pre-op  Airway Management Planned: Oral ETT  Additional Equipment:   Intra-op Plan:   Post-operative Plan: Extubation in OR  Informed Consent: I have reviewed the patients History and Physical, chart, labs and discussed the procedure including the risks, benefits and alternatives for the proposed anesthesia with the patient or authorized representative who has indicated his/her understanding and acceptance.   Dental advisory given  Plan Discussed with: CRNA and Anesthesiologist  Anesthesia Plan Comments:         Anesthesia Quick Evaluation

## 2017-03-23 ENCOUNTER — Encounter (HOSPITAL_COMMUNITY): Payer: Self-pay | Admitting: Otolaryngology

## 2017-04-30 ENCOUNTER — Other Ambulatory Visit: Payer: Self-pay | Admitting: Student in an Organized Health Care Education/Training Program

## 2017-04-30 DIAGNOSIS — I10 Essential (primary) hypertension: Secondary | ICD-10-CM

## 2017-04-30 DIAGNOSIS — M47816 Spondylosis without myelopathy or radiculopathy, lumbar region: Secondary | ICD-10-CM

## 2017-04-30 MED FILL — CYCLOBENZAPRINE 10 MG TAB: 10 | 10 days supply | Qty: 30 | Fill #0

## 2017-04-30 MED FILL — PIOGLITAZONE HCL 45 MG TAB: 45 | 90 days supply | Qty: 90 | Fill #3

## 2017-04-30 MED FILL — AMLODIPINE BESYLATE 5 MG TA: 5 | 90 days supply | Qty: 90 | Fill #1

## 2017-04-30 MED FILL — SIMVASTATIN 20 MG TABLET: 20 | 90 days supply | Qty: 90 | Fill #0

## 2017-05-01 ENCOUNTER — Other Ambulatory Visit: Payer: Self-pay | Admitting: Student in an Organized Health Care Education/Training Program

## 2017-05-01 ENCOUNTER — Other Ambulatory Visit: Payer: Self-pay | Admitting: *Deleted

## 2017-05-01 DIAGNOSIS — Z794 Long term (current) use of insulin: Principal | ICD-10-CM

## 2017-05-01 DIAGNOSIS — E119 Type 2 diabetes mellitus without complications: Secondary | ICD-10-CM

## 2017-05-01 MED ORDER — INSULIN GLARGINE 100 UNIT/ML ~~LOC~~ SOLN
50.0000 [IU] | Freq: Every day | SUBCUTANEOUS | 3 refills | Status: DC
Start: 2017-05-01 — End: 2017-09-10

## 2017-05-01 MED FILL — HYDROCHLOROTHIAZIDE 25 MG T: 25 | 90 days supply | Qty: 90 | Fill #2

## 2017-05-01 MED FILL — DULoxetine HCL 60 MG CPEP: 60 | 90 days supply | Qty: 90 | Fill #0

## 2017-05-01 MED FILL — LANTUS 100 UNITS/ML VIAL: 100 | 80 days supply | Qty: 40 | Fill #0

## 2017-05-01 NOTE — Telephone Encounter (Signed)
Requesting 90 days supply. Has only 1 vial left per pharmacy.

## 2017-05-09 DIAGNOSIS — E119 Type 2 diabetes mellitus without complications: Secondary | ICD-10-CM | POA: Diagnosis not present

## 2017-05-09 DIAGNOSIS — G4730 Sleep apnea, unspecified: Secondary | ICD-10-CM | POA: Diagnosis not present

## 2017-05-09 DIAGNOSIS — I10 Essential (primary) hypertension: Secondary | ICD-10-CM | POA: Diagnosis not present

## 2017-05-09 DIAGNOSIS — Z794 Long term (current) use of insulin: Secondary | ICD-10-CM | POA: Diagnosis not present

## 2017-05-09 DIAGNOSIS — G8929 Other chronic pain: Secondary | ICD-10-CM | POA: Diagnosis not present

## 2017-05-09 DIAGNOSIS — M549 Dorsalgia, unspecified: Secondary | ICD-10-CM | POA: Diagnosis not present

## 2017-05-10 ENCOUNTER — Other Ambulatory Visit: Payer: Self-pay | Admitting: Student in an Organized Health Care Education/Training Program

## 2017-05-10 MED FILL — metFORMIN HCL 1000 MG TABS: 1000 | 90 days supply | Qty: 180 | Fill #0

## 2017-05-10 MED FILL — TADALAFIL 20 MG TABS: 20 | 20 days supply | Qty: 4 | Fill #1

## 2017-05-14 ENCOUNTER — Other Ambulatory Visit (HOSPITAL_COMMUNITY): Payer: Self-pay | Admitting: General Surgery

## 2017-05-21 DIAGNOSIS — E119 Type 2 diabetes mellitus without complications: Secondary | ICD-10-CM | POA: Diagnosis not present

## 2017-05-21 LAB — HM DIABETES EYE EXAM

## 2017-05-27 ENCOUNTER — Encounter: Payer: Self-pay | Admitting: Skilled Nursing Facility1

## 2017-05-27 ENCOUNTER — Encounter: Payer: 59 | Attending: General Surgery | Admitting: Skilled Nursing Facility1

## 2017-05-27 DIAGNOSIS — E119 Type 2 diabetes mellitus without complications: Secondary | ICD-10-CM

## 2017-05-27 DIAGNOSIS — Z713 Dietary counseling and surveillance: Secondary | ICD-10-CM | POA: Diagnosis not present

## 2017-05-27 DIAGNOSIS — Z794 Long term (current) use of insulin: Secondary | ICD-10-CM

## 2017-05-27 NOTE — Progress Notes (Signed)
Pre-Op Assessment Visit:  Pre-Operative [facility] Surgery  Medical Nutrition Therapy:  Appt start time: 7:27  End time:  8:29  Patient was seen on 05/27/2017 for Pre-Operative Nutrition Assessment. Assessment and letter of approval faxed to [facility] Bariatric Surgery Program coordinator on 05/27/2017.  Pt states he was diagnosed with type 2 diabetes about 10 years ago stating his A1C is 6.8 checking about 4-5 times a week checking when he feels "off" headache (280-300) or shaky (85). Pt states he checks his feet once a week. Pt states he needs his correction dose of insulin 2-3 times a month. Pt states his wife has had bypass 10 years ago.    Pt expectation of surgery: for help in surgery; willing to work at my own   Start weight at [facility]: 319.2 BMI: 43.29   24 hr Dietary Recall: First Meal: egg whites or oatmeal  Snack 10: apple or banana  Second Meal 12: rice cup with tuna and vegetables  Snack 3: apple Third Meal: chicken with salad or vegetable or fish or spaghetti  Snack:  Beverages: half and hal tea, water, vodka with half and half, coffee with milk and sugar  Encouraged to engage in 150 minutes of moderate physical activity including cardiovascular and weight baring weekly  Handouts given during visit include:  . Pre-Op Goals . Bariatric Surgery Protein Shakes During the appointment today the following Pre-Op Goals were reviewed with the patient: . Maintain or lose weight as instructed by your surgeon . Make healthy food choices . Begin to limit portion sizes . Limited concentrated sugars and fried foods . Keep fat/sugar in the single digits per serving on             food labels . Practice CHEWING your food  (aim for 30 chews per bite or until applesauce consistency) . Practice not drinking 15 minutes before, during, and 30 minutes after each meal/snack . Avoid all carbonated beverages  . Avoid/limit caffeinated beverages  . Avoid all  sugar-sweetened beverages . Consume 3 meals per day; eat every 3-5 hours . Make a list of non-food related activities . Aim for 64-100 ounces of FLUID daily  . Aim for at least 60-80 grams of PROTEIN daily . Look for a liquid protein source that contain ?15 g protein and ?5 g carbohydrate  (ex: shakes, drinks, shots)  -Follow diet recommendations listed below   Energy and Macronutrient Recomendations: Calories: 1800 Carbohydrate: 200 Protein: 135 Fat: 50  Demonstrated degree of understanding via:  Teach Back  Teaching Method Utilized:  Visual Auditory Hands on  Barriers to learning/adherence to lifestyle change:   Patient to call the Nutrition and Diabetes Education Services to enroll in Pre-Op and Post-Op Nutrition Education when surgery date is scheduled.

## 2017-05-29 ENCOUNTER — Encounter: Payer: Self-pay | Admitting: *Deleted

## 2017-05-31 ENCOUNTER — Ambulatory Visit (HOSPITAL_COMMUNITY)
Admission: RE | Admit: 2017-05-31 | Discharge: 2017-05-31 | Disposition: A | Discharge status: Home / Self Care | Payer: 59 | Source: Ambulatory Visit | Attending: General Surgery | Admitting: General Surgery

## 2017-05-31 ENCOUNTER — Other Ambulatory Visit: Payer: Self-pay

## 2017-05-31 DIAGNOSIS — R079 Chest pain, unspecified: Secondary | ICD-10-CM | POA: Diagnosis not present

## 2017-05-31 DIAGNOSIS — Z01818 Encounter for other preprocedural examination: Secondary | ICD-10-CM | POA: Diagnosis not present

## 2017-05-31 DIAGNOSIS — R05 Cough: Secondary | ICD-10-CM | POA: Diagnosis not present

## 2017-06-06 ENCOUNTER — Other Ambulatory Visit: Payer: Self-pay | Admitting: Student in an Organized Health Care Education/Training Program

## 2017-06-06 MED FILL — LISINOPRIL 40 MG TABLET: 40 | 90 days supply | Qty: 90 | Fill #0

## 2017-06-06 MED FILL — TADALAFIL 20 MG TABS: 20 | 30 days supply | Qty: 6 | Fill #0

## 2017-06-17 ENCOUNTER — Other Ambulatory Visit (HOSPITAL_COMMUNITY)
Admission: RE | Admit: 2017-06-17 | Discharge: 2017-06-17 | Disposition: A | Discharge status: Home / Self Care | Payer: 59 | Source: Ambulatory Visit | Attending: General Surgery | Admitting: General Surgery

## 2017-06-17 LAB — CBC WITH DIFFERENTIAL/PLATELET
BASOS PCT: 0 %
Basophils Absolute: 0 10*3/uL (ref 0.0–0.1)
EOS ABS: 0.2 10*3/uL (ref 0.0–0.7)
EOS PCT: 3 %
HCT: 46.5 % (ref 39.0–52.0)
HEMOGLOBIN: 16.2 g/dL (ref 13.0–17.0)
Lymphocytes Relative: 32 %
Lymphs Abs: 2 10*3/uL (ref 0.7–4.0)
MCH: 32 pg (ref 26.0–34.0)
MCHC: 34.8 g/dL (ref 30.0–36.0)
MCV: 91.7 fL (ref 78.0–100.0)
MONOS PCT: 8 %
Monocytes Absolute: 0.5 10*3/uL (ref 0.1–1.0)
NEUTROS PCT: 57 %
Neutro Abs: 3.7 10*3/uL (ref 1.7–7.7)
PLATELETS: 182 10*3/uL (ref 150–400)
RBC: 5.07 MIL/uL (ref 4.22–5.81)
RDW: 13.2 % (ref 11.5–15.5)
WBC: 6.4 10*3/uL (ref 4.0–10.5)

## 2017-06-17 LAB — LIPID PANEL
CHOLESTEROL: 189 mg/dL (ref 0–200)
HDL: 57 mg/dL (ref >40)
LDL CALC: 89 mg/dL (ref 0–99)
TRIGLYCERIDES: 216 mg/dL — AB (ref <150)
Total CHOL/HDL Ratio: 3.3 RATIO
VLDL: 43 mg/dL — AB (ref 0–40)

## 2017-06-17 LAB — COMPREHENSIVE METABOLIC PANEL
ALBUMIN: 4.1 g/dL (ref 3.5–5.0)
ALK PHOS: 88 U/L (ref 38–126)
ALT: 46 U/L (ref 17–63)
ANION GAP: 14 (ref 5–15)
AST: 52 U/L — ABNORMAL HIGH (ref 15–41)
BUN: 10 mg/dL (ref 6–20)
CALCIUM: 9.8 mg/dL (ref 8.9–10.3)
CHLORIDE: 96 mmol/L — AB (ref 101–111)
CO2: 26 mmol/L (ref 22–32)
Creatinine, Ser: 0.87 mg/dL (ref 0.61–1.24)
GFR calc non Af Amer: >60 mL/min (ref >60)
GLUCOSE: 220 mg/dL — AB (ref 65–99)
Potassium: 4.1 mmol/L (ref 3.5–5.1)
Sodium: 136 mmol/L (ref 135–145)
Total Bilirubin: 0.7 mg/dL (ref 0.3–1.2)
Total Protein: 6.9 g/dL (ref 6.5–8.1)

## 2017-06-17 LAB — IRON: Iron: 137 ug/dL (ref 45–182)

## 2017-06-17 LAB — VITAMIN B12: Vitamin B-12: 274 pg/mL (ref 180–914)

## 2017-06-17 LAB — TSH: TSH: 1.537 u[IU]/mL (ref 0.350–4.500)

## 2017-06-18 LAB — HEMOGLOBIN A1C
HEMOGLOBIN A1C: 7.1 % — AB (ref 4.8–5.6)
MEAN PLASMA GLUCOSE: 157 mg/dL

## 2017-06-18 LAB — VITAMIN D PNL(25-HYDRXY+1,25-DIHY)-BLD
Vit D, 1,25-Dihydroxy: 32.7 pg/mL (ref 19.9–79.3)
Vit D, 25-Hydroxy: 14.7 ng/mL — ABNORMAL LOW (ref 30.0–100.0)

## 2017-06-20 LAB — VITAMIN B1: VITAMIN B1 (THIAMINE): 166.4 nmol/L (ref 66.5–200.0)

## 2017-06-24 ENCOUNTER — Encounter: Payer: 59 | Attending: General Surgery | Admitting: Skilled Nursing Facility1

## 2017-06-24 DIAGNOSIS — Z713 Dietary counseling and surveillance: Secondary | ICD-10-CM | POA: Insufficient documentation

## 2017-06-24 DIAGNOSIS — E119 Type 2 diabetes mellitus without complications: Secondary | ICD-10-CM

## 2017-06-24 DIAGNOSIS — Z794 Long term (current) use of insulin: Secondary | ICD-10-CM

## 2017-06-25 MED FILL — CYCLOBENZAPRINE 10 MG TAB: 10 | 10 days supply | Qty: 30 | Fill #1

## 2017-06-26 ENCOUNTER — Encounter: Payer: Self-pay | Admitting: Skilled Nursing Facility1

## 2017-06-26 ENCOUNTER — Telehealth: Payer: Self-pay | Admitting: Student in an Organized Health Care Education/Training Program

## 2017-06-26 DIAGNOSIS — M47816 Spondylosis without myelopathy or radiculopathy, lumbar region: Secondary | ICD-10-CM

## 2017-06-26 DIAGNOSIS — M67912 Unspecified disorder of synovium and tendon, left shoulder: Secondary | ICD-10-CM

## 2017-06-26 NOTE — Telephone Encounter (Signed)
Patient is requesting a new referral to the [facility] in [facility]  Spoke with their office and the patient will have to have a new Referral. Their policy is if it has been over 3 months since he was seen a new referral is required from his PCP.

## 2017-06-26 NOTE — Progress Notes (Signed)
Pre-Operative Nutrition Class:  Appt start time: 0981   End time:  1830.  Patient was seen on 06/24/2017 for Pre-Operative Bariatric Surgery Education at [facility].   Surgery date:  Surgery type: sleeve Start weight at [facility]: 319.2 Weight today: 326.1  Samples given per MNT protocol. Patient educated on appropriate usage: Procare Multivitamin Lot # [id number] Exp: 10/2018  Bariatric Advantage Calcium  Lot # [id number] Exp: aug-11-2017  Renee Pain Protein  Shake Lot # [id number] Exp: oct-13-19  The following the learning objectives were met by the patient during this course:  Identify Pre-Op Dietary Goals and will begin 2 weeks pre-operatively  Identify appropriate sources of fluids and proteins   State protein recommendations and appropriate sources pre and post-operatively  Identify Post-Operative Dietary Goals and will follow for 2 weeks post-operatively  Identify appropriate multivitamin and calcium sources  Describe the need for physical activity post-operatively and will follow MD recommendations  State when to call healthcare provider regarding medication questions or post-operative complications  Handouts given during class include:  Pre-Op Bariatric Surgery Diet Handout  Protein Shake Handout  Post-Op Bariatric Surgery Nutrition Handout  BELT Program Information Flyer  Support Group Information Flyer  [facility] Bariatric Supplements Price List  Follow-Up Plan: [facility] diet advancement per MD.

## 2017-06-26 NOTE — Telephone Encounter (Signed)
Ok

## 2017-07-01 ENCOUNTER — Encounter: Payer: Self-pay | Admitting: Student in an Organized Health Care Education/Training Program

## 2017-07-01 ENCOUNTER — Ambulatory Visit (INDEPENDENT_AMBULATORY_CARE_PROVIDER_SITE_OTHER): Payer: 59 | Admitting: Student in an Organized Health Care Education/Training Program

## 2017-07-01 VITALS — BP 147/82 | HR 78 | Temp 97.0°F | Wt 322.9 lb

## 2017-07-01 DIAGNOSIS — Z8709 Personal history of other diseases of the respiratory system: Secondary | ICD-10-CM

## 2017-07-01 DIAGNOSIS — E119 Type 2 diabetes mellitus without complications: Secondary | ICD-10-CM | POA: Diagnosis not present

## 2017-07-01 DIAGNOSIS — Z794 Long term (current) use of insulin: Secondary | ICD-10-CM

## 2017-07-01 DIAGNOSIS — Z9889 Other specified postprocedural states: Secondary | ICD-10-CM | POA: Diagnosis not present

## 2017-07-01 DIAGNOSIS — Z79891 Long term (current) use of opiate analgesic: Secondary | ICD-10-CM

## 2017-07-01 DIAGNOSIS — Z6841 Body Mass Index (BMI) 40.0 and over, adult: Secondary | ICD-10-CM

## 2017-07-01 DIAGNOSIS — I10 Essential (primary) hypertension: Secondary | ICD-10-CM | POA: Diagnosis not present

## 2017-07-01 DIAGNOSIS — B078 Other viral warts: Secondary | ICD-10-CM

## 2017-07-01 DIAGNOSIS — B079 Viral wart, unspecified: Secondary | ICD-10-CM | POA: Insufficient documentation

## 2017-07-01 DIAGNOSIS — Z9989 Dependence on other enabling machines and devices: Secondary | ICD-10-CM | POA: Diagnosis not present

## 2017-07-01 DIAGNOSIS — H9193 Unspecified hearing loss, bilateral: Secondary | ICD-10-CM

## 2017-07-01 DIAGNOSIS — G4733 Obstructive sleep apnea (adult) (pediatric): Secondary | ICD-10-CM

## 2017-07-01 DIAGNOSIS — M47816 Spondylosis without myelopathy or radiculopathy, lumbar region: Secondary | ICD-10-CM | POA: Diagnosis not present

## 2017-07-01 DIAGNOSIS — M75102 Unspecified rotator cuff tear or rupture of left shoulder, not specified as traumatic: Secondary | ICD-10-CM | POA: Diagnosis not present

## 2017-07-01 DIAGNOSIS — Z79899 Other long term (current) drug therapy: Secondary | ICD-10-CM

## 2017-07-01 MED ORDER — LOSARTAN POTASSIUM 100 MG PO TABS: 100.0000 mg | ORAL_TABLET | Freq: Every day | ORAL | 3 refills | Status: DC

## 2017-07-01 MED FILL — LOSARTAN POTASSIUM 100 MG T: 100 | 90 days supply | Qty: 90 | Fill #0

## 2017-07-01 NOTE — Assessment & Plan Note (Signed)
Stable hearing loss with normal-appearing ears today.  He followed up with ENT and is planning to have hearing aids placed in the coming months.

## 2017-07-01 NOTE — Assessment & Plan Note (Signed)
One verruca on his right palm.  No other warts.  Mildly irritating and tender to him.  We treated it today with cryotherapy.  Procedure Note: Liquid nitrogen was applied for 10-12 seconds to the skin lesions and the expected blistering or scabbing reaction explained. Do not pick at the areas. Patient reminded to expect hypopigmented scars from the procedure. Return if lesions fail to fully resolve.

## 2017-07-01 NOTE — Assessment & Plan Note (Signed)
Weight today is 322 pounds, BMI of 43.  This is stable.  He still exercising some.  He has established with [facility] surgery and is going through bariatric surgery evaluation.  He says he still has to meet with her social worker and therapist, and anticipates scheduling a laparoscopic gastric sleeve procedure sometime in April.  I think this is an excellent option for him and he is a very good candidate for it.  I will follow-up with him perioperatively to help adjust his blood pressure and diabetes medications.

## 2017-07-01 NOTE — Progress Notes (Signed)
   Assessment and Plan:  See Encounters tab for problem-based medical decision making.   __________________________________________________________  HPI:   47 year old man here for follow-up of diabetes and hypertension.  He came in asking to change from lisinopril to losartan.  He read about several side effects associated with lisinopril, said that 1 of his friends also had a significant cough.  He currently does not have any side effects, does not have any chronic cough, but says that with everything going on in his life right now he wants to reduce the chances of having an adverse side effects from his medications.  He is otherwise doing well.  He has had a very busy year.  He had nasal septoplasty with ENT a few months ago which was very difficult recovery.  He is currently undergoing workup for bariatric surgery with [facility] and thinks that they will schedule a laparoscopic gastric sleeve sometime in April.  He follows with a pain clinic that prescribes him chronic short acting opioids for osteoarthritis of his lumbar spine and tendinopathy of his left rotator cuff, this has been stable.  His wife was also hospitalized a few months ago for symptomatic choledocholithiasis which was complicated by her history of gastric bypass surgery.  He still works, doing mostly office work, but says it is been able to work from home as needed to recover.  He is walking some for exercise, plans on doing more after the bariatric surgery.  __________________________________________________________  Problem List: Patient Active Problem List   Diagnosis Date Noted  . Obesity, Class III, BMI 40-49.9 (morbid obesity) ([place]) 11/12/2016    Priority: High  . Diabetes ([place]) 01/06/2015    Priority: High  . Hearing loss 11/12/2016    Priority: Medium  . Osteoarthritis of lumbar spine 04/09/2016    Priority: Medium  . Tendinopathy of left rotator cuff 12/30/2015    Priority: Medium  . Obstructive  sleep apnea 02/28/2015    Priority: Medium  . Essential hypertension 01/06/2015    Priority: Medium  . Lateral pain of left hip 12/06/2016    Priority: Low  . Erectile dysfunction 01/06/2015    Priority: Low  . Healthcare maintenance 01/06/2015    Priority: Low  . Verruca 07/01/2017    Medications: Reconciled today in Epic __________________________________________________________  Physical Exam:  Vital Signs: Vitals:   07/01/17 0815  BP: (!) 147/82  Pulse: 78  Temp: (!) 97 F (36.1 C)  TempSrc: Oral  SpO2: 97%  Weight: (!) 322 lb 14.4 oz (146.5 kg)    Gen: Well appearing, NAD CV: RRR, no murmurs Pulm: Normal effort, CTA throughout, no wheezing Ext: Warm, no edema Skin: Right hand has a 2 mm wart on the palm at the base of the thenar eminence, mildly keratotic, no vesicles or redness.

## 2017-07-01 NOTE — Assessment & Plan Note (Signed)
Stable on nightly CPAP therapy.  He also had a nasal septoplasty recently with ENT, says that he is breathing much easier through his nose now.

## 2017-07-01 NOTE — Assessment & Plan Note (Signed)
Glucose control is at goal today.  Plan to continue with Lantus 50 units daily, metformin 1000 mg twice daily, and p.o. glitazone 45 mg daily.  He also uses a sliding scale short acting insulin for sugars over 200, but this is rare.  I told him to talk with me around the time of his gastric banding as I anticipate we will need to decrease his long-acting insulin immediately postoperatively.

## 2017-07-01 NOTE — Assessment & Plan Note (Signed)
Blood pressure a little above goal today.  He wants to come off lisinopril because he read about side effects like cough and angioedema.  Currently he is having no side effects, but I think this is fine.  Plan to discontinue lisinopril, start losartan 100 mg daily.  Continue with amlodipine 5 and HCTZ 25 mg daily.  I will follow-up with him after his bariatric surgery anticipates will be able to discontinue at least 1 of these medications after some amount of weight loss.

## 2017-07-01 NOTE — Patient Instructions (Signed)
1. Call me when you know your surgery date and I will help adjust your medications afterwards.

## 2017-07-08 ENCOUNTER — Ambulatory Visit: Payer: 59 | Admitting: Psychology

## 2017-07-08 DIAGNOSIS — F509 Eating disorder, unspecified: Secondary | ICD-10-CM | POA: Diagnosis not present

## 2017-07-19 MED FILL — TADALAFIL 20 MG TABS: 20 | 20 days supply | Qty: 4 | Fill #1

## 2017-07-22 ENCOUNTER — Ambulatory Visit (INDEPENDENT_AMBULATORY_CARE_PROVIDER_SITE_OTHER): Payer: 59 | Admitting: Psychology

## 2017-07-22 ENCOUNTER — Other Ambulatory Visit: Payer: Self-pay | Admitting: *Deleted

## 2017-07-22 DIAGNOSIS — M545 Low back pain: Secondary | ICD-10-CM | POA: Diagnosis not present

## 2017-07-22 DIAGNOSIS — Z79891 Long term (current) use of opiate analgesic: Secondary | ICD-10-CM | POA: Diagnosis not present

## 2017-07-22 DIAGNOSIS — F509 Eating disorder, unspecified: Secondary | ICD-10-CM

## 2017-07-22 DIAGNOSIS — M542 Cervicalgia: Secondary | ICD-10-CM | POA: Diagnosis not present

## 2017-07-22 DIAGNOSIS — G894 Chronic pain syndrome: Secondary | ICD-10-CM | POA: Diagnosis not present

## 2017-07-22 NOTE — Telephone Encounter (Signed)
Note from the pharmacy - "our insurance will pay for 6 every 30 days".

## 2017-07-23 MED ORDER — TADALAFIL 20 MG PO TABS: ORAL_TABLET | ORAL | 0 refills | Status: DC

## 2017-07-30 ENCOUNTER — Other Ambulatory Visit: Payer: Self-pay | Admitting: Student in an Organized Health Care Education/Training Program

## 2017-07-30 DIAGNOSIS — Z794 Long term (current) use of insulin: Secondary | ICD-10-CM

## 2017-07-30 DIAGNOSIS — M47816 Spondylosis without myelopathy or radiculopathy, lumbar region: Secondary | ICD-10-CM

## 2017-07-30 DIAGNOSIS — I10 Essential (primary) hypertension: Secondary | ICD-10-CM

## 2017-07-30 DIAGNOSIS — E119 Type 2 diabetes mellitus without complications: Secondary | ICD-10-CM

## 2017-07-30 MED FILL — SIMVASTATIN 20 MG TABLET: 20 | 90 days supply | Qty: 90 | Fill #1

## 2017-07-30 MED FILL — HYDROCHLOROTHIAZIDE 25 MG T: 25 | 90 days supply | Qty: 90 | Fill #3

## 2017-07-30 MED FILL — DULoxetine HCL 60 MG CPEP: 60 | 90 days supply | Qty: 90 | Fill #1

## 2017-07-31 MED FILL — CYCLOBENZAPRINE 10 MG TAB: 10 | 30 days supply | Qty: 90 | Fill #0

## 2017-07-31 MED FILL — PIOGLITAZONE HCL 45 MG TAB: 45 | 90 days supply | Qty: 90 | Fill #0

## 2017-07-31 MED FILL — AMLODIPINE BESYLATE 5 MG TA: 5 | 90 days supply | Qty: 90 | Fill #0

## 2017-07-31 MED FILL — LANTUS 100 UNITS/ML VIAL: 100 | 80 days supply | Qty: 40 | Fill #1

## 2017-08-07 MED FILL — TADALAFIL 20 MG TABS: 20 | 50 days supply | Qty: 10 | Fill #0

## 2017-08-07 MED FILL — metFORMIN HCL 1000 MG TABS: 1000 | 90 days supply | Qty: 180 | Fill #1

## 2017-08-16 DIAGNOSIS — Z79891 Long term (current) use of opiate analgesic: Secondary | ICD-10-CM | POA: Diagnosis not present

## 2017-08-16 DIAGNOSIS — M545 Low back pain: Secondary | ICD-10-CM | POA: Diagnosis not present

## 2017-08-16 DIAGNOSIS — M542 Cervicalgia: Secondary | ICD-10-CM | POA: Diagnosis not present

## 2017-08-16 DIAGNOSIS — G894 Chronic pain syndrome: Secondary | ICD-10-CM | POA: Diagnosis not present

## 2017-08-19 ENCOUNTER — Other Ambulatory Visit: Payer: Self-pay | Admitting: Student in an Organized Health Care Education/Training Program

## 2017-08-19 NOTE — Telephone Encounter (Signed)
Lap gastric sleeve planned for 5/20. I advised reduce long acting insulin from 50 units to 25 units starting the night before the surgery. Continue BP medications, but he can hold the BP meds if he is not eating or drinking well after the surgery.

## 2017-08-19 NOTE — Telephone Encounter (Signed)
Patient is wanting Dr Evette Doffing to give him a call, patient is having bariatric surgery on May 20, and will needs medicine adjusted.

## 2017-08-21 DIAGNOSIS — M542 Cervicalgia: Secondary | ICD-10-CM | POA: Diagnosis not present

## 2017-08-21 DIAGNOSIS — G894 Chronic pain syndrome: Secondary | ICD-10-CM | POA: Diagnosis not present

## 2017-08-22 MED FILL — PANTOPRAZOLE SOD DR 40 MG T: 40 | 30 days supply | Qty: 30 | Fill #0

## 2017-08-22 MED FILL — ONDANSETRON ODT 4 MG TABLET: 4 | 4 days supply | Qty: 15 | Fill #0

## 2017-09-03 ENCOUNTER — Encounter: Payer: Self-pay | Admitting: Student in an Organized Health Care Education/Training Program

## 2017-09-03 ENCOUNTER — Ambulatory Visit: Payer: Self-pay | Admitting: General Surgery

## 2017-09-03 ENCOUNTER — Encounter (HOSPITAL_COMMUNITY): Payer: Self-pay

## 2017-09-03 NOTE — Progress Notes (Signed)
ekg 05-31-17 epic cxr 05-31-17 epic

## 2017-09-03 NOTE — Patient Instructions (Addendum)
Adam [name]  09/03/2017   Your procedure is scheduled on: 09-09-17  Report to [facility] Main  Entrance               Report to admitting at     0730 AM    Call this number if you have problems the morning of surgery [phone]    Remember: MORNING OF SURGERY DRINK:  [place], DRINK ALL OF THE SHAKE AT ONE TIME.   NO SOLID FOOD AFTER 600 PM THE NIGHT BEFORE YOUR SURGERY. YOU MAY DRINK CLEAR FLUIDS. THE SHAKE YOU DRINK BEFORE YOU LEAVE HOME WILL BE THE LAST FLUIDS YOU DRINK BEFORE SURGERY.  PAIN IS EXPECTED AFTER SURGERY AND WILL NOT BE COMPLETELY ELIMINATED. AMBULATION AND TYLENOL WILL HELP REDUCE INCISIONAL AND GAS PAIN. MOVEMENT IS KEY!  YOU ARE EXPECTED TO BE OUT OF BED WITHIN 4 HOURS OF ADMISSION TO YOUR PATIENT ROOM.  SITTING IN THE RECLINER THROUGHOUT THE DAY IS IMPORTANT FOR DRINKING FLUIDS AND MOVING GAS THROUGHOUT THE GI TRACT.  COMPRESSION STOCKINGS SHOULD BE WORN [place] UNLESS YOU ARE WALKING.   INCENTIVE SPIROMETER SHOULD BE USED EVERY HOUR WHILE AWAKE TO DECREASE POST-OPERATIVE COMPLICATIONS SUCH AS PNEUMONIA.  WHEN DISCHARGED HOME, IT IS IMPORTANT TO CONTINUE TO WALK EVERY HOUR AND USE THE INCENTIVE SPIROMETER EVERY HOUR.      Take these medicines the morning of surgery with A SIP OF WATER: zocor, protonix, cymbalta, baclofen, amlodipine              DO NOT TAKE ANY DIABETIC MEDICATIONS DAY OF YOUR SURGERY                               You may not have any metal on your body including hair pins and              piercings  Do not wear jewelry,  lotions, powders or perfumes, deodorant                      Men may shave face and neck.   Do not bring valuables to the hospital. [place].  Contacts, dentures or bridgework may not be worn into surgery.  Leave suitcase in the car. After surgery it may be brought to your room.     Please read over the following fact sheets you were given: _____________________________________________________________________          [facility] - Preparing for Surgery Before surgery, you can play an important role.  Because skin is not sterile, your skin needs to be as free of germs as possible.  You can reduce the number of germs on your skin by washing with CHG (chlorahexidine gluconate) soap before surgery.  CHG is an antiseptic cleaner which kills germs and bonds with the skin to continue killing germs even after washing. Please DO NOT use if you have an allergy to CHG or antibacterial soaps.  If your skin becomes reddened/irritated stop using the CHG and inform your nurse when you arrive at Short Stay. Do not shave (including legs and underarms) for at least 48 hours prior to the first CHG shower.  You may shave your face/neck. Please follow these instructions  carefully:  1.  Shower with CHG Soap the night before surgery and the  morning of Surgery.  2.  If you choose to wash your hair, wash your hair first as usual with your  normal  shampoo.  3.  After you shampoo, rinse your hair and body thoroughly to remove the  shampoo.                           4.  Use CHG as you would any other liquid soap.  You can apply chg directly  to the skin and wash                       Gently with a scrungie or clean washcloth.  5.  Apply the CHG Soap to your body ONLY FROM THE NECK DOWN.   Do not use on face/ open                           Wound or open sores. Avoid contact with eyes, ears mouth and genitals (private parts).                       Wash face,  Genitals (private parts) with your normal soap.             6.  Wash thoroughly, paying special attention to the area where your surgery  will be performed.  7.  Thoroughly rinse your body with warm water from the neck down.  8.  DO NOT shower/wash with your normal soap after using and rinsing off  the CHG Soap.                9.  Pat yourself dry  with a clean towel.            10.  Wear clean pajamas.            11.  Place clean sheets on your bed the night of your first shower and do not  sleep with pets. Day of Surgery : Do not apply any lotions/deodorants the morning of surgery.  Please wear clean clothes to the hospital/surgery center.  FAILURE TO FOLLOW THESE INSTRUCTIONS MAY RESULT IN THE CANCELLATION OF YOUR SURGERY PATIENT SIGNATURE_________________________________  NURSE SIGNATURE__________________________________  ________________________________________________________________________ How to Manage Your Diabetes Before and After Surgery  Why is it important to control my blood sugar before and after surgery? . Improving blood sugar levels before and after surgery helps healing and can limit problems. . A way of improving blood sugar control is eating a healthy diet by: o  Eating less sugar and carbohydrates o  Increasing activity/exercise o  Talking with your doctor about reaching your blood sugar goals . High blood sugars (greater than 180 mg/dL) can raise your risk of infections and slow your recovery, so you will need to focus on controlling your diabetes during the weeks before surgery. . Make sure that the doctor who takes care of your diabetes knows about your planned surgery including the date and location.  How do I manage my blood sugar before surgery? . Check your blood sugar at least 4 times a day, starting 2 days before surgery, to make sure that the level is not too high or low. o Check your blood sugar the morning of your surgery when you wake up and every 2 hours until you get to the Short  Stay unit. . If your blood sugar is less than 70 mg/dL, you will need to treat for low blood sugar: o Do not take insulin. o Treat a low blood sugar (less than 70 mg/dL) with  cup of clear juice (cranberry or apple), 4 glucose tablets, OR glucose gel. o Recheck blood sugar in 15 minutes after treatment (to make sure  it is greater than 70 mg/dL). If your blood sugar is not greater than 70 mg/dL on recheck, call [phone] for further instructions. . Report your blood sugar to the short stay nurse when you get to Short Stay.  . If you are admitted to the hospital after surgery: o Your blood sugar will be checked by the staff and you will probably be given insulin after surgery (instead of oral diabetes medicines) to make sure you have good blood sugar levels. o The goal for blood sugar control after surgery is 80-180 mg/dL.   WHAT DO I DO ABOUT MY DIABETES MEDICATION?  Marland Kitchen Do not take oral diabetes medicines (pills) the morning of surgery.  . THE NIGHT BEFORE SURGERY, take  50 %  of  lantus     Insulin dose       . THE MORNING OF SURGERY, take  0 units of  lantus     insulin.  . The day of surgery, do not take other diabetes injectables, including Byetta (exenatide), Bydureon (exenatide ER), Victoza (liraglutide), or Trulicity (dulaglutide).  . If your CBG is greater than 220 mg/dL, you may take  of your sliding scale  . (correction) dose of insulin.      Patient Signature:  Date:   Nurse Signature:  Date:   Reviewed and Endorsed by [facility] Patient Education Committee, August 2015   WHAT IS A BLOOD TRANSFUSION? Blood Transfusion Information  A transfusion is the replacement of blood or some of its parts. Blood is made up of multiple cells which provide different functions.  Red blood cells carry oxygen and are used for blood loss replacement.  White blood cells fight against infection.  Platelets control bleeding.  Plasma helps clot blood.  Other blood products are available for specialized needs, such as hemophilia or other clotting disorders. BEFORE THE TRANSFUSION  Who gives blood for transfusions?   Healthy volunteers who are fully evaluated to make sure their blood is safe. This is blood bank blood. Transfusion therapy is the safest it has ever been in the practice of  medicine. Before blood is taken from a donor, a complete history is taken to make sure that person has no history of diseases nor engages in risky social behavior (examples are intravenous drug use or sexual activity with multiple partners). The donor's travel history is screened to minimize risk of transmitting infections, such as malaria. The donated blood is tested for signs of infectious diseases, such as HIV and hepatitis. The blood is then tested to be sure it is compatible with you in order to minimize the chance of a transfusion reaction. If you or a relative donates blood, this is often done in anticipation of surgery and is not appropriate for emergency situations. It takes many days to process the donated blood. RISKS AND COMPLICATIONS Although transfusion therapy is very safe and saves many lives, the main dangers of transfusion include:   Getting an infectious disease.  Developing a transfusion reaction. This is an allergic reaction to something in the blood you were given. Every precaution is taken to prevent this. The decision to have  a blood transfusion has been considered carefully by your caregiver before blood is given. Blood is not given unless the benefits outweigh the risks. AFTER THE TRANSFUSION  Right after receiving a blood transfusion, you will usually feel much better and more energetic. This is especially true if your red blood cells have gotten low (anemic). The transfusion raises the level of the red blood cells which carry oxygen, and this usually causes an energy increase.  The nurse administering the transfusion will monitor you carefully for complications. HOME CARE INSTRUCTIONS  No special instructions are needed after a transfusion. You may find your energy is better. Speak with your caregiver about any limitations on activity for underlying diseases you may have. SEEK MEDICAL CARE IF:   Your condition is not improving after your transfusion.  You develop  redness or irritation at the intravenous (IV) site. SEEK IMMEDIATE MEDICAL CARE IF:  Any of the following symptoms occur over the next 12 hours:  Shaking chills.  You have a temperature by mouth above 102 F (38.9 C), not controlled by medicine.  Chest, back, or muscle pain.  People around you feel you are not acting correctly or are confused.  Shortness of breath or difficulty breathing.  Dizziness and fainting.  You get a rash or develop hives.  You have a decrease in urine output.  Your urine turns a dark color or changes to pink, red, or brown. Any of the following symptoms occur over the next 10 days:  You have a temperature by mouth above 102 F (38.9 C), not controlled by medicine.  Shortness of breath.  Weakness after normal activity.  The white part of the eye turns yellow (jaundice).  You have a decrease in the amount of urine or are urinating less often.  Your urine turns a dark color or changes to pink, red, or brown. Document Released: 04/06/2000 Document Revised: 07/02/2011 Document Reviewed: 11/24/2007 ExitCare Patient Information 2014 [place].  _______________________________________________________________________  Incentive Spirometer  An incentive spirometer is a tool that can help keep your lungs clear and active. This tool measures how well you are filling your lungs with each breath. Taking long deep breaths may help reverse or decrease the chance of developing breathing (pulmonary) problems (especially infection) following:  A long period of time when you are unable to move or be active. BEFORE THE PROCEDURE   If the spirometer includes an indicator to show your best effort, your nurse or respiratory therapist will set it to a desired goal.  If possible, sit up straight or lean slightly forward. Try not to slouch.  Hold the incentive spirometer in an upright position. INSTRUCTIONS FOR USE  1. Sit on the edge of your bed if possible,  or sit up as far as you can in bed or on a chair. 2. Hold the incentive spirometer in an upright position. 3. Breathe out normally. 4. Place the mouthpiece in your mouth and seal your lips tightly around it. 5. Breathe in slowly and as deeply as possible, raising the piston or the ball toward the top of the column. 6. Hold your breath for 3-5 seconds or for as long as possible. Allow the piston or ball to fall to the bottom of the column. 7. Remove the mouthpiece from your mouth and breathe out normally. 8. Rest for a few seconds and repeat Steps 1 through 7 at least 10 times every 1-2 hours when you are awake. Take your time and take a few normal breaths between  deep breaths. 9. The spirometer may include an indicator to show your best effort. Use the indicator as a goal to work toward during each repetition. 10. After each set of 10 deep breaths, practice coughing to be sure your lungs are clear. If you have an incision (the cut made at the time of surgery), support your incision when coughing by placing a pillow or rolled up towels firmly against it. Once you are able to get out of bed, walk around indoors and cough well. You may stop using the incentive spirometer when instructed by your caregiver.  RISKS AND COMPLICATIONS  Take your time so you do not get dizzy or light-headed.  If you are in pain, you may need to take or ask for pain medication before doing incentive spirometry. It is harder to take a deep breath if you are having pain. AFTER USE  Rest and breathe slowly and easily.  It can be helpful to keep track of a log of your progress. Your caregiver can provide you with a simple table to help with this. If you are using the spirometer at home, follow these instructions: [place] IF:   You are having difficultly using the spirometer.  You have trouble using the spirometer as often as instructed.  Your pain medication is not giving enough relief while using the  spirometer.  You develop fever of 100.5 F (38.1 C) or higher. SEEK IMMEDIATE MEDICAL CARE IF:   You cough up bloody sputum that had not been present before.  You develop fever of 102 F (38.9 C) or greater.  You develop worsening pain at or near the incision site. MAKE SURE YOU:   Understand these instructions.  Will watch your condition.  Will get help right away if you are not doing well or get worse. Document Released: 08/20/2006 Document Revised: 07/02/2011 Document Reviewed: 10/21/2006 [facility] Patient Information 2014 [place], [place].   ________________________________________________________________________

## 2017-09-03 NOTE — Progress Notes (Signed)
Please place orders in epic. Pt. Has a preop appt 09-04-17 @ 0800 Thank You!

## 2017-09-04 ENCOUNTER — Encounter (HOSPITAL_COMMUNITY): Payer: Self-pay

## 2017-09-04 ENCOUNTER — Encounter (HOSPITAL_COMMUNITY)
Admission: RE | Admit: 2017-09-04 | Discharge: 2017-09-04 | Disposition: A | Discharge status: Home / Self Care | Payer: 59 | Source: Ambulatory Visit | Attending: General Surgery | Admitting: General Surgery

## 2017-09-04 ENCOUNTER — Other Ambulatory Visit: Payer: Self-pay

## 2017-09-04 DIAGNOSIS — Z01818 Encounter for other preprocedural examination: Secondary | ICD-10-CM | POA: Diagnosis not present

## 2017-09-04 LAB — HEMOGLOBIN A1C
Hgb A1c MFr Bld: 7.4 % — ABNORMAL HIGH (ref 4.8–5.6)
Mean Plasma Glucose: 165.68 mg/dL

## 2017-09-04 LAB — COMPREHENSIVE METABOLIC PANEL
ALBUMIN: 4.4 g/dL (ref 3.5–5.0)
ALK PHOS: 79 U/L (ref 38–126)
ALT: 52 U/L (ref 17–63)
AST: 47 U/L — ABNORMAL HIGH (ref 15–41)
Anion gap: 13 (ref 5–15)
BUN: 19 mg/dL (ref 6–20)
CALCIUM: 9.7 mg/dL (ref 8.9–10.3)
CO2: 28 mmol/L (ref 22–32)
CREATININE: 0.81 mg/dL (ref 0.61–1.24)
Chloride: 100 mmol/L — ABNORMAL LOW (ref 101–111)
GFR calc Af Amer: >60 mL/min (ref >60)
GFR calc non Af Amer: >60 mL/min (ref >60)
GLUCOSE: 130 mg/dL — AB (ref 65–99)
Potassium: 4.4 mmol/L (ref 3.5–5.1)
SODIUM: 141 mmol/L (ref 135–145)
Total Bilirubin: 0.7 mg/dL (ref 0.3–1.2)
Total Protein: 7.4 g/dL (ref 6.5–8.1)

## 2017-09-04 LAB — CBC WITH DIFFERENTIAL/PLATELET
Basophils Absolute: 0 10*3/uL (ref 0.0–0.1)
Basophils Relative: 0 %
EOS ABS: 0.2 10*3/uL (ref 0.0–0.7)
Eosinophils Relative: 2 %
HCT: 45 % (ref 39.0–52.0)
HEMOGLOBIN: 15.4 g/dL (ref 13.0–17.0)
LYMPHS ABS: 2 10*3/uL (ref 0.7–4.0)
Lymphocytes Relative: 25 %
MCH: 32.4 pg (ref 26.0–34.0)
MCHC: 34.2 g/dL (ref 30.0–36.0)
MCV: 94.5 fL (ref 78.0–100.0)
Monocytes Absolute: 0.5 10*3/uL (ref 0.1–1.0)
Monocytes Relative: 6 %
NEUTROS PCT: 67 %
Neutro Abs: 5.1 10*3/uL (ref 1.7–7.7)
Platelets: 173 10*3/uL (ref 150–400)
RBC: 4.76 MIL/uL (ref 4.22–5.81)
RDW: 13.4 % (ref 11.5–15.5)
WBC: 7.8 10*3/uL (ref 4.0–10.5)

## 2017-09-04 LAB — GLUCOSE, CAPILLARY: Glucose-Capillary: 141 mg/dL — ABNORMAL HIGH (ref 65–99)

## 2017-09-04 LAB — ABO/RH: ABO/RH(D): O POS

## 2017-09-06 NOTE — H&P (Signed)
History of Present Illness Marland Kitchen T. Ishi Danser MD; 08/15/2017 11:22 AM) The patient is a 47 year old male who presents with obesity. Patient returns for his preoperative visit prior to planned laparoscopic sleeve gastrectomy. His original presentation was as follows:  Patient is referred by Dr Evette Doffing for consideration for surgical treatment for morbid obesity. The patient gives a history of progressive obesity since early adulthood despite multiple attempts at medical management. Through various diet and exercise programs he has been able to lose a modest amount of weight but then has recurrent weight gain. Obesity has been affecting the patient in a number of ways including increasing difficulty with daily activities and duties at work. Significant co-morbid illnesses have developed including type 2 diabetes mellitus, hypertension and obstructive sleep apnea. He has required a spinal fusion for his back which continues to cause some pain and felt to be at least partially weight related. The patient has been to our initial information seminar, researched surgical options thoroughly, and is interested in sleeve gastrectomy. He has occasional mild reflux treated with over-the-counter medications. No previous abdominal surgery. His wife Lattie Haw is an OR nurse at [employer]. He is originally from [place] and moved here to change jobs.  He has successfully completed his preoperative workup. No concerns on psych or nutrition evaluation. Lab work unremarkable with expected elevated hemoglobin A1c, elevated lipids. We discussed his vitamin D is low and he will discuss this with Dr. Evette Doffing regarding replacement. Upper GI series was unremarkable. No change in his health since his last visit.    Problem List/Past Medical Marland Kitchen T. Kalyb Pemble, MD; 08/15/2017 11:22 AM) TYPE 2 DIABETES MELLITUS TREATED WITH INSULIN (E11.9)  SLEEP APNEA IN ADULT (G47.30)  BENIGN HYPERTENSION (I10)  MORBID OBESITY,  UNSPECIFIED OBESITY TYPE (E66.01)  CHRONIC BACK PAIN GREATER THAN 3 MONTHS DURATION (M54.9)   Past Surgical History Marland Kitchen T. Tasheka Houseman, MD; 08/15/2017 11:22 AM) Oral Surgery  Shoulder Surgery  Left. Spinal Surgery - Lower Back   Diagnostic Studies History Marland Kitchen T. Layman Gully, MD; 08/15/2017 11:22 AM) Colonoscopy  never  Allergies Alean Rinne, RMA; 08/15/2017 11:03 AM) No Known Drug Allergies [05/09/2017]: Allergies Reconciled   Medication History Marland Kitchen T. Francessca Friis, MD; 08/15/2017 11:22 AM) AmLODIPine Besylate (5MG  Tablet, Oral) Active. HydroCHLOROthiazide (25MG  Tablet, Oral) Active. Lantus (100UNIT/ML Solution, Subcutaneous) Active. Simvastatin (20MG  Tablet, Oral) Active. MetFORMIN HCl (1000MG  Tablet, Oral) Active. Pioglitazone HCl (45MG  Tablet, Oral) Active. DULoxetine HCl (60MG  Capsule DR Part, Oral) Active. Cyclobenzaprine HCl (10MG  Tablet, Oral) Active. Hydrocodone-Acetaminophen (7.5-325MG  Tablet, Oral) Active. Tadalafil (20MG  Tablet, Oral) Active. Losartan Potassium (100MG  Tablet, Oral) Active. Lisinopril (40MG  Tablet, Oral) Active. Medications Reconciled OxyCODONE HCl (5MG /5ML Solution, 5-10 Oral every four hours, as needed, Taken starting 08/15/2017) Active. Protonix (40MG  Tablet DR, 1 (one) Oral daily, Taken starting 08/15/2017) Active. Ondansetron (4MG  Tablet Disint, 1 (one) Oral every six hours, as needed, Taken starting 08/15/2017) Active.  Social History Marland Kitchen T. Fuller Makin, MD; 08/15/2017 11:22 AM) Alcohol use  Moderate alcohol use. Caffeine use  Coffee.  Family History Marland Kitchen T. Summer Mccolgan, MD; 08/15/2017 11:22 AM) Alcohol Abuse  Mother. Arthritis  Father, Mother. Cerebrovascular Accident  Mother. Diabetes Mellitus  Father. Hypertension  Father, Mother.  Other Problems Marland Kitchen T. Yashas Camilli, MD; 08/15/2017 11:22 AM) Anxiety Disorder  Arthritis  Back Pain  Diabetes Mellitus  High blood pressure  Sleep Apnea    Vitals Alean Rinne RMA; 08/15/2017 11:03 AM) 08/15/2017 11:02 AM Weight: 323 lb Height: 72in Body Surface Area: 2.61 m Body Mass Index: 43.81 kg/m  Temp.: 98.39F  Pulse: 97 (Regular)  BP: 135/80 (Sitting, Left Arm, Standard)       Physical Exam Marland Kitchen T. Imre Vecchione MD; 08/15/2017 11:23 AM) The physical exam findings are as follows: Note:General: Alert, morbidly obese Caucasian male, in no distress Skin: Warm and dry without rash or infection. HEENT: No palpable masses or thyromegaly. Sclera nonicteric. Pupils equal round and reactive. Lymph nodes: No cervical, supraclavicular, or inguinal nodes palpable. Lungs: Breath sounds clear and equal. No wheezing or increased work of breathing. Cardiovascular: Regular rate and rhythm without murmer. No JVD or edema. Peripheral pulses intact. Abdomen: Nondistended. Soft and nontender. No masses palpable. No organomegaly. No palpable hernias. Extremities: No edema or joint swelling or deformity. No chronic venous stasis changes. Neurologic: Alert and fully oriented. Gait normal. No focal weakness. Psychiatric: Normal mood and affect. Thought content appropriate with normal judgement and insight    Assessment & Plan Marland Kitchen T. Javon Hupfer MD; 08/15/2017 11:28 AM) MORBID OBESITY, UNSPECIFIED OBESITY TYPE (E66.01) Current Plans Started OxyCODONE HCl 5 MG/5ML Oral Solution, 5-10 Milliliter every four hours, as needed, 120 Milliliter, 08/15/2017, No Refill. Started Protonix 40 MG Oral Tablet Delayed Release, 1 (one) Tablet daily, #30, 08/15/2017, Ref. x2. Started Ondansetron 4 MG Oral Tablet Disintegrating, 1 (one) Tablet every six hours, as needed, #15, 08/15/2017, No Refill. OBESITY, MORBID, BMI 40.0-49.9 (E66.01) Impression: Patient with progressive morbid obesity unresponsive to multiple efforts at medical management who presents with a BMI of 43 and comorbidities of adult-onset diabetes mellitus, obstructive sleep apnea,  hypertension and chronic back and joint pain. I believe there would be very significant medical benefit from surgical weight loss. After our discussion of surgical options currently available the patient has decided to proceed with laparoscopic sleeve gastrectomy due to the reasons above. We have discussed the nature of the problem and the risks of remaining morbidly obese. We discussed laparoscopic sleeve gastrectomy in detail including the nature of the procedure, expected hospitalization and recovery, and major risks of anesthetic complications, bleeding, blood clots and pulmonary emboli, leakage and infection and long-term risks of stricture, , bowel obstruction, reflux, nutritional deficiencies, and failure to lose weight or weight regain. We discussed these problems could lead to death. We discussed that the procedure is not reversible. We discussed possible need for conversion to gastric bypass or other procedure. The patient was given a complete consent form to review and all questions were answered. He is given prescriptions for pain and nausea medication and Protonix. Ready to proceed with laparoscopic sleeve gastrectomy. Current Plans Schedule for Surgery  Laparoscopic sleeve gastrectomy

## 2017-09-09 ENCOUNTER — Inpatient Hospital Stay (HOSPITAL_COMMUNITY): Payer: 59 | Admitting: Certified Registered Nurse Anesthetist

## 2017-09-09 ENCOUNTER — Inpatient Hospital Stay (HOSPITAL_COMMUNITY)
Admission: RE | Admit: 2017-09-09 | Discharge: 2017-09-10 | DRG: 621 | Disposition: A | Discharge status: Home / Self Care | Payer: 59 | Source: Ambulatory Visit | Attending: General Surgery | Admitting: General Surgery

## 2017-09-09 ENCOUNTER — Other Ambulatory Visit: Payer: Self-pay

## 2017-09-09 ENCOUNTER — Encounter (HOSPITAL_COMMUNITY): Payer: Self-pay | Admitting: Emergency Medicine

## 2017-09-09 ENCOUNTER — Encounter (HOSPITAL_COMMUNITY)
Admission: RE | Disposition: A | Discharge status: Home / Self Care | Payer: Self-pay | Source: Ambulatory Visit | Attending: General Surgery

## 2017-09-09 DIAGNOSIS — M549 Dorsalgia, unspecified: Secondary | ICD-10-CM | POA: Diagnosis present

## 2017-09-09 DIAGNOSIS — F419 Anxiety disorder, unspecified: Secondary | ICD-10-CM | POA: Diagnosis present

## 2017-09-09 DIAGNOSIS — G4733 Obstructive sleep apnea (adult) (pediatric): Secondary | ICD-10-CM | POA: Diagnosis present

## 2017-09-09 DIAGNOSIS — G8929 Other chronic pain: Secondary | ICD-10-CM | POA: Diagnosis present

## 2017-09-09 DIAGNOSIS — E119 Type 2 diabetes mellitus without complications: Secondary | ICD-10-CM | POA: Diagnosis present

## 2017-09-09 DIAGNOSIS — K219 Gastro-esophageal reflux disease without esophagitis: Secondary | ICD-10-CM | POA: Diagnosis present

## 2017-09-09 DIAGNOSIS — Z6841 Body Mass Index (BMI) 40.0 and over, adult: Secondary | ICD-10-CM

## 2017-09-09 DIAGNOSIS — Z79899 Other long term (current) drug therapy: Secondary | ICD-10-CM | POA: Diagnosis not present

## 2017-09-09 DIAGNOSIS — I10 Essential (primary) hypertension: Secondary | ICD-10-CM | POA: Diagnosis present

## 2017-09-09 DIAGNOSIS — M1990 Unspecified osteoarthritis, unspecified site: Secondary | ICD-10-CM | POA: Diagnosis present

## 2017-09-09 DIAGNOSIS — Z7984 Long term (current) use of oral hypoglycemic drugs: Secondary | ICD-10-CM | POA: Diagnosis not present

## 2017-09-09 DIAGNOSIS — Z794 Long term (current) use of insulin: Secondary | ICD-10-CM

## 2017-09-09 DIAGNOSIS — Z981 Arthrodesis status: Secondary | ICD-10-CM | POA: Diagnosis not present

## 2017-09-09 HISTORY — PX: LAPAROSCOPIC GASTRIC SLEEVE RESECTION: SHX5895

## 2017-09-09 LAB — HEMOGLOBIN AND HEMATOCRIT, BLOOD
HCT: 42.6 % (ref 39.0–52.0)
HEMOGLOBIN: 14.3 g/dL (ref 13.0–17.0)

## 2017-09-09 LAB — GLUCOSE, CAPILLARY
GLUCOSE-CAPILLARY: 172 mg/dL — AB (ref 65–99)
Glucose-Capillary: 164 mg/dL — ABNORMAL HIGH (ref 65–99)
Glucose-Capillary: 185 mg/dL — ABNORMAL HIGH (ref 65–99)
Glucose-Capillary: 197 mg/dL — ABNORMAL HIGH (ref 65–99)

## 2017-09-09 LAB — TYPE AND SCREEN
ABO/RH(D): O POS
Antibody Screen: NEGATIVE

## 2017-09-09 SURGERY — GASTRECTOMY, SLEEVE, LAPAROSCOPIC
Anesthesia: General | Site: Abdomen

## 2017-09-09 MED ORDER — BUPIVACAINE-EPINEPHRINE (PF) 0.5% -1:200000 IJ SOLN
INTRAMUSCULAR | Status: DC | PRN
  Administered 2017-09-09: 30 mL

## 2017-09-09 MED ORDER — FENTANYL CITRATE (PF) 100 MCG/2ML IJ SOLN
INTRAMUSCULAR | Status: AC
  Filled 2017-09-09: qty 2

## 2017-09-09 MED ORDER — PREMIER PROTEIN SHAKE
2.0000 [oz_av] | ORAL | Status: DC
  Administered 2017-09-10 (×2): 2 [oz_av] via ORAL

## 2017-09-09 MED ORDER — OXYCODONE HCL 5 MG/5ML PO SOLN
5.0000 mg | ORAL | Status: DC | PRN
  Administered 2017-09-09 – 2017-09-10 (×3): 10 mg via ORAL
  Filled 2017-09-09 (×3): qty 10

## 2017-09-09 MED ORDER — ONDANSETRON HCL 4 MG/2ML IJ SOLN: 4.0000 mg | INTRAMUSCULAR | Status: DC | PRN

## 2017-09-09 MED ORDER — BUPIVACAINE HCL (PF) 0.25 % IJ SOLN
INTRAMUSCULAR | Status: AC
  Filled 2017-09-09: qty 30

## 2017-09-09 MED ORDER — CELECOXIB 200 MG PO CAPS
400.0000 mg | ORAL_CAPSULE | ORAL | Status: AC
  Administered 2017-09-09: 400 mg via ORAL
  Filled 2017-09-09: qty 2

## 2017-09-09 MED ORDER — LIDOCAINE 2% (20 MG/ML) 5 ML SYRINGE
INTRAMUSCULAR | Status: DC | PRN
  Administered 2017-09-09: 1.5 mg/kg/h via INTRAVENOUS

## 2017-09-09 MED ORDER — APREPITANT 40 MG PO CAPS
40.0000 mg | ORAL_CAPSULE | ORAL | Status: AC
  Administered 2017-09-09: 40 mg via ORAL
  Filled 2017-09-09: qty 1

## 2017-09-09 MED ORDER — LIDOCAINE 2% (20 MG/ML) 5 ML SYRINGE
INTRAMUSCULAR | Status: DC | PRN
  Administered 2017-09-09: 100 mg via INTRAVENOUS

## 2017-09-09 MED ORDER — LACTATED RINGERS IV SOLN
INTRAVENOUS | Status: DC
  Administered 2017-09-09 (×2): via INTRAVENOUS

## 2017-09-09 MED ORDER — ROCURONIUM BROMIDE 10 MG/ML (PF) SYRINGE
PREFILLED_SYRINGE | INTRAVENOUS | Status: AC
  Filled 2017-09-09: qty 5

## 2017-09-09 MED ORDER — CHLORHEXIDINE GLUCONATE CLOTH 2 % EX PADS: 6.0000 | MEDICATED_PAD | Freq: Once | CUTANEOUS | Status: DC

## 2017-09-09 MED ORDER — EVICEL 5 ML EX KIT
PACK | Freq: Once | CUTANEOUS | Status: AC
  Administered 2017-09-09: 1
  Filled 2017-09-09: qty 1

## 2017-09-09 MED ORDER — EPHEDRINE SULFATE-NACL 50-0.9 MG/10ML-% IV SOSY
PREFILLED_SYRINGE | INTRAVENOUS | Status: DC | PRN
  Administered 2017-09-09 (×2): 5 mg via INTRAVENOUS
  Administered 2017-09-09: 10 mg via INTRAVENOUS

## 2017-09-09 MED ORDER — SUGAMMADEX SODIUM 500 MG/5ML IV SOLN
INTRAVENOUS | Status: DC | PRN
  Administered 2017-09-09: 300 mg via INTRAVENOUS

## 2017-09-09 MED ORDER — SUGAMMADEX SODIUM 500 MG/5ML IV SOLN
INTRAVENOUS | Status: AC
  Filled 2017-09-09: qty 5

## 2017-09-09 MED ORDER — ROCURONIUM BROMIDE 50 MG/5ML IV SOSY
PREFILLED_SYRINGE | INTRAVENOUS | Status: DC | PRN
  Administered 2017-09-09: 20 mg via INTRAVENOUS
  Administered 2017-09-09: 10 mg via INTRAVENOUS
  Administered 2017-09-09: 20 mg via INTRAVENOUS
  Administered 2017-09-09: 50 mg via INTRAVENOUS

## 2017-09-09 MED ORDER — METOPROLOL TARTRATE 5 MG/5ML IV SOLN: 5.0000 mg | Freq: Four times a day (QID) | INTRAVENOUS | Status: DC | PRN

## 2017-09-09 MED ORDER — POTASSIUM CHLORIDE IN NACL 20-0.9 MEQ/L-% IV SOLN
INTRAVENOUS | Status: DC
  Administered 2017-09-09 – 2017-09-10 (×2): via INTRAVENOUS
  Filled 2017-09-09: qty 1000

## 2017-09-09 MED ORDER — FENTANYL CITRATE (PF) 100 MCG/2ML IJ SOLN
25.0000 ug | INTRAMUSCULAR | Status: DC | PRN
  Administered 2017-09-09 (×3): 50 ug via INTRAVENOUS

## 2017-09-09 MED ORDER — PROMETHAZINE HCL 25 MG/ML IJ SOLN: 6.2500 mg | INTRAMUSCULAR | Status: DC | PRN

## 2017-09-09 MED ORDER — LIDOCAINE 2% (20 MG/ML) 5 ML SYRINGE
INTRAMUSCULAR | Status: AC
  Filled 2017-09-09: qty 5

## 2017-09-09 MED ORDER — PROPOFOL 10 MG/ML IV BOLUS
INTRAVENOUS | Status: AC
  Filled 2017-09-09: qty 40

## 2017-09-09 MED ORDER — PROPOFOL 10 MG/ML IV BOLUS
INTRAVENOUS | Status: DC | PRN
  Administered 2017-09-09: 200 mg via INTRAVENOUS
  Administered 2017-09-09: 50 mg via INTRAVENOUS

## 2017-09-09 MED ORDER — MORPHINE SULFATE (PF) 2 MG/ML IV SOLN: 1.0000 mg | INTRAVENOUS | Status: DC | PRN

## 2017-09-09 MED ORDER — LIDOCAINE HCL 2 % IJ SOLN
INTRAMUSCULAR | Status: AC
  Filled 2017-09-09: qty 20

## 2017-09-09 MED ORDER — SODIUM CHLORIDE 0.9 % IJ SOLN
INTRAMUSCULAR | Status: DC | PRN
  Administered 2017-09-09: 50 mL

## 2017-09-09 MED ORDER — SCOPOLAMINE 1 MG/3DAYS TD PT72
1.0000 | MEDICATED_PATCH | TRANSDERMAL | Status: DC
  Administered 2017-09-09: 1.5 mg via TRANSDERMAL
  Filled 2017-09-09: qty 1

## 2017-09-09 MED ORDER — ONDANSETRON HCL 4 MG/2ML IJ SOLN
INTRAMUSCULAR | Status: AC
  Filled 2017-09-09: qty 2

## 2017-09-09 MED ORDER — MIDAZOLAM HCL 2 MG/2ML IJ SOLN
INTRAMUSCULAR | Status: AC
  Filled 2017-09-09: qty 2

## 2017-09-09 MED ORDER — BUPIVACAINE-EPINEPHRINE (PF) 0.5% -1:200000 IJ SOLN
INTRAMUSCULAR | Status: AC
  Filled 2017-09-09: qty 30

## 2017-09-09 MED ORDER — FENTANYL CITRATE (PF) 100 MCG/2ML IJ SOLN
INTRAMUSCULAR | Status: DC | PRN
  Administered 2017-09-09: 100 ug via INTRAVENOUS
  Administered 2017-09-09: 50 ug via INTRAVENOUS
  Administered 2017-09-09: 100 ug via INTRAVENOUS

## 2017-09-09 MED ORDER — LOSARTAN POTASSIUM 50 MG PO TABS
100.0000 mg | ORAL_TABLET | Freq: Every day | ORAL | Status: DC
  Administered 2017-09-09 – 2017-09-10 (×2): 100 mg via ORAL
  Filled 2017-09-09 (×2): qty 2

## 2017-09-09 MED ORDER — BACLOFEN 10 MG PO TABS
10.0000 mg | ORAL_TABLET | Freq: Every day | ORAL | Status: DC
  Administered 2017-09-09 – 2017-09-10 (×2): 10 mg via ORAL
  Filled 2017-09-09 (×2): qty 1

## 2017-09-09 MED ORDER — SUCCINYLCHOLINE CHLORIDE 200 MG/10ML IV SOSY
PREFILLED_SYRINGE | INTRAVENOUS | Status: AC
  Filled 2017-09-09: qty 10

## 2017-09-09 MED ORDER — ACETAMINOPHEN 160 MG/5ML PO SOLN
650.0000 mg | Freq: Four times a day (QID) | ORAL | Status: DC
  Administered 2017-09-09 – 2017-09-10 (×3): 650 mg via ORAL
  Filled 2017-09-09 (×4): qty 20.3

## 2017-09-09 MED ORDER — CEFOTETAN DISODIUM-DEXTROSE 2-2.08 GM-%(50ML) IV SOLR
2.0000 g | INTRAVENOUS | Status: AC
  Administered 2017-09-09: 2 g via INTRAVENOUS
  Filled 2017-09-09: qty 50

## 2017-09-09 MED ORDER — ONDANSETRON HCL 4 MG/2ML IJ SOLN
INTRAMUSCULAR | Status: DC | PRN
  Administered 2017-09-09: 4 mg via INTRAVENOUS

## 2017-09-09 MED ORDER — ENOXAPARIN SODIUM 30 MG/0.3ML ~~LOC~~ SOLN
30.0000 mg | Freq: Two times a day (BID) | SUBCUTANEOUS | Status: DC
  Administered 2017-09-09 – 2017-09-10 (×2): 30 mg via SUBCUTANEOUS
  Filled 2017-09-09 (×2): qty 0.3

## 2017-09-09 MED ORDER — DULOXETINE HCL 60 MG PO CPEP
60.0000 mg | ORAL_CAPSULE | Freq: Every day | ORAL | Status: DC
  Administered 2017-09-09 – 2017-09-10 (×2): 60 mg via ORAL
  Filled 2017-09-09: qty 2
  Filled 2017-09-09: qty 1

## 2017-09-09 MED ORDER — INSULIN GLARGINE 100 UNIT/ML ~~LOC~~ SOLN
15.0000 [IU] | Freq: Every day | SUBCUTANEOUS | Status: DC
  Administered 2017-09-09: 15 [IU] via SUBCUTANEOUS
  Filled 2017-09-09: qty 0.15

## 2017-09-09 MED ORDER — MIDAZOLAM HCL 5 MG/5ML IJ SOLN
INTRAMUSCULAR | Status: DC | PRN
  Administered 2017-09-09 (×2): 1 mg via INTRAVENOUS

## 2017-09-09 MED ORDER — FAMOTIDINE IN NACL 20-0.9 MG/50ML-% IV SOLN
20.0000 mg | Freq: Two times a day (BID) | INTRAVENOUS | Status: DC
  Administered 2017-09-09 – 2017-09-10 (×3): 20 mg via INTRAVENOUS
  Filled 2017-09-09 (×3): qty 50

## 2017-09-09 MED ORDER — DEXAMETHASONE SODIUM PHOSPHATE 4 MG/ML IJ SOLN
4.0000 mg | INTRAMUSCULAR | Status: AC
  Administered 2017-09-09: 4 mg via INTRAVENOUS

## 2017-09-09 MED ORDER — AMLODIPINE BESYLATE 5 MG PO TABS
5.0000 mg | ORAL_TABLET | Freq: Every day | ORAL | Status: DC
  Administered 2017-09-09 – 2017-09-10 (×2): 5 mg via ORAL
  Filled 2017-09-09 (×2): qty 1

## 2017-09-09 MED ORDER — INSULIN ASPART 100 UNIT/ML ~~LOC~~ SOLN
0.0000 [IU] | SUBCUTANEOUS | Status: DC
  Administered 2017-09-09 – 2017-09-10 (×3): 3 [IU] via SUBCUTANEOUS
  Administered 2017-09-10: 2 [IU] via SUBCUTANEOUS
  Administered 2017-09-10: 3 [IU] via SUBCUTANEOUS

## 2017-09-09 MED ORDER — GABAPENTIN 300 MG PO CAPS
300.0000 mg | ORAL_CAPSULE | ORAL | Status: AC
  Administered 2017-09-09: 300 mg via ORAL
  Filled 2017-09-09: qty 1

## 2017-09-09 MED ORDER — HEPARIN SODIUM (PORCINE) 5000 UNIT/ML IJ SOLN
5000.0000 [IU] | INTRAMUSCULAR | Status: AC
  Administered 2017-09-09: 5000 [IU] via SUBCUTANEOUS
  Filled 2017-09-09: qty 1

## 2017-09-09 MED ORDER — LABETALOL HCL 5 MG/ML IV SOLN
INTRAVENOUS | Status: AC
  Administered 2017-09-09: 10 mg via INTRAVENOUS
  Filled 2017-09-09: qty 4

## 2017-09-09 MED ORDER — LACTATED RINGERS IR SOLN
Status: DC | PRN
  Administered 2017-09-09: 1000 mL

## 2017-09-09 MED ORDER — KETAMINE HCL 10 MG/ML IJ SOLN
INTRAMUSCULAR | Status: DC | PRN
  Administered 2017-09-09: 50 mg via INTRAVENOUS

## 2017-09-09 MED ORDER — BUPIVACAINE LIPOSOME 1.3 % IJ SUSP
20.0000 mL | Freq: Once | INTRAMUSCULAR | Status: AC
  Administered 2017-09-09: 20 mL
  Filled 2017-09-09: qty 20

## 2017-09-09 MED ORDER — FENTANYL CITRATE (PF) 250 MCG/5ML IJ SOLN
INTRAMUSCULAR | Status: AC
  Filled 2017-09-09: qty 5

## 2017-09-09 MED ORDER — LABETALOL HCL 5 MG/ML IV SOLN
10.0000 mg | Freq: Once | INTRAVENOUS | Status: AC
  Administered 2017-09-09: 10 mg via INTRAVENOUS

## 2017-09-09 MED ORDER — DEXAMETHASONE SODIUM PHOSPHATE 10 MG/ML IJ SOLN: INTRAMUSCULAR | Status: DC | PRN

## 2017-09-09 MED ORDER — SODIUM CHLORIDE 0.9 % IJ SOLN
INTRAMUSCULAR | Status: AC
  Filled 2017-09-09: qty 50

## 2017-09-09 MED ORDER — EPHEDRINE SULFATE 50 MG/ML IJ SOLN
INTRAMUSCULAR | Status: AC
  Filled 2017-09-09: qty 1

## 2017-09-09 SURGICAL SUPPLY — 58 items
APPLIER CLIP ROT 10 11.4 M/L (STAPLE)
APPLIER CLIP ROT 13.4 12 LRG (CLIP)
BLADE SURG SZ11 CARB STEEL (BLADE) ×3 IMPLANT
CABLE HIGH FREQUENCY MONO STRZ (ELECTRODE) ×3 IMPLANT
CHLORAPREP W/TINT 26ML (MISCELLANEOUS) ×3 IMPLANT
CLIP APPLIE ROT 10 11.4 M/L (STAPLE) IMPLANT
CLIP APPLIE ROT 13.4 12 LRG (CLIP) IMPLANT
DERMABOND ADVANCED (GAUZE/BANDAGES/DRESSINGS) ×2
DERMABOND ADVANCED .7 DNX12 (GAUZE/BANDAGES/DRESSINGS) ×1 IMPLANT
DEVICE SUT QUICK LOAD TK 5 (STAPLE) IMPLANT
DEVICE SUT TI-KNOT TK 5X26 (MISCELLANEOUS) IMPLANT
DEVICE SUTURE ENDOST 10MM (ENDOMECHANICALS) IMPLANT
DEVICE TI KNOT TK5 (MISCELLANEOUS)
DRAPE UTILITY XL STRL (DRAPES) ×6 IMPLANT
ELECT REM PT RETURN 15FT ADLT (MISCELLANEOUS) ×3 IMPLANT
GAUZE SPONGE 4X4 12PLY STRL (GAUZE/BANDAGES/DRESSINGS) IMPLANT
GLOVE BIOGEL PI IND STRL 7.5 (GLOVE) ×1 IMPLANT
GLOVE BIOGEL PI INDICATOR 7.5 (GLOVE) ×2
GLOVE ECLIPSE 7.5 STRL STRAW (GLOVE) ×3 IMPLANT
GOWN STRL REUS W/TWL XL LVL3 (GOWN DISPOSABLE) ×12 IMPLANT
GRASPER SUT TROCAR 14GX15 (MISCELLANEOUS) IMPLANT
HOVERMATT SINGLE USE (MISCELLANEOUS) ×3 IMPLANT
KIT BASIN OR (CUSTOM PROCEDURE TRAY) ×3 IMPLANT
MARKER SKIN DUAL TIP RULER LAB (MISCELLANEOUS) ×3 IMPLANT
NEEDLE SPNL 22GX3.5 QUINCKE BK (NEEDLE) ×3 IMPLANT
PACK UNIVERSAL I (CUSTOM PROCEDURE TRAY) ×3 IMPLANT
QUICK LOAD TK 5 (STAPLE)
RELOAD STAPLER BLUE 60MM (STAPLE) ×2 IMPLANT
RELOAD STAPLER GOLD 60MM (STAPLE) ×2 IMPLANT
RELOAD STAPLER GREEN 60MM (STAPLE) ×1 IMPLANT
SCISSORS LAP 5X45 EPIX DISP (ENDOMECHANICALS) ×3 IMPLANT
SET IRRIG TUBING LAPAROSCOPIC (IRRIGATION / IRRIGATOR) ×3 IMPLANT
SHEARS HARMONIC ACE PLUS 45CM (MISCELLANEOUS) ×3 IMPLANT
SLEEVE ADV FIXATION 5X100MM (TROCAR) ×3 IMPLANT
SLEEVE GASTRECTOMY 36FR VISIGI (MISCELLANEOUS) ×3 IMPLANT
SOLUTION ANTI FOG 6CC (MISCELLANEOUS) ×3 IMPLANT
SPONGE LAP 18X18 RF (DISPOSABLE) ×3 IMPLANT
STAPLER ECHELON LONG 60 440 (INSTRUMENTS) ×3 IMPLANT
STAPLER RELOAD BLUE 60MM (STAPLE) ×6
STAPLER RELOAD GOLD 60MM (STAPLE) ×6
STAPLER RELOAD GREEN 60MM (STAPLE) ×3
SUT MNCRL AB 4-0 PS2 18 (SUTURE) ×3 IMPLANT
SUT SURGIDAC NAB ES-9 0 48 120 (SUTURE) ×3 IMPLANT
SUT VIC AB 0 BRD 54 (SUTURE) IMPLANT
SYR 10ML ECCENTRIC (SYRINGE) ×3 IMPLANT
SYR 20CC LL (SYRINGE) ×6 IMPLANT
SYSTEM WECK SHIELD CLOSURE (TROCAR) IMPLANT
TIP RIGID 35CM EVICEL (HEMOSTASIS) ×3 IMPLANT
TOWEL OR 17X26 10 PK STRL BLUE (TOWEL DISPOSABLE) ×3 IMPLANT
TOWEL OR NON WOVEN STRL DISP B (DISPOSABLE) ×3 IMPLANT
TROCAR ADV FIXATION 5X100MM (TROCAR) ×3 IMPLANT
TROCAR BLADELESS 15MM (ENDOMECHANICALS) ×3 IMPLANT
TROCAR BLADELESS OPT 5 100 (ENDOMECHANICALS) ×3 IMPLANT
TROCAR XCEL 12X100 BLDLESS (ENDOMECHANICALS) ×3 IMPLANT
TUBING CONNECTING 10 (TUBING) ×2 IMPLANT
TUBING CONNECTING 10' (TUBING) ×1
TUBING ENDO SMARTCAP PENTAX (MISCELLANEOUS) ×3 IMPLANT
TUBING INSUF HEATED (TUBING) ×3 IMPLANT

## 2017-09-09 NOTE — Anesthesia Procedure Notes (Addendum)
Procedure Name: Intubation Date/Time: 09/09/2017 9:46 AM Performed by: Montel Clock, CRNA Pre-anesthesia Checklist: Patient identified, Emergency Drugs available, Suction available, Patient being monitored and Timeout performed Patient Re-evaluated:Patient Re-evaluated prior to induction Oxygen Delivery Method: Circle system utilized Preoxygenation: Pre-oxygenation with 100% oxygen Induction Type: IV induction Ventilation: Mask ventilation without difficulty, Two handed mask ventilation required and Oral airway inserted - appropriate to patient size Laryngoscope Size: Mac, 3, Glidescope and 4 Grade View: Grade II Tube type: Oral Tube size: 7.5 mm Number of attempts: 2 Airway Equipment and Method: Stylet Placement Confirmation: ETT inserted through vocal cords under direct vision,  positive ETCO2 and breath sounds checked- equal and bilateral Secured at: 23 cm Tube secured with: Tape Dental Injury: Teeth and Oropharynx as per pre-operative assessment  Difficulty Due To: Difficult Airway- due to anterior larynx and Difficult Airway- due to large tongue Future Recommendations: Recommend- induction with short-acting agent, and alternative techniques readily available Comments: SRNA intubation. First attempt MAC 4 with grade 3 view. Second attempt Glidescope 4 blade, some manipulation require to advance ETT through cords (anterior). 2-handed mask with oral airway 10

## 2017-09-09 NOTE — Op Note (Signed)
Preoperative Diagnosis: MORBID OBESITY  Postoprative Diagnosis: MORBID OBESITY  Procedure: Procedure(s): LAPAROSCOPIC GASTRIC SLEEVE RESECTION  AND ERAS PATHWAY   Surgeon: Excell Seltzer T   Assistants: Johnathan Hausen  Anesthesia:  General endotracheal anesthesia  Indications: Patient with progressive morbid obesity unresponsive to multiple efforts at medical management who presents with a BMI of 43 and comorbidities of adult-onset diabetes mellitus, obstructive sleep apnea, hypertension and chronic back and joint pain.  After extensive preoperative work-up and discussion detailed elsewhere we have elected to proceed with laparoscopic sleeve gastrectomy and surgical treatment for his morbid obesity.    Procedure Detail: Patient was brought to the operating room, placed in supine position on the operating table, and general endotracheal anesthesia induced.  The abdomen was widely sterilely prepped and draped.  He received preoperative IV antibiotics and subcutaneous heparin.  PAS were in place.  Patient timeout was performed and correct procedure verified.  Access was obtained with a 5 mm Optiview trocar in the left upper quadrant without difficulty and pneumoperitoneum established.  Under direct vision a 5 mm trocar was placed laterally in the right upper quadrant, a 15 mm trocar in the upper right abdomen near the base of the falciform ligament, 5 mm trocar above in the left of the umbilicus for the camera port.  Through a 5 mm subxiphoid site the [facility] retractor was placed to the left lobe liver elevated with good exposure of the entire stomach and hiatus.  Finally a 5 mm trocar was placed laterally in the left upper quadrant.  A bilateral T AP block was performed with dilute Exparel.  The dissection was begun along the radial curve with the harmonic scalpel and the lesser sac entered.  The mesentery was fairly thick and fatty.  The dissection progressed satisfactorily.  Short gastrics  were taken down with Harmonic Scalpel mobilizing the fundus and some attachments along the spleen were divided further mobilizing.  The left crus was fully dissected and the hiatus identified and the stomach was completely freed over to the lesser curve.  We then dissected distally along the greater curve to a point measured about 5 to 6 cm from the pylorus.  There were some avascular posterior attachments were divided with scissors until the stomach was completely freed along the lesser curve vasculature.  The 62 French VISI G was passed orally and advanced to the pylorus, position along the lesser curve with the stomach symmetrically splayed out and placed on continuous suction.  The sleeve was begun with an additional firing of the green load 60 mm stapler beginning about 5 to 6 cm from the pylorus and angling well away from the incisura.  To further firings of the gold load 60 mm stapler were used to carry the staple line up past the incisura allowing some extra room adjacent to the tube at this area.  To allow a good angle progressing proximally I replaced the 5 mm trocar in the mid left upper quadrant with a 12 mm trocar for the stapler.  The sleeve was then completed with 3 further firings of the 60 mm blue load stapler staying closer to but not tight against the VISI G-tube in the upper stomach and the final staple line angling just out lateral to the esophageal fat pad at right angles to the gastric wall.  The sleeve appeared symmetric without narrowing or twisting.  It was insufflated using the VISI G-tube and under tense insufflation under irrigation there was no evidence of leak and again it  appeared symmetrical.  The sleeve was desufflated and the VISI G-tube removed.  The specimen was brought out through the 15 mm trocar site after dilating the slightly and the fascia at this site was closed with a 0 Vicryl using the Weck closure device.  The staple line of the sleeve was completely coated with Evasel.   The Nathanson retractor was removed under direct vision.  There was no evidence of bleeding or injury or other problems.  All CO2 was evacuated and trochars removed.  Skin incisions were closed with subarticular Monocryl and Dermabond.  Sponge needle and instrument counts were correct.    Findings: As above  Estimated Blood Loss:  less than 50 mL         Drains: None  Blood Given: none          Specimens: Curvature of stomach        Complications:  * No complications entered in OR log *         Disposition: PACU - hemodynamically stable.         Condition: stable

## 2017-09-09 NOTE — Progress Notes (Signed)
Discussed post op day goals with patient including ambulation, IS, diet progression, pain, and nausea control.  Questions answered. 

## 2017-09-09 NOTE — Discharge Instructions (Signed)
° ° ° °GASTRIC BYPASS/SLEEVE ° Home Care Instructions ° ° These instructions are to help you care for yourself when you go home. ° °Call: If you have any problems. °• Call [phone] and ask for the surgeon on call °• If you need immediate help, come to the ER at [facility].  °• Tell the ER staff that you are a new post-op gastric bypass or gastric sleeve patient °  °Signs and symptoms to report: • Severe vomiting or nausea °o If you cannot keep down clear liquids for longer than 1 day, call your surgeon  °• Abdominal pain that does not get better after taking your pain medication °• Fever over 100.4° F with chills °• Heart beating over 100 beats a minute °• Shortness of breath at rest °• Chest pain °•  Redness, swelling, drainage, or foul odor at incision (surgical) sites °•  If your incisions open or pull apart °• Swelling or pain in calf (lower leg) °• Diarrhea (Loose bowel movements that happen often), frequent watery, uncontrolled bowel movements °• Constipation, (no bowel movements for 3 days) if this happens: Pick one °o Milk of Magnesia, 2 tablespoons by mouth, 3 times a day for 2 days if needed °o Stop taking Milk of Magnesia once you have a bowel movement °o Call your doctor if constipation continues °Or °o Miralax  (instead of Milk of Magnesia) following the label instructions °o Stop taking Miralax once you have a bowel movement °o Call your doctor if constipation continues °• Anything you think is not normal °  °Normal side effects after surgery: • Unable to sleep at night or unable to focus °• Irritability or moody °• Being tearful (crying) or depressed °These are common complaints, possibly related to your anesthesia medications that put you to sleep, stress of surgery, and change in lifestyle.  This usually goes away a few weeks after surgery.  If these feelings continue, call your primary care doctor. °  °Wound Care: You may have surgical glue, steri-strips, or staples over your incisions after  surgery °• Surgical glue:  Looks like a clear film over your incisions and will wear off a little at a time °• Steri-strips: Strips of tape over your incisions. You may notice a yellowish color on the skin under the steri-strips. This is used to make the   steri-strips stick better. Do not pull the steri-strips off - let them fall off °• Staples: Staples may be removed before you leave the hospital °o If you go home with staples, call [facility], [phone] at for an appointment with your surgeon’s nurse to have staples removed 10 days after surgery. °• Showering: You may shower two (2) days after your surgery unless your surgeon tells you differently °o Wash gently around incisions with warm soapy water, rinse well, and gently pat dry  °o No tub baths until staples are removed, steri-strips fall off or glue is gone.  °  °Medications: • Medications should be liquid or crushed if larger than the size of a dime °• Extended release pills (medication that release a little bit at a time through the day) should NOT be crushed or cut. (examples include XL, ER, DR, SR) °• Depending on the size and number of medications you take, you may need to space (take a few throughout the day)/change the time you take your medications so that you do not over-fill your pouch (smaller stomach) °• Make sure you follow-up with your primary care doctor to   make medication changes needed during rapid weight loss and life-style changes °• If you have diabetes, follow up with the doctor that orders your diabetes medication(s) within one week after surgery and check your blood sugar regularly. °• Do not drive while taking prescription pain medication  °• It is ok to take Tylenol by the bottle instructions with your pain medicine or instead of your pain medicine as needed.  DO NOT TAKE NSAIDS (EXAMPLES OF NSAIDS:  IBUPROFREN/ NAPROXEN)  °Diet:                    First 2 Weeks ° You will see the dietician t about two (2) weeks  after your surgery. The dietician will increase the types of foods you can eat if you are handling liquids well: °• If you have severe vomiting or nausea and cannot keep down clear liquids lasting longer than 1 day, call your surgeon @ ([phone]) °Protein Shake °• Drink at least 2 ounces of shake 5-6 times per day °• Each serving of protein shakes (usually 8 - 12 ounces) should have: °o 15 grams of protein  °o And no more than 5 grams of carbohydrate  °• Goal for protein each day: °o Men = 80 grams per day °o Women = 60 grams per day °• Protein powder may be added to fluids such as non-fat milk or Lactaid milk or unsweetened Soy/Almond milk (limit to 35 grams added protein powder per serving) ° °Hydration °• Slowly increase the amount of water and other clear liquids as tolerated (See Acceptable Fluids) °• Slowly increase the amount of protein shake as tolerated  °•  Sip fluids slowly and throughout the day.  Do not use straws. °• May use sugar substitutes in small amounts (no more than 6 - 8 packets per day; i.e. Splenda) ° °Fluid Goal °• The first goal is to drink at least 8 ounces of protein shake/drink per day (or as directed by the nutritionist); some examples of protein shakes are Syntrax Nectar, Adkins Advantage, EAS Edge HP, and Unjury. See handout from pre-op Bariatric Education Class: °o Slowly increase the amount of protein shake you drink as tolerated °o You may find it easier to slowly sip shakes throughout the day °o It is important to get your proteins in first °• Your fluid goal is to drink 64 - 100 ounces of fluid daily °o It may take a few weeks to build up to this °• 32 oz (or more) should be clear liquids  °And  °• 32 oz (or more) should be full liquids (see below for examples) °• Liquids should not contain sugar, caffeine, or carbonation ° °Clear Liquids: °• Water or Sugar-free flavored water (i.e. Fruit H2O, Propel) °• Decaffeinated coffee or tea (sugar-free) °• Crystal Lite, Wyler’s Lite,  Minute Maid Lite °• Sugar-free Jell-O °• Bouillon or broth °• Sugar-free Popsicle:   *Less than 20 calories each; Limit 1 per day ° °Full Liquids: °Protein Shakes/Drinks + 2 choices per day of other full liquids °• Full liquids must be: °o No More Than 15 grams of Carbs per serving  °o No More Than 3 grams of Fat per serving °• Strained low-fat cream soup (except Cream of Potato or Tomato) °• Non-Fat milk °• Fat-free Lactaid Milk °• Unsweetened Soy Or Unsweetened Almond Milk °• Low Sugar yogurt (Dannon Lite & Fit, Greek yogurt; Oikos Triple Zero; Chobani Simply 100; Yoplait 100 calorie Greek - No Fruit on the Bottom) ° °  °Vitamins   and Minerals • Start 1 day after surgery unless otherwise directed by your surgeon °• 2 Chewable Bariatric Specific Multivitamin / Multimineral Supplement with iron (Example: Bariatric Advantage Multi EA) °• Chewable Calcium with Vitamin D-3 °(Example: 3 Chewable Calcium Plus 600 with Vitamin D-3) °o Take 500 mg three (3) times a day for a total of 1500 mg each day °o Do not take all 3 doses of calcium at one time as it may cause constipation, and you can only absorb 500 mg  at a time  °o Do not mix multivitamins containing iron with calcium supplements; take 2 hours apart °• Menstruating women and those with a history of anemia (a blood disease that causes weakness) may need extra iron °o Talk with your doctor to see if you need more iron °• Do not stop taking or change any vitamins or minerals until you talk to your dietitian or surgeon °• Your Dietitian and/or surgeon must approve all vitamin and mineral supplements °  °Activity and Exercise: Limit your physical activity as instructed by your doctor.  It is important to continue walking at home.  During this time, use these guidelines: °• Do not lift anything greater than ten (10) pounds for at least two (2) weeks °• Do not go back to work or drive until your surgeon says you can °• You may have sex when you feel comfortable  °o It is  VERY important for male patients to use a reliable birth control method; fertility often increases after surgery  °o All hormonal birth control will be ineffective for 30 days after surgery due to medications given during surgery a barrier method must be used. °o Do not get pregnant for at least 18 months °• Start exercising as soon as your doctor tells you that you can °o Make sure your doctor approves any physical activity °• Start with a simple walking program °• Walk 5-15 minutes each day, 7 days per week.  °• Slowly increase until you are walking 30-45 minutes per day °Consider joining our BELT program. [phone] or email [email or web address] °  °Special Instructions Things to remember: °• Use your CPAP when sleeping if this applies to you ° °• [facility] has two free Bariatric Surgery Support Groups that meet monthly °o The 3rd Thursday of each month, 6 pm, [facility]  °o The 2nd Friday of each month, 11:45 am in the private dining room in the basement of  °• It is very important to keep all follow up appointments with your surgeon, dietitian, primary care physician, and behavioral health practitioner °• Routine follow up schedule with your surgeon include appointments at 2-3 weeks, 6-8 weeks, 6 months, and 1 year at a minimum.  Your surgeon may request to see you more often.   °o After the first year, please follow up with your bariatric surgeon and dietitian at least once a year in order to maintain best weight loss results °[facility]: [phone] °[facility]: [phone] °Bariatric Nurse Coordinator: [phone] °  °   Reviewed and Endorsed  °by [facility] Patient Education Committee, June, 2016 °Edits Approved: Aug, 2018 ° ° ° °

## 2017-09-09 NOTE — Interval H&P Note (Signed)
History and Physical Interval Note:  09/09/2017 8:57 AM  Adam [name]  has presented today for surgery, with the diagnosis of MORBID OBESITY  The various methods of treatment have been discussed with the patient and family. After consideration of risks, benefits and other options for treatment, the patient has consented to  Procedure(s): LAPAROSCOPIC GASTRIC SLEEVE RESECTION WITH UPPER ENDO AND ERAS PATHWAY (N/A) as a surgical intervention .  The patient's history has been reviewed, patient examined, no change in status, stable for surgery.  I have reviewed the patient's chart and labs.  Questions were answered to the patient's satisfaction.     Darene Lamer Raymie Giammarco

## 2017-09-09 NOTE — Anesthesia Preprocedure Evaluation (Addendum)
Anesthesia Evaluation  Patient identified by MRN, date of birth, ID band Patient awake    Reviewed: Allergy & Precautions, NPO status , Patient's Chart, lab work & pertinent test results  Airway Mallampati: III  TM Distance: >3 FB Neck ROM: Full    Dental  (+) Teeth Intact, Dental Advisory Given   Pulmonary sleep apnea and Continuous Positive Airway Pressure Ventilation ,    Pulmonary exam normal breath sounds clear to auscultation       Cardiovascular hypertension, Pt. on medications Normal cardiovascular exam Rhythm:Regular Rate:Normal     Neuro/Psych PSYCHIATRIC DISORDERS Anxiety negative neurological ROS     GI/Hepatic Neg liver ROS, GERD  Medicated,  Endo/Other  diabetes, Type 2, Insulin Dependent, Oral Hypoglycemic AgentsMorbid obesity  Renal/GU negative Renal ROS     Musculoskeletal  (+) Arthritis , Osteoarthritis,    Abdominal   Peds  Hematology negative hematology ROS (+)   Anesthesia Other Findings Day of surgery medications reviewed with the patient.  Reproductive/Obstetrics                           Anesthesia Physical Anesthesia Plan  ASA: III  Anesthesia Plan: General   Post-op Pain Management:    Induction: Intravenous  PONV Risk Score and Plan: 3 and Scopolamine patch - Pre-op, Midazolam, Dexamethasone and Ondansetron  Airway Management Planned: Oral ETT  Additional Equipment:   Intra-op Plan:   Post-operative Plan: Extubation in OR  Informed Consent: I have reviewed the patients History and Physical, chart, labs and discussed the procedure including the risks, benefits and alternatives for the proposed anesthesia with the patient or authorized representative who has indicated his/her understanding and acceptance.   Dental advisory given  Plan Discussed with: CRNA  Anesthesia Plan Comments:        Anesthesia Quick Evaluation

## 2017-09-09 NOTE — Transfer of Care (Signed)
Immediate Anesthesia Transfer of Care Note  Patient: Adam [name]  Procedure(s) Performed: LAPAROSCOPIC GASTRIC SLEEVE RESECTION  AND ERAS PATHWAY (N/A Abdomen)  Patient Location: PACU  Anesthesia Type:General  Level of Consciousness: drowsy and patient cooperative  Airway & Oxygen Therapy: Patient Spontanous Breathing and Patient connected to face mask oxygen  Post-op Assessment: Report given to RN and Post -op Vital signs reviewed and stable  Post vital signs: Reviewed and stable  Last Vitals:  Vitals Value Taken Time  BP 175/76 09/09/2017 12:00 PM  Temp    Pulse 93 09/09/2017 12:03 PM  Resp 12 09/09/2017 12:03 PM  SpO2 96 % 09/09/2017 12:03 PM  Vitals shown include unvalidated device data.  Last Pain:  Vitals:   09/09/17 0809  TempSrc:   PainSc: 0-No pain      Patients Stated Pain Goal: 4 (00/93/81 8299)  Complications: No apparent anesthesia complications

## 2017-09-10 LAB — CBC WITH DIFFERENTIAL/PLATELET
BASOS ABS: 0 10*3/uL (ref 0.0–0.1)
Basophils Relative: 0 %
EOS ABS: 0 10*3/uL (ref 0.0–0.7)
EOS PCT: 0 %
HCT: 39.1 % (ref 39.0–52.0)
HEMOGLOBIN: 12.8 g/dL — AB (ref 13.0–17.0)
Lymphocytes Relative: 14 %
Lymphs Abs: 1.3 10*3/uL (ref 0.7–4.0)
MCH: 31.3 pg (ref 26.0–34.0)
MCHC: 32.7 g/dL (ref 30.0–36.0)
MCV: 95.6 fL (ref 78.0–100.0)
Monocytes Absolute: 0.9 10*3/uL (ref 0.1–1.0)
Monocytes Relative: 9 %
NEUTROS PCT: 77 %
Neutro Abs: 7.3 10*3/uL (ref 1.7–7.7)
PLATELETS: 132 10*3/uL — AB (ref 150–400)
RBC: 4.09 MIL/uL — AB (ref 4.22–5.81)
RDW: 13.7 % (ref 11.5–15.5)
WBC: 9.5 10*3/uL (ref 4.0–10.5)

## 2017-09-10 LAB — GLUCOSE, CAPILLARY
GLUCOSE-CAPILLARY: 162 mg/dL — AB (ref 65–99)
GLUCOSE-CAPILLARY: 174 mg/dL — AB (ref 65–99)
Glucose-Capillary: 147 mg/dL — ABNORMAL HIGH (ref 65–99)

## 2017-09-10 MED ORDER — INSULIN GLARGINE 100 UNIT/ML ~~LOC~~ SOLN: 25.0000 [IU] | Freq: Every day | SUBCUTANEOUS | 3 refills | Status: DC

## 2017-09-10 MED FILL — oxyCODONE HCL 5 MG/5ML SOLN: 5 | 2 days supply | Qty: 120 | Fill #0

## 2017-09-10 NOTE — Progress Notes (Signed)
Patient alert and oriented, Post op day 1.  Provided support and encouragement.  Encouraged pulmonary toilet, ambulation and small sips of liquids.  All questions answered.  Will continue to monitor. 

## 2017-09-10 NOTE — Consult Note (Signed)
   [facility] CM Inpatient Consult   09/10/2017  Rudie Sermons 02/25/1971 [id number]    Went to bedside to speak with Mr. Messer on behalf of [employer] for [employer] employees/dependents with [facility] insurance. Wife is a Furniture conservator/restorer.   Discussed [facility] Management Telephonic RNCM will call post discharge. Provided 24-hr nurse advice line magnet.   Expressed appreciation of visit.   Marthenia Rolling, MSN-Ed, RN,BSN [facility] Liaison [phone]

## 2017-09-10 NOTE — Progress Notes (Signed)
Patient alert and oriented, pain is controlled. Patient is tolerating fluids, advanced to protein shake today, patient is tolerating well.  Reviewed Gastric sleeve discharge instructions with patient and patient is able to articulate understanding.  Provided information on BELT program, Support Group and [facility] outpatient pharmacy. All questions answered, will continue to monitor.   24 hour total fluid recall 880 Per dehydration protocol call back within one week of surgery

## 2017-09-10 NOTE — Progress Notes (Signed)
Inpatient Diabetes Program Recommendations  AACE/ADA: New Consensus Statement on Inpatient Glycemic Control (2015)  Target Ranges:  Prepandial:   less than 140 mg/dL      Peak postprandial:   less than 180 mg/dL (1-2 hours)      Critically ill patients:  140 - 180 mg/dL   Results for ZARION, OLIFF (MRN [id number]) as of 09/10/2017 07:29  Ref. Range 09/09/2017 07:49 09/09/2017 12:03 09/09/2017 15:34 09/09/2017 20:12 09/10/2017 00:26 09/10/2017 04:15  Glucose-Capillary Latest Ref Range: 65 - 99 mg/dL 172 (H) 164 (H) 185 (H) 197 (H) 174 (H) 162 (H)   Results for DWYANE, DUPREE (MRN [id number]) as of 09/10/2017 07:29  Ref. Range 09/04/2017 08:54  Hemoglobin A1C Latest Ref Range: 4.8 - 5.6 % 7.4 (H)    Home DM Meds: Lantus 50 units QHS        Regular 2 units for every 50 mg/dl >200 mg/dl        Actos 45 mg daily       Metformin 1000 mg BID  Current Orders: Lantus 15 units QHS       Novolog Moderate Correction Scale/ SSI (0-15 units) Q4 hours      CBGs well controlled post-op on much lower dose of Lantus.  Patient needs to follow up with his PCP soon after discharge to discuss further insulin adjustments post-surgery as patient's insulin needs may likely decrease significantly.      --Will follow patient during hospitalization--  Wyn Quaker RN, MSN, CDE Diabetes Coordinator Inpatient Glycemic Control Team Team Pager: [phone] (8a-5p)

## 2017-09-10 NOTE — Discharge Summary (Signed)
Patient ID: Adam [name] [id number] 47 y.o. [birth year]  09/09/2017  Discharge date and time: 09/10/2017   Admitting Physician: Edward Jolly  Discharge Physician: Edward Jolly  Admission Diagnoses: MORBID OBESITY  Discharge Diagnoses: Same  Operations: Procedure(s): [place]  Admission Condition: good  Discharged Condition: good  Indication for Admission: Patient with progressive morbid obesity unresponsive to multiple efforts at medical management who presents with a BMI of 43 and comorbidities of adult-onset diabetes mellitus, obstructive sleep apnea, hypertension and chronic back and joint pain.  After extensive preoperative work-up and discussion detailed elsewhere he is electively admitted following laparoscopic sleeve gastrectomy for surgical treatment of his morbid obesity.    Hospital Course: On the morning of admission the patient underwent an uneventful laparoscopic sleeve gastrectomy.  His postoperative course was uncomplicated.  He had minimal discomfort and no nausea.  Started liquids without difficulty on the operative day and was ambulatory.  On the first postoperative morning he is afebrile with normal vital signs.  Denies pain or nausea.  Tolerating fluids and protein shakes.  Had a small bowel movement.  Abdomen is soft and nontender.  Incisions without erythema or drainage and CBC is normal.  He is felt ready for discharge.   Disposition: Home  Patient Instructions:  Allergies as of 09/10/2017   No Known Allergies     Medication List    TAKE these medications   amLODipine 5 MG tablet Commonly known as:  NORVASC TAKE 1 TABLET (5 MG TOTAL) BY MOUTH DAILY. Notes to patient:  Monitor Blood Pressure Daily and keep a log for primary care physician.  You may need to make changes to your medications with rapid weight loss.     aspirin EC 81 MG tablet Take 81 mg by mouth daily.   baclofen 10 MG  tablet Commonly known as:  LIORESAL Take 10 mg by mouth daily.   cyclobenzaprine 10 MG tablet Commonly known as:  FLEXERIL TAKE 1 TABLET BY MOUTH 3 TIMES DAILY AS NEEDED FOR MUSCLE SPASMS **START TAKING THIS AT NIGHT BECAUSE CAN CAUSE DROWSINESS   DULoxetine 60 MG capsule Commonly known as:  CYMBALTA Take 60 mg by mouth daily.   hydrochlorothiazide 25 MG tablet Commonly known as:  HYDRODIURIL Take 25 mg by mouth daily. Notes to patient:  Monitor Blood Pressure Daily and keep a log for primary care physician.  Monitor for symptoms of dehydration.  You may need to make changes to your medications with rapid weight loss.     HYDROcodone-acetaminophen 7.5-325 MG tablet Commonly known as:  NORCO Take 1 tablet by mouth 3 (three) times daily as needed for moderate pain. What changed:  Another medication with the same name was removed. Continue taking this medication, and follow the directions you see here.   insulin glargine 100 UNIT/ML injection Commonly known as:  LANTUS Inject 0.25 mLs (25 Units total) into the skin at bedtime. What changed:  how much to take   insulin regular 100 units/mL injection Commonly known as:  NOVOLIN R,HUMULIN R Sliding Scale: 2 units for every 50 over 200. Notes to patient:  Monitor Blood Sugar Frequently and keep a log for primary care physician, you may need to adjust medication dosage with rapid weight loss.     loratadine 10 MG tablet Commonly known as:  CLARITIN Take 10 mg by mouth daily.   losartan 100 MG tablet Commonly known as:  COZAAR Take 1 tablet (100 mg total) by mouth daily.  Notes to patient:  Monitor Blood Pressure Daily and keep a log for primary care physician.  You may need to make changes to your medications with rapid weight loss.     metFORMIN 1000 MG tablet Commonly known as:  GLUCOPHAGE TAKE 1 TABLET BY MOUTH 2 TIMES DAILY WITH A MEAL. Notes to patient:  Monitor Blood Sugar Frequently and keep a log for primary care physician,  you may need to adjust medication dosage with rapid weight loss.     multivitamin with minerals Tabs tablet Take 1 tablet by mouth daily.   ondansetron 4 MG disintegrating tablet Commonly known as:  ZOFRAN-ODT DISSOLVE 1 TABLET BY MOUTH EVERY 6 HOURS AS NEEDED FOR NAUSEA   pantoprazole 40 MG tablet Commonly known as:  PROTONIX Take 40 mg by mouth daily.   pioglitazone 45 MG tablet Commonly known as:  ACTOS TAKE 1 TABLET BY MOUTH DAILY. Notes to patient:  Monitor Blood Sugar Frequently and keep a log for primary care physician, you may need to adjust medication dosage with rapid weight loss.     simvastatin 20 MG tablet Commonly known as:  ZOCOR TAKE 1 TABLET (20 MG TOTAL) BY MOUTH DAILY.   tadalafil 20 MG tablet Commonly known as:  CIALIS TAKE 1 TABLET BY MOUTH DAILY AS NEEDED FOR ERECTILE DYSFUNCTION.       Activity: activity as tolerated Diet: Bariatric protein shakes Wound Care: none needed  Follow-up:  With Dr. Excell Seltzer in 3 weeks.  Signed: Edward Jolly MD, FACS  09/10/2017, 7:41 AM

## 2017-09-10 NOTE — Anesthesia Postprocedure Evaluation (Signed)
Anesthesia Post Note  Patient: Adam [name]  Procedure(s) Performed: LAPAROSCOPIC GASTRIC SLEEVE RESECTION  AND ERAS PATHWAY (N/A Abdomen)     Patient location during evaluation: PACU Anesthesia Type: General Level of consciousness: awake and alert Pain management: pain level controlled Vital Signs Assessment: post-procedure vital signs reviewed and stable Respiratory status: spontaneous breathing, nonlabored ventilation and respiratory function stable Cardiovascular status: blood pressure returned to baseline and stable Postop Assessment: no apparent nausea or vomiting Anesthetic complications: no    Last Vitals:  Vitals:   09/10/17 0028 09/10/17 0418  BP: 135/72 131/81  Pulse: 60 (!) 53  Resp: 18 16  Temp: (!) 36.4 C 36.5 C  SpO2: 94% 95%    Last Pain:  Vitals:   09/10/17 1213  TempSrc:   PainSc: 7                  Catalina Gravel

## 2017-09-10 NOTE — Plan of Care (Signed)
Nutrition Education Note  Received consult for diet education per DROP protocol.   Discussed 2 week post op diet with pt. Emphasized that liquids must be non carbonated, non caffeinated, and sugar free. Fluid goals discussed. Pt to follow up with outpatient bariatric RD for further diet progression after 2 weeks. Multivitamins and minerals also reviewed. Teach back method used, pt expressed understanding, expect good compliance.   Diet: First 2 Weeks  You will see the nutritionist about two (2) weeks after your surgery. The nutritionist will increase the types of foods you can eat if you are handling liquids well:  If you have severe vomiting or nausea and cannot handle clear liquids lasting longer than 1 day, call your surgeon  Protein Shake  Drink at least 2 ounces of shake 5-6 times per day  Each serving of protein shakes (usually 8 - 12 ounces) should have a minimum of:  15 grams of protein  And no more than 5 grams of carbohydrate  Goal for protein each day:  Men = 80 grams per day  Women = 60 grams per day  Protein powder may be added to fluids such as non-fat milk or Lactaid milk or Soy milk (limit to 35 grams added protein powder per serving)   Hydration  Slowly increase the amount of water and other clear liquids as tolerated (See Acceptable Fluids)  Slowly increase the amount of protein shake as tolerated  Sip fluids slowly and throughout the day  May use sugar substitutes in small amounts (no more than 6 - 8 packets per day; i.e. Splenda)   Fluid Goal  The first goal is to drink at least 8 ounces of protein shake/drink per day (or as directed by the nutritionist); some examples of protein shakes are Premier Protein, Syntrax Nectar, Adkins Advantage, EAS Edge HP, and Unjury. See handout from pre-op Bariatric Education Class:  Slowly increase the amount of protein shake you drink as tolerated  You may find it easier to slowly sip shakes throughout the day  It is important to  get your proteins in first  Your fluid goal is to drink 64 - 100 ounces of fluid daily  It may take a few weeks to build up to this  32 oz (or more) should be clear liquids  And  32 oz (or more) should be full liquids (see below for examples)  Liquids should not contain sugar, caffeine, or carbonation   Clear Liquids:  Water or Sugar-free flavored water (i.e. Fruit H2O, Propel)  Decaffeinated coffee or tea (sugar-free)  Crystal Lite, Wyler?s Lite, Minute Maid Lite  Sugar-free Jell-O  Bouillon or broth  Sugar-free Popsicle: *Less than 20 calories each; Limit 1 per day   Full Liquids:  Protein Shakes/Drinks + 2 choices per day of other full liquids  Full liquids must be:  No More Than 12 grams of Carbs per serving  No More Than 3 grams of Fat per serving  Strained low-fat cream soup  Non-Fat milk  Fat-free Lactaid Milk  Sugar-free yogurt (Dannon Lite & Fit, Greek yogurt, Oikos Zero)   Keelin Sheridan RD, LDN Clinical Nutrition Pager # - [phone]   

## 2017-09-12 ENCOUNTER — Telehealth (HOSPITAL_COMMUNITY): Payer: Self-pay

## 2017-09-12 NOTE — Telephone Encounter (Signed)
Patient called to discuss post bariatric surgery follow up questions.  See below:   1.  Tell me about your pain and pain management?has taken pain medication which helps  2.  Let's talk about fluid intake.  How much total fluid are you taking in?60ounces of fluid  3.  How much protein have you taken in the last 2 days?60-70 grams of protein  4.  Have you had nausea?  Tell me about when have experienced nausea and what you did to help?has experienced no nausea  5.  Has the frequency or color changed with your urine?urinating frequently darker in am then llightens as day goes on  6.  Tell me what your incisions look like?no problems  7.  Have you been passing gas? BM?passing gas and had bm today  8.  If a problem or question were to arise who would you call?  Do you know contact numbers for [place], [facility], and [facility]?knows how to contact all services  9.  How has the walking going?waling every hour has been a little more tired  10.  How are your vitamins and calcium going?  How are you taking them?taking BAri Advantage no problem although big suggested cutting them in half and taking calcium as directed.  No other questions at this time.

## 2017-09-13 ENCOUNTER — Other Ambulatory Visit: Payer: Self-pay | Admitting: *Deleted

## 2017-09-13 NOTE — Patient Outreach (Addendum)
[facility]) Care Management  09/13/2017  Adam [name] [birth year] [id number]   Subjective: Telephone call to patient's home / mobile number, spoke with patient, and HIPAA verified.  Discussed [facility] Care Management [facility] Transition of care follow up, patient voiced understanding, and is in agreement to follow up.    Patient states he is doing well and has a follow up appointment with surgeon on 09/23/17.   Patient voices understanding of medical diagnosis, surgery, and treatment plan. Patient states he is able to manage self care and has assistance as needed with activities of daily living / home management. States he is accessing the following [facility] benefits: outpatient pharmacy, hospital indemnity, and has not needed family medical leave act (FMLA).   States he remains active with [facility] diabetes management program phone application.  Patient states he does not have any education material, transition of care, care coordination, transportation, community resource, or pharmacy needs at this time.  States he is very appreciative of the follow up and is in agreement to receive [place] Management information.    Objective:  Per KPN (Knowledge Performance Now, point of care tool) and chart review,  Patient hospitalized 09/09/17 -09/10/17 for morbid obesity and status post [place]ol[place]o on 09/09/17.   Patient also has a history diabetes, hypertension, and OSA.       Assessment: Received [facility] Transition of care referral on 08/05/17.   Transition of care follow up completed, no care management needs, and will proceed with case closure.      Plan: RNCM will send patient successful outreach letter, [facility] pamphlet, and magnet. RNCM will complete case closure due to follow up completed / no care management needs.      Georgie Eduardo H. Annia Friendly, BSN, [place] [facility] Telephonic CM Phone: [phone] Fax: [phone]

## 2017-09-24 ENCOUNTER — Encounter: Payer: 59 | Attending: General Surgery | Admitting: Skilled Nursing Facility1

## 2017-09-24 DIAGNOSIS — Z713 Dietary counseling and surveillance: Secondary | ICD-10-CM | POA: Insufficient documentation

## 2017-09-24 DIAGNOSIS — E119 Type 2 diabetes mellitus without complications: Secondary | ICD-10-CM

## 2017-09-25 ENCOUNTER — Encounter: Payer: Self-pay | Admitting: Skilled Nursing Facility1

## 2017-09-25 NOTE — Progress Notes (Signed)
Bariatric Class:  Appt start time: 1530 end time:  1630.  2 Week Post-Operative Nutrition Class  Patient was seen on 09/24/2017 for Post-[facility]ative [facility]   Pt states his blood sugars are 140 or lower with A1C 6.8.   Surgery date: 09/09/2017 Surgery type: sleeve Start weight at [facility]: 319.2 Weight today: 326.1  TANITA  BODY COMP RESULTS  09/24/2017   BMI (kg/m^2) 43.9   Fat Mass (lbs) 159.8   Fat Free Mass (lbs) 146.2   Total Body Water (lbs) N/A   The following the learning objectives were met by the patient during this course:  Identifies Phase 3A (Soft, High Proteins) Dietary Goals and will begin from 2 weeks post-operatively to 2 months post-operatively  Identifies appropriate sources of fluids and proteins   States protein recommendations and appropriate sources post-operatively  Identifies the need for appropriate texture modifications, mastication, and bite sizes when consuming solids  Identifies appropriate multivitamin and calcium sources post-operatively  Describes the need for physical activity post-operatively and will follow MD recommendations  States when to call healthcare provider regarding medication questions or post-operative complications  Handouts given during class include:  Phase 3A: Soft, High Protein Diet Handout  Follow-Up Plan: Patient will follow-up at [facility] in 6 weeks for 2 month post-op nutrition visit for diet advancement per MD.    

## 2017-09-30 MED FILL — LOSARTAN POTASSIUM 100 MG T: 100 | 90 days supply | Qty: 90 | Fill #1

## 2017-10-02 DIAGNOSIS — M542 Cervicalgia: Secondary | ICD-10-CM | POA: Diagnosis not present

## 2017-10-02 DIAGNOSIS — G894 Chronic pain syndrome: Secondary | ICD-10-CM | POA: Diagnosis not present

## 2017-10-04 ENCOUNTER — Telehealth: Payer: Self-pay | Admitting: Registered"

## 2017-10-04 NOTE — Telephone Encounter (Signed)
RD called pt to verify fluid intake once starting soft, solid proteins 2 week post-bariatric surgery.   Daily Fluid intake: 64+ ounces Daily Protein intake: 80+ grams  Concerns/issues: none stated

## 2017-10-08 ENCOUNTER — Other Ambulatory Visit: Payer: Self-pay | Admitting: Student in an Organized Health Care Education/Training Program

## 2017-10-08 MED FILL — TADALAFIL 20 MG TABS: 20 | 50 days supply | Qty: 10 | Fill #0

## 2017-10-28 ENCOUNTER — Ambulatory Visit (INDEPENDENT_AMBULATORY_CARE_PROVIDER_SITE_OTHER): Payer: 59 | Admitting: Student in an Organized Health Care Education/Training Program

## 2017-10-28 ENCOUNTER — Other Ambulatory Visit: Payer: Self-pay

## 2017-10-28 ENCOUNTER — Encounter: Payer: Self-pay | Admitting: Student in an Organized Health Care Education/Training Program

## 2017-10-28 VITALS — BP 138/70 | HR 80 | Temp 98.1°F | Wt 304.0 lb

## 2017-10-28 DIAGNOSIS — B079 Viral wart, unspecified: Secondary | ICD-10-CM | POA: Diagnosis not present

## 2017-10-28 DIAGNOSIS — Z79891 Long term (current) use of opiate analgesic: Secondary | ICD-10-CM

## 2017-10-28 DIAGNOSIS — B078 Other viral warts: Secondary | ICD-10-CM

## 2017-10-28 DIAGNOSIS — G8929 Other chronic pain: Secondary | ICD-10-CM

## 2017-10-28 DIAGNOSIS — M47816 Spondylosis without myelopathy or radiculopathy, lumbar region: Secondary | ICD-10-CM

## 2017-10-28 DIAGNOSIS — E1169 Type 2 diabetes mellitus with other specified complication: Secondary | ICD-10-CM | POA: Diagnosis not present

## 2017-10-28 DIAGNOSIS — Z794 Long term (current) use of insulin: Secondary | ICD-10-CM

## 2017-10-28 DIAGNOSIS — I10 Essential (primary) hypertension: Secondary | ICD-10-CM

## 2017-10-28 DIAGNOSIS — Z9884 Bariatric surgery status: Secondary | ICD-10-CM | POA: Diagnosis not present

## 2017-10-28 DIAGNOSIS — Z79899 Other long term (current) drug therapy: Secondary | ICD-10-CM

## 2017-10-28 DIAGNOSIS — E119 Type 2 diabetes mellitus without complications: Secondary | ICD-10-CM

## 2017-10-28 MED ORDER — CYCLOBENZAPRINE HCL 10 MG PO TABS: 10.0000 mg | ORAL_TABLET | Freq: Every day | ORAL | 3 refills | Status: DC

## 2017-10-28 MED ORDER — DULOXETINE HCL 60 MG PO CPEP: 60.0000 mg | ORAL_CAPSULE | Freq: Every day | ORAL | 3 refills | Status: DC

## 2017-10-28 MED ORDER — PIOGLITAZONE HCL 45 MG PO TABS: 45.0000 mg | ORAL_TABLET | Freq: Every day | ORAL | 3 refills | Status: DC

## 2017-10-28 MED FILL — CYCLOBENZAPRINE 10 MG TAB: 10 | 90 days supply | Qty: 90 | Fill #0

## 2017-10-28 MED FILL — DULoxetine HCL 60 MG CPEP: 60 | 90 days supply | Qty: 90 | Fill #0

## 2017-10-28 MED FILL — PIOGLITAZONE HCL 45 MG TAB: 45 | 90 days supply | Qty: 90 | Fill #0

## 2017-10-28 NOTE — Assessment & Plan Note (Signed)
Stable to a little improved.  He has had 20 pound weight loss since weight reduction surgery in May.  Currently goes to restoration of pain clinic and sees Dr. Phineas Douglas, is using hydrocodone a couple times a day for chronic pain.  Having really difficult experience, co-pays are almost $200 per month.  I advised that I am willing to take over her chronic pain management in his case if he wants.  He is going to think about it and will let me know.

## 2017-10-28 NOTE — Progress Notes (Signed)
   Assessment and Plan:  See Encounters tab for problem-based medical decision making.   __________________________________________________________  HPI:   47 year old male here for up of diabetes.  Since I last saw the patient he underwent weight reduction surgery at the end of March.  He had a gastric sleeve placed with [place] surgery.  He says that the operation went very well.  He is eating and drinking well, his portion sizes have decreased dramatically.  Reports expected early satiety.  He has had about 20 pound weight loss since the surgery.  He feels overall much healthier.  He thinks it is improving his back pain is well.  He is continue to use his usual locations including insulin and his blood pressure medications.  Though he is monitoring him closely at home.  Denies any nausea or vomiting.  No hypoglycemia.  He wants to get into more stable exercise routine.  He is following up with nutrition regularly.    __________________________________________________________  Problem List: Patient Active Problem List   Diagnosis Date Noted  . Obesity, Class III, BMI 40-49.9 (morbid obesity) ([place]) 11/12/2016    Priority: High  . Type 2 diabetes mellitus with other specified complication ([place]) 65/68/1275    Priority: High  . Hearing loss 11/12/2016    Priority: Medium  . Osteoarthritis of lumbar spine 04/09/2016    Priority: Medium  . Tendinopathy of left rotator cuff 12/30/2015    Priority: Medium  . Obstructive sleep apnea 02/28/2015    Priority: Medium  . Essential hypertension 01/06/2015    Priority: Medium  . Lateral pain of left hip 12/06/2016    Priority: Low  . Erectile dysfunction 01/06/2015    Priority: Low  . Healthcare maintenance 01/06/2015    Priority: Low  . Verruca 07/01/2017    Medications: Reconciled today in Epic __________________________________________________________  Physical Exam:  Vital Signs: Vitals:   10/28/17 0947  BP: 138/70    Pulse: 80  Temp: 98.1 F (36.7 C)  TempSrc: Oral  SpO2: 95%  Weight: (!) 304 lb (137.9 kg)    Gen: Well appearing, NAD Neck: No cervical LAD, No thyromegaly or nodules, No JVD. CV: RRR, no murmurs Pulm: Normal effort, CTA throughout, no wheezing Abd: Soft, NT, ND, well-healed laparoscopic scars with no signs of infection. Ext: Warm,  1+ nonpitting edema , normal joints Skin: right palm has a 22mm wart

## 2017-10-28 NOTE — Assessment & Plan Note (Signed)
Is a persistent verruca on his right palm, improved somewhat after our last cryo treatment in March.  Did have a blister so may not have had a full freeze.  We decided to try with another round of cryotherapy today.  Procedure note: I applied liquid nitrogen to the wart on his right palm for 10 seconds freeze cycles.  We did 3 cycles.  Given precautions about blistering and wound care.

## 2017-10-28 NOTE — Assessment & Plan Note (Signed)
Blood pressure is at goal.  Plan to continue with hydrochlorothiazide 25, amlodipine 5, and losartan 100 mg daily.

## 2017-10-28 NOTE — Patient Instructions (Signed)
Keep up the great work with your diet and exercise.  We will see you back in 3 months and we will recheck your A1c.

## 2017-10-28 NOTE — Assessment & Plan Note (Signed)
Doing well status post gastric sleeve weight reduction surgery about 2 months ago.  Weight is down about 20 pounds since that surgery.  Blood sugars have been stable at home.  Meal size portions are decreasing as planned.  He does a great job of checking his fingerstick glucose at home and adjusting as needed.  Plan is to continue with Lantus 25 units once daily, pioglitazone 45 mg daily, and he uses a regular insulin sliding scale for hyperglycemia.  Continue with simvastatin and aspirin for primary prevention of ischemic vascular disease.  Follow-up in 3 months and we can recheck his A1c then.  In the future hoping to be able to down titrate to try to help with weight loss efforts.

## 2017-10-30 MED FILL — AMLODIPINE BESYLATE 5 MG TA: 5 | 90 days supply | Qty: 90 | Fill #1

## 2017-11-04 ENCOUNTER — Encounter: Payer: Self-pay | Admitting: Skilled Nursing Facility1

## 2017-11-04 ENCOUNTER — Encounter: Payer: 59 | Attending: General Surgery | Admitting: Skilled Nursing Facility1

## 2017-11-04 DIAGNOSIS — Z713 Dietary counseling and surveillance: Secondary | ICD-10-CM | POA: Diagnosis not present

## 2017-11-04 DIAGNOSIS — E119 Type 2 diabetes mellitus without complications: Secondary | ICD-10-CM

## 2017-11-04 NOTE — Progress Notes (Signed)
Follow-up visit:  8 Weeks Post-Operative sleeve Surgery Primary concerns today: Post-operative Bariatric Surgery Nutrition Management.   Pt states he gets hypoglycemic symptoms with glucose of 95. Pt states he will get canned vegetable for lunch. Pt is eating fruits.  Pt reports no issues and states he is feeling great.  Pt states he bought his wife a VW bug for her birthday and is going on vacation to [place].   Surgery date: 09/09/2017 Surgery type: sleeve Start weight at [facility]: 319.2 Weight: 296.6 Weight Change: 29.5  TANITA  BODY COMP RESULTS  09/24/2017 11/04/2017   BMI (kg/m^2) 43.9 42.6   Fat Mass (lbs) 159.8 174.6   Fat Free Mass (lbs) 146.2 122   Total Body Water (lbs) N/A N/A   24-hr recall: B (AM): banana and cheese Snk (AM):  L (PM): 5 ounces of tuna  Snk (PM): cheese  D (PM): broccoli and chicken  Snk (PM):   Fluid intake: water: 64+ Estimated total protein intake: 80+  Medications: see list  Supplementation: celebrate and tums   CBG monitoring: 1 time a day: fasting Average CBG per patient: 120 Last patient reported A1c:   Using straws: no Drinking while eating: no Having you been chewing well:no Chewing/swallowing difficulties: no Changes in vision: no Changes to mood/headaches: no Hair loss/Cahnges to skin/Changes to nails: no Any difficulty focusing or concentrating: no Sweating: no Dizziness/Lightheaded: no Palpitations: no  Carbonated beverages: no N/V/D/C/GAS: no Abdominal Pain: no Dumping syndrome: no  Recent physical activity:  Working outside on boat and car; trying to walk 3 times a week at work about 1 mile  Progress Towards Goal(s):  In progress.  Handouts given during visit include:  Non-starchy vegetables   Nutritional Diagnosis:  [place]-3.3 Overweight/obesity related to past poor dietary habits and physical inactivity as evidenced by patient w/ recent sleeve surgery following dietary guidelines for continued weight  loss.  Intervention:  Nutrition counseling. Dietitian educated the pt on the advancement of diet to include non-starchy vegetables. Goals: -increase your activity -Aim to have vegetables with every lunch and every dinner 7 days a week   Teaching Method Utilized:  Visual Auditory Hands on  Barriers to learning/adherence to lifestyle change: none identified   Demonstrated degree of understanding via:  Teach Back   Monitoring/Evaluation:  Dietary intake, exercise, and body weight.

## 2017-11-06 DIAGNOSIS — Z79891 Long term (current) use of opiate analgesic: Secondary | ICD-10-CM | POA: Diagnosis not present

## 2017-11-06 DIAGNOSIS — M542 Cervicalgia: Secondary | ICD-10-CM | POA: Diagnosis not present

## 2017-11-06 DIAGNOSIS — G894 Chronic pain syndrome: Secondary | ICD-10-CM | POA: Diagnosis not present

## 2017-11-18 ENCOUNTER — Other Ambulatory Visit: Payer: Self-pay | Admitting: Internal Medicine

## 2017-11-18 DIAGNOSIS — I10 Essential (primary) hypertension: Secondary | ICD-10-CM

## 2017-11-18 MED FILL — HYDROCHLOROTHIAZIDE 25 MG T: 25 | 90 days supply | Qty: 90 | Fill #0

## 2017-11-18 MED FILL — metFORMIN HCL 1000 MG TABS: 1000 | 90 days supply | Qty: 180 | Fill #2

## 2017-12-03 MED FILL — SIMVASTATIN 20 MG TABLET: 20 | 90 days supply | Qty: 90 | Fill #2

## 2017-12-06 ENCOUNTER — Telehealth: Payer: Self-pay

## 2017-12-06 MED ORDER — HYDROCODONE-ACETAMINOPHEN 7.5-325 MG PO TABS: 1.0000 | ORAL_TABLET | Freq: Two times a day (BID) | ORAL | 0 refills | Status: DC | PRN

## 2017-12-06 MED FILL — HYDROCODON-APAP 7.5-325: 7.5-325 | 22 days supply | Qty: 45 | Fill #0

## 2017-12-06 NOTE — Telephone Encounter (Signed)
Needs to speak with a nurse about pain med. Please call pt back.  

## 2017-12-06 NOTE — Telephone Encounter (Signed)
Rtc, lm for rtc 

## 2017-12-06 NOTE — Telephone Encounter (Signed)
Yes, we talked about this at our last visit in July. I am ok with this, he has chronic pain due to low back osteoarthritis and obesity. I will send a one month supply, #45 to start [place] out. I would like to see him in a visit so we can check a urine, go over our [facility] policy, and then provide a three month supply. I checked the PMP which shows last fill was 7/17.

## 2017-12-06 NOTE — Telephone Encounter (Signed)
Pt calls and states dr Evette Doffing told him he would give him vicodin 7.5/325mg  #45 monthly for his pain issues. Pt would like dr Evette Doffing to do this

## 2017-12-09 ENCOUNTER — Other Ambulatory Visit: Payer: Self-pay | Admitting: Student in an Organized Health Care Education/Training Program

## 2017-12-09 NOTE — Telephone Encounter (Signed)
Pt needs a new meter Freestyle meter, strips and lancets @ [facility]; pt cotnact# [phone]

## 2017-12-10 MED ORDER — FREESTYLE LANCETS MISC: 1 refills | Status: DC

## 2017-12-10 MED ORDER — FREESTYLE SYSTEM KIT: PACK | 0 refills | Status: DC

## 2017-12-10 MED ORDER — GLUCOSE BLOOD VI STRP: ORAL_STRIP | 1 refills | Status: DC

## 2017-12-10 MED FILL — FREESTYLE LITE METER: 1 days supply | Qty: 1 | Fill #0

## 2017-12-10 MED FILL — FREESTYLE LITE TEST STRIP: 90 days supply | Qty: 400 | Fill #0

## 2017-12-10 MED FILL — FREESTYLE LANCETS: 90 days supply | Qty: 400 | Fill #0

## 2017-12-10 NOTE — Telephone Encounter (Signed)
Dr. Evette Doffing will not be available the month of September in clinic.  Sending back to address if waiting until October is okay or does patient need to be seen in [facility].

## 2017-12-10 NOTE — Telephone Encounter (Signed)
Will work

## 2017-12-25 ENCOUNTER — Other Ambulatory Visit: Payer: Self-pay | Admitting: Student in an Organized Health Care Education/Training Program

## 2017-12-25 MED FILL — LOSARTAN POTASSIUM 100 MG T: 100 | 90 days supply | Qty: 90 | Fill #2

## 2017-12-25 MED FILL — TADALAFIL 20 MG TABS: 20 | 50 days supply | Qty: 10 | Fill #0

## 2018-01-13 ENCOUNTER — Other Ambulatory Visit: Payer: Self-pay | Admitting: Student in an Organized Health Care Education/Training Program

## 2018-01-13 MED FILL — HYDROCODON-APAP 7.5-325: 7.5-325 | 22 days supply | Qty: 45 | Fill #0

## 2018-01-28 ENCOUNTER — Ambulatory Visit: Payer: Self-pay

## 2018-01-29 MED FILL — metFORMIN HCL 1000 MG TABS: 1000 | 90 days supply | Qty: 180 | Fill #3

## 2018-01-29 MED FILL — DULoxetine HCL 60 MG CPEP: 60 | 90 days supply | Qty: 90 | Fill #1

## 2018-01-29 MED FILL — CYCLOBENZAPRINE 10 MG TAB: 10 | 90 days supply | Qty: 90 | Fill #1

## 2018-01-29 MED FILL — PIOGLITAZONE HCL 45 MG TAB: 45 | 90 days supply | Qty: 90 | Fill #1

## 2018-01-29 MED FILL — AMLODIPINE BESYLATE 5 MG TA: 5 | 90 days supply | Qty: 90 | Fill #2

## 2018-02-10 ENCOUNTER — Ambulatory Visit (INDEPENDENT_AMBULATORY_CARE_PROVIDER_SITE_OTHER): Payer: 59 | Admitting: Student in an Organized Health Care Education/Training Program

## 2018-02-10 ENCOUNTER — Encounter: Payer: Self-pay | Admitting: Student in an Organized Health Care Education/Training Program

## 2018-02-10 VITALS — BP 127/80 | HR 89 | Temp 98.6°F | Ht 70.0 in | Wt 284.0 lb

## 2018-02-10 DIAGNOSIS — Z79891 Long term (current) use of opiate analgesic: Secondary | ICD-10-CM

## 2018-02-10 DIAGNOSIS — M47816 Spondylosis without myelopathy or radiculopathy, lumbar region: Secondary | ICD-10-CM

## 2018-02-10 DIAGNOSIS — Z9884 Bariatric surgery status: Secondary | ICD-10-CM

## 2018-02-10 DIAGNOSIS — I10 Essential (primary) hypertension: Secondary | ICD-10-CM | POA: Diagnosis not present

## 2018-02-10 DIAGNOSIS — Z79899 Other long term (current) drug therapy: Secondary | ICD-10-CM

## 2018-02-10 DIAGNOSIS — G8929 Other chronic pain: Secondary | ICD-10-CM | POA: Diagnosis not present

## 2018-02-10 DIAGNOSIS — G4733 Obstructive sleep apnea (adult) (pediatric): Secondary | ICD-10-CM | POA: Diagnosis not present

## 2018-02-10 DIAGNOSIS — Z7984 Long term (current) use of oral hypoglycemic drugs: Secondary | ICD-10-CM

## 2018-02-10 DIAGNOSIS — Z981 Arthrodesis status: Secondary | ICD-10-CM

## 2018-02-10 DIAGNOSIS — Z794 Long term (current) use of insulin: Secondary | ICD-10-CM | POA: Diagnosis not present

## 2018-02-10 DIAGNOSIS — Z7982 Long term (current) use of aspirin: Secondary | ICD-10-CM

## 2018-02-10 DIAGNOSIS — Z6841 Body Mass Index (BMI) 40.0 and over, adult: Secondary | ICD-10-CM

## 2018-02-10 DIAGNOSIS — E1169 Type 2 diabetes mellitus with other specified complication: Secondary | ICD-10-CM | POA: Diagnosis not present

## 2018-02-10 LAB — GLUCOSE, CAPILLARY: GLUCOSE-CAPILLARY: 219 mg/dL — AB (ref 70–99)

## 2018-02-10 LAB — POCT GLYCOSYLATED HEMOGLOBIN (HGB A1C): HEMOGLOBIN A1C: 7.2 % — AB (ref 4.0–5.6)

## 2018-02-10 MED ORDER — HYDROCODONE-ACETAMINOPHEN 7.5-325 MG PO TABS: 1.0000 | ORAL_TABLET | Freq: Every day | ORAL | 0 refills | Status: DC

## 2018-02-10 NOTE — Assessment & Plan Note (Signed)
Well-controlled, hemoglobin A1c 7.2%.  Okay to discontinue Lantus and regular insulin.  Continue with metformin 1000 twice daily and pioglitazone 45 mg daily.  Continue with aspirin and Simvastatin for primary prevention of ischemic disease.  Foot exam okay today.  He has a an ophthalmologist.

## 2018-02-10 NOTE — Assessment & Plan Note (Signed)
Much improved after nasal septoplasty last year, and has had excellent weight loss after gastric sleeve surgery.  He stopped using his home CPAP machine a few weeks ago.  Denies any snoring.  Reports good restful sleep.  Michela Pitcher it was okay to continue to hold on the CPAP machine, but keep it around to keep it in good condition.  It is possible we may need the future.  Can repeat sleep study for definitive answer if we need to.

## 2018-02-10 NOTE — Assessment & Plan Note (Signed)
Patient is lost 46 pounds since gastric sleeve surgery.  Doing very well, fantastic progress.  BMI is down to 40 today.  We talked about increasing exercise, continue with healthful nutrition.  We talked about natural progression and how weight loss will slow, but it is important to continue this positive progress.

## 2018-02-10 NOTE — Patient Instructions (Signed)
Congratulations, you are doing fantastic with your weight loss.  Keep up the good work.  We talked about the importance of increasing exercise.  Continue using your medicines as prescribed.  It is a good idea to stop using insulin as your diabetes is under excellent control.  Follow-up with me in 3 months for your medication refills including the Norco.

## 2018-02-10 NOTE — Assessment & Plan Note (Signed)
Chronic pain due to osteoarthritis of the lumbar spine, status post a anterior discectomy and fusion in 2008 at the L4-L5 space.  Now using hydrocodone 7.5 mg once or twice a day chronically.  Doing really well on this regimen, increasing his functional capacity well.  He is exercising.  Not having any adverse side effects.  No red flags for use disorder.  Plan to check urine tox screen today.  I will refill 59-month supply of hydrocodone, he uses 45 tablets/month.

## 2018-02-10 NOTE — Assessment & Plan Note (Signed)
A little high clinic, but recheck was normal with systolic 902.  Plan to continue with amlodipine 5, losartan 100 milligrams, and hydrochlorthiazide 25 mg daily.

## 2018-02-10 NOTE — Progress Notes (Signed)
   Assessment and Plan:  See Encounters tab for problem-based medical decision making.   __________________________________________________________  HPI:   47 year old male here for follow-up of diabetes.  Patient is doing very well after his gastric sleeve surgery last year.  Has had 46 pounds of total weight loss.  Reports stopping insulin a few weeks ago because he was having symptomatic hypoglycemia.  Says his glucose numbers have been good ever since then.  Denies any chest pain or shortness of breath.  Reports a good exertional capacity.  Reports good compliance with his other medications and denies any adverse side effects.  He is using Norco 1 or 2 tablets/day for chronic lower back pain.  Reports good benefits, says that it is helping him function.  He is doing well at work.  Getting ready for hunting trip with his dad in the coming fall.  Does not have a routine exercise plan right now, we talked about ways to increase his exercise now that his back pain is well controlled, he is lost a lot of weight, and is cooler weather outside.  __________________________________________________________  Problem List: Patient Active Problem List   Diagnosis Date Noted  . Obesity, Class III, BMI 40-49.9 (morbid obesity) ([place]) 11/12/2016    Priority: High  . Type 2 diabetes mellitus with other specified complication ([place]) 79/48/0165    Priority: High  . Hearing loss 11/12/2016    Priority: Medium  . Osteoarthritis of lumbar spine 04/09/2016    Priority: Medium  . Tendinopathy of left rotator cuff 12/30/2015    Priority: Medium  . Obstructive sleep apnea 02/28/2015    Priority: Medium  . Essential hypertension 01/06/2015    Priority: Medium  . Erectile dysfunction 01/06/2015    Priority: Low  . Healthcare maintenance 01/06/2015    Priority: Low    Medications: Reconciled today in Epic __________________________________________________________  Physical Exam:  Vital Signs: Vitals:     02/10/18 0853 02/10/18 0914  BP: (!) 144/69 127/80  Pulse: 91 89  Temp: 98.6 F (37 C)   TempSrc: Oral   SpO2: 97%   Weight: 284 lb (128.8 kg)   Height: 5\' 10"  (1.778 m)     Gen: Well appearing, NAD  Neck: No cervical LAD, No thyromegaly or nodules, No JVD. CV: RRR, no murmurs Pulm: Normal effort, CTA throughout, no wheezing Ext: Warm, no edema, normal joints Psych: Normal affect, not anxious or depressed appearing

## 2018-02-14 LAB — TOXASSURE SELECT,+ANTIDEPR,UR

## 2018-02-17 ENCOUNTER — Ambulatory Visit (INDEPENDENT_AMBULATORY_CARE_PROVIDER_SITE_OTHER): Payer: 59 | Admitting: Internal Medicine

## 2018-02-17 ENCOUNTER — Other Ambulatory Visit: Payer: Self-pay

## 2018-02-17 ENCOUNTER — Other Ambulatory Visit: Payer: Self-pay | Admitting: Student in an Organized Health Care Education/Training Program

## 2018-02-17 DIAGNOSIS — M25561 Pain in right knee: Secondary | ICD-10-CM

## 2018-02-17 MED FILL — HYDROCHLOROTHIAZIDE 25 MG T: 25 | 90 days supply | Qty: 90 | Fill #1

## 2018-02-17 MED FILL — TADALAFIL 20 MG TABS: 20 | 50 days supply | Qty: 10 | Fill #0

## 2018-02-17 MED FILL — HYDROCODON-APAP 7.5-325: 7.5-325 | 30 days supply | Qty: 45 | Fill #0

## 2018-02-17 NOTE — Assessment & Plan Note (Signed)
Right knee pain: Sudden onset x 1 wk prior while stepping backward he fell and twisted the right leg inducing the medial knee area pain. It is 8/10 with use, and 4/10 with rest, improved with tylenol and ibuprofen as well a his Velcro knee compression brace. The pain is persistent and minimally if at all improved in one week. He stated that the pain is focally located in the inner aspect of the right knee just between the knee cap and the absolute inner portion of his knee.  On physical exam there is pain with palpation of the medial knee just anterior to the MCL. Valgus testing induced minor pain but no join laxity noted. Lachmans and posterior draw test were negative, varus testing negative for laxity, no edema or warmth noted with palpation, no significant edema observed, joint appears stable with full ROM and strength 5/5 bilaterally of the knees.  Likely a minor tendon or ligament injury near the MCL. Given the maintained strength, lack of joint instability and lack of concerning physical exam findings, imaging is not currently warranted.  Plan: Patient will continue conservative management with NSADIS and tylenol as per AVS He is to return if symptoms fail to improve over the next 3-6 weeks or if his symptoms worsen.

## 2018-02-17 NOTE — Progress Notes (Signed)
   CC: right knee pain  HPI:[name] is a 47 y.o. male who presents for evaluation of right knee pain. Please see individual problem based A/P for details.  Right knee pain: Sudden onset x 1 wk prior while stepping backward he fell and twisted the right leg inducing the medial knee area pain. It is 8/10 with use, and 4/10 with rest, improved with tylenol and ibuprofen as well a his Velcro knee compression brace. The pain is persistent and minimally if at all improved in one week. He stated that the pain is focally located in the inner aspect of the right knee just between the knee cap and the absolute inner portion of his knee.  On physical exam there is pain with palpation of the medial knee just anterior to the MCL. Valgus testing induced minor pain but no join laxity noted. Lachmans and posterior draw test were negative, varus testing negative for laxity, no edema or warmth noted with palpation, no significant edema observed, joint appears stable with full ROM and strength 5/5 bilaterally of the knees.  Likely a minor tendon or ligament injury near the MCL. Given the maintained strength, lack of joint instability and lack of concerning physical exam findings, imaging is not currently warranted.  Plan: Patient will continue conservative management with NSADIS and tylenol as per AVS He is to return if symptoms fail to improve over the next 3-6 weeks or if his symptoms worsen.  PHQ-9: Based on the patients    Office Visit from 02/17/2018 in [place]  PHQ-9 Total Score  0     score we have decided to monitor.  Past Medical History:  Diagnosis Date  . Anxiety   . Arthritis    lower back, hands  . Deviated septum    nasal turbinate hypertrophy  . Hypertension    states under control with meds., has been on medication since age 51  . Insulin dependent diabetes mellitus (HCC)    type 2 DM  . Morbid obesity ([place])   . Sleep apnea    uses CPAP "most of  the time", per pt.   Review of Systems:  ROS negative except as per HPI.  Physical Exam: Vitals:   02/17/18 1424  BP: 133/68  Pulse: 94  Temp: 98.1 F (36.7 C)  TempSrc: Oral  SpO2: 96%  Weight: 290 lb (131.5 kg)  Height: 6' (1.829 m)   General: A/O x4, in no acute distress, afebrile, nondiaphoretic MSK: See A/P for knee pain  Assessment & Plan:   See Encounters Tab for problem based charting.  Patient discussed with Dr. Evette Doffing

## 2018-02-17 NOTE — Patient Instructions (Signed)
FOLLOW-UP INSTRUCTIONS When: If your symptoms worsen or fail to improve What to bring: All of your medications   I have made not made any changes to your medications today. Please take 600mg  of ibuprofen 2-3 times daily for not more than 10 days. You may also use tylenol 650mg  up to three times daily as needed for pain.   Today we discussed your right knee pain. This may take several weeks to improve. Please refrain form activity that worsens the pain particularly any heavy lifting or twisting motion.   Thank you for your visit to the [facility] today. If you have any questions or concerns please call us at [phone].

## 2018-02-18 MED FILL — SIMVASTATIN 20 MG TABLET: 20 | 90 days supply | Qty: 90 | Fill #3

## 2018-02-18 NOTE — Progress Notes (Signed)
Internal Medicine Clinic Attending  I saw and evaluated the patient.  I personally confirmed the key portions of the history and exam documented by Dr. Harbrecht and I reviewed pertinent patient test results.  The assessment, diagnosis, and plan were formulated together and I agree with the documentation in the resident's note.  

## 2018-03-03 IMAGING — DX DG CHEST 2V
2 series · 2 of 2 positions shown · non-contrast
Comparison: PA and lateral chest x-ray April 01, 2015

CLINICAL DATA: One month of cough and shortness of breath; history
of sleep apnea, diabetes.

EXAM:
CHEST  2 VIEW

[chest pa]
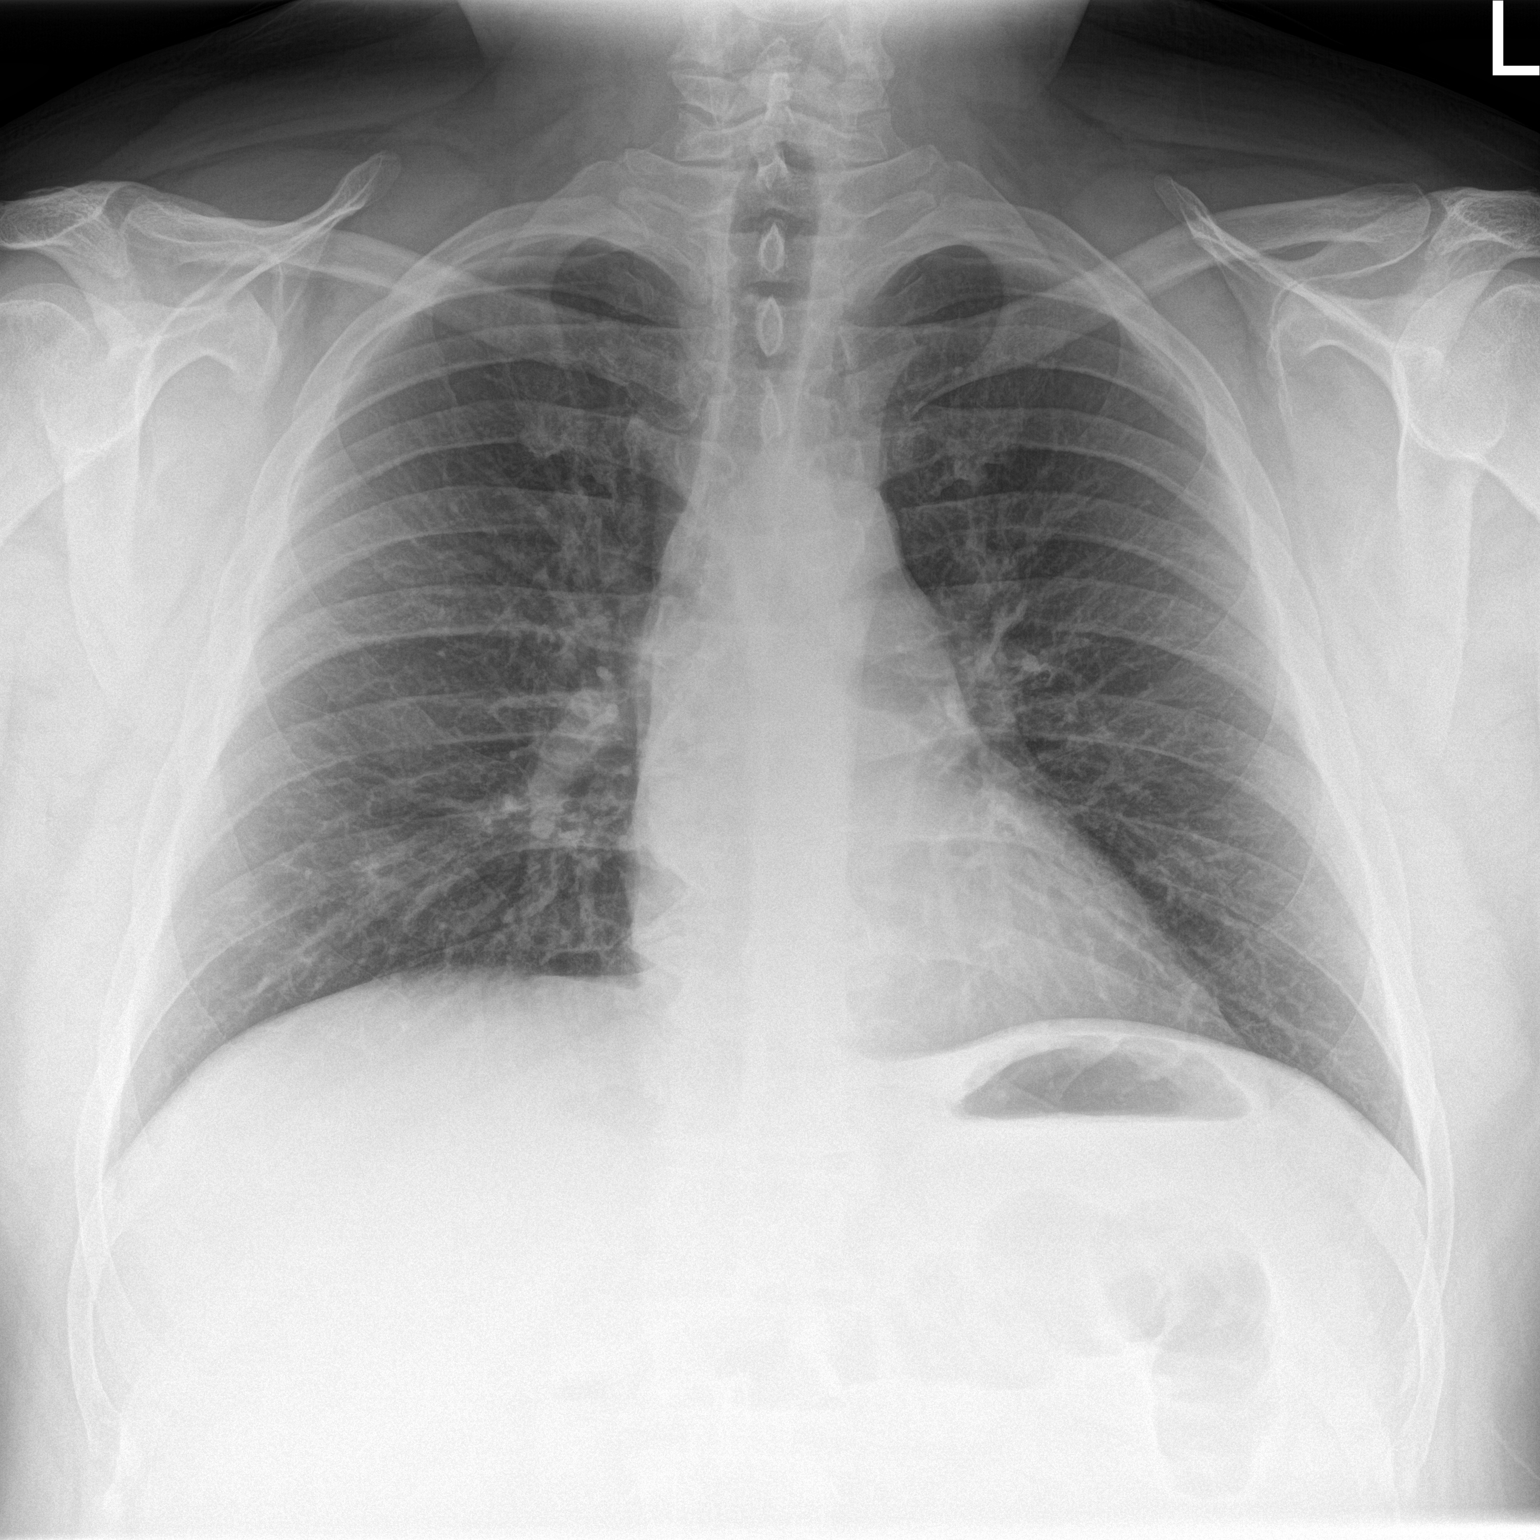

[chest lat]
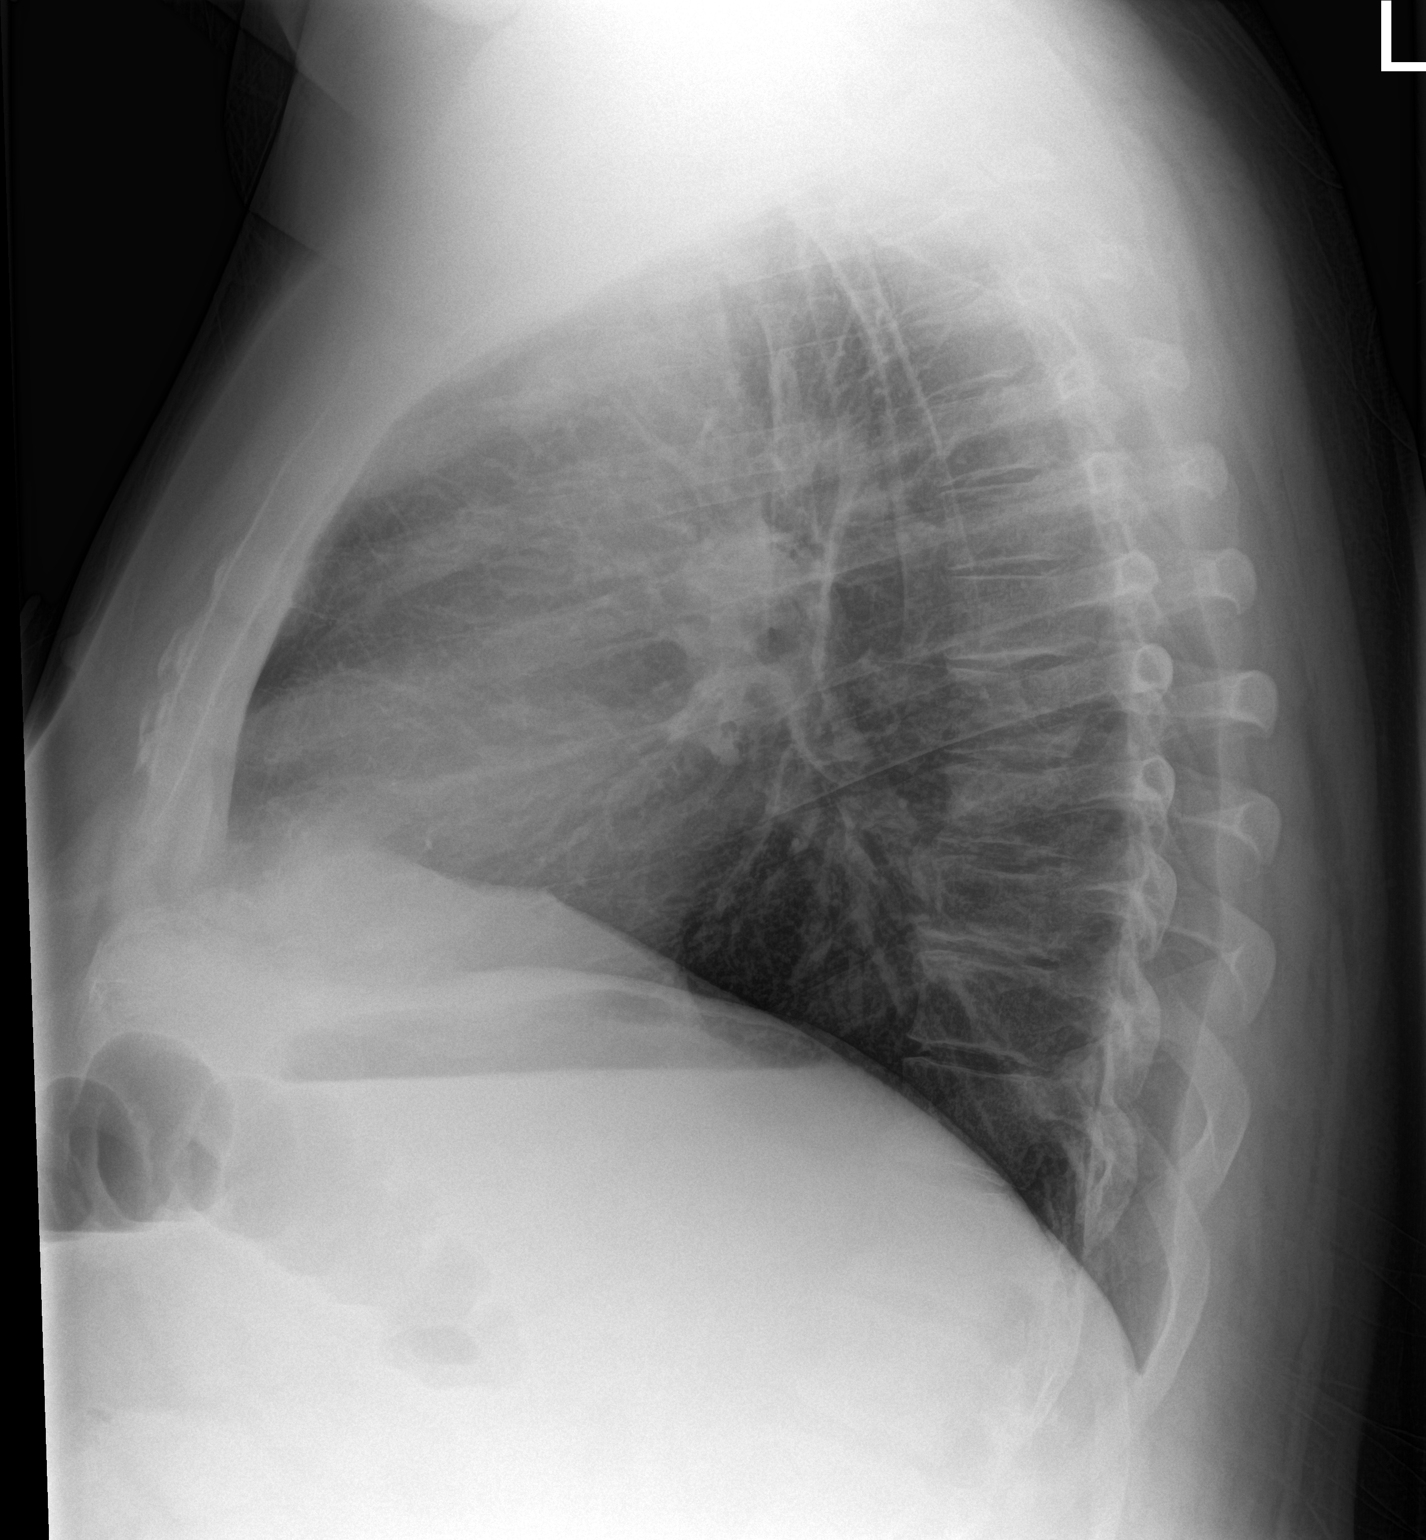

[2 of 2 positions shown; findings below may reference images not displayed]

FINDINGS: The lungs are adequately inflated. There is no focal infiltrate.
There is no pleural effusion. The heart and pulmonary vascularity
are normal. The mediastinum is normal in width. The bony thorax
exhibits no acute abnormality.
IMPRESSION: There is no active cardiopulmonary disease.

## 2018-03-19 MED FILL — HYDROCODON-APAP 7.5-325: 7.5-325 | 30 days supply | Qty: 45 | Fill #0

## 2018-04-01 MED FILL — LOSARTAN POTASSIUM 100 MG T: 100 | 90 days supply | Qty: 90 | Fill #3

## 2018-04-17 MED FILL — DULoxetine HCL 60 MG CPEP: 60 | 90 days supply | Qty: 90 | Fill #2

## 2018-04-17 MED FILL — TADALAFIL 20 MG TABS: 20 | 50 days supply | Qty: 10 | Fill #1

## 2018-04-18 MED FILL — HYDROCODON-APAP 7.5-325: 7.5-325 | 30 days supply | Qty: 45 | Fill #0

## 2018-05-05 MED FILL — HYDROCHLOROTHIAZIDE 25 MG T: 25 | 90 days supply | Qty: 90 | Fill #2

## 2018-05-05 MED FILL — AMLODIPINE BESYLATE 5 MG TA: 5 | 90 days supply | Qty: 90 | Fill #3

## 2018-05-05 MED FILL — metFORMIN HCL 1000 MG TABS: 1000 | 90 days supply | Qty: 180 | Fill #4

## 2018-05-05 MED FILL — PIOGLITAZONE HCL 45 MG TABS: 45 | 90 days supply | Qty: 90 | Fill #2

## 2018-05-15 ENCOUNTER — Encounter: Payer: Self-pay | Admitting: *Deleted

## 2018-05-19 DIAGNOSIS — G4733 Obstructive sleep apnea (adult) (pediatric): Secondary | ICD-10-CM | POA: Diagnosis not present

## 2018-06-03 ENCOUNTER — Other Ambulatory Visit: Payer: Self-pay | Admitting: Student in an Organized Health Care Education/Training Program

## 2018-06-03 DIAGNOSIS — M47816 Spondylosis without myelopathy or radiculopathy, lumbar region: Secondary | ICD-10-CM

## 2018-06-03 MED FILL — HYDROCODON-APAP 7.5-325: 7.5-325 | 30 days supply | Qty: 45 | Fill #0

## 2018-06-03 MED FILL — CYCLOBENZAPRINE 10 MG TAB: 10 | 90 days supply | Qty: 90 | Fill #2

## 2018-06-03 MED FILL — SIMVASTATIN 20 MG TABLET: 20 | 90 days supply | Qty: 90 | Fill #0 | Status: TO

## 2018-06-03 NOTE — Telephone Encounter (Signed)
Last rx written 02/10/18 Last OV 02/17/18 w/Dr Harbrecht UDS  02/10/18

## 2018-06-06 MED FILL — FREESTYLE LITE TEST STRIP: 90 days supply | Qty: 400 | Fill #1

## 2018-06-13 ENCOUNTER — Other Ambulatory Visit: Payer: Self-pay | Admitting: Student in an Organized Health Care Education/Training Program

## 2018-06-16 MED FILL — TADALAFIL 20 MG TABS: 20 | 50 days supply | Qty: 10 | Fill #0 | Status: TO

## 2018-07-04 ENCOUNTER — Other Ambulatory Visit: Payer: Self-pay | Admitting: Student in an Organized Health Care Education/Training Program

## 2018-07-04 DIAGNOSIS — M47816 Spondylosis without myelopathy or radiculopathy, lumbar region: Secondary | ICD-10-CM

## 2018-07-04 MED FILL — HYDROCODON-APAP 7.5-325: 7.5-325 | 30 days supply | Qty: 45 | Fill #0

## 2018-07-04 MED FILL — LOSARTAN POTASSIUM 100 MG T: 100 | 90 days supply | Qty: 90 | Fill #0

## 2018-07-07 ENCOUNTER — Encounter (HOSPITAL_COMMUNITY): Payer: Self-pay

## 2018-08-01 ENCOUNTER — Other Ambulatory Visit: Payer: Self-pay | Admitting: Student in an Organized Health Care Education/Training Program

## 2018-08-01 DIAGNOSIS — I10 Essential (primary) hypertension: Secondary | ICD-10-CM

## 2018-08-01 MED FILL — PIOGLITAZONE HCL 45 MG TAB: 45 | 90 days supply | Qty: 90 | Fill #0

## 2018-08-01 MED FILL — DULOXETINE HCL 60 MG CPEP: 60 | 90 days supply | Qty: 90 | Fill #0

## 2018-08-01 MED FILL — HYDROCHLOROTHIAZIDE 25 MG T: 25 | 90 days supply | Qty: 90 | Fill #0

## 2018-08-01 MED FILL — SIMVASTATIN 20 MG TABLET: 20 | 90 days supply | Qty: 90 | Fill #0

## 2018-08-02 ENCOUNTER — Other Ambulatory Visit: Payer: Self-pay | Admitting: Student in an Organized Health Care Education/Training Program

## 2018-08-02 DIAGNOSIS — I10 Essential (primary) hypertension: Secondary | ICD-10-CM

## 2018-08-04 MED FILL — AMLODIPINE BESYLATE 5 MG TA: 5 | 90 days supply | Qty: 90 | Fill #0

## 2018-08-14 ENCOUNTER — Other Ambulatory Visit: Payer: Self-pay | Admitting: Student in an Organized Health Care Education/Training Program

## 2018-08-14 DIAGNOSIS — M47816 Spondylosis without myelopathy or radiculopathy, lumbar region: Secondary | ICD-10-CM

## 2018-08-14 MED FILL — HYDROCODON-APAP 7.5-325: 7.5-325 | 30 days supply | Qty: 45 | Fill #0

## 2018-08-14 MED FILL — TADALAFIL 20 MG TABS: 20 | 50 days supply | Qty: 10 | Fill #0

## 2018-08-15 ENCOUNTER — Other Ambulatory Visit: Payer: Self-pay | Admitting: Student in an Organized Health Care Education/Training Program

## 2018-08-15 MED FILL — metFORMIN HCL 1000 MG TABS: 1000 | 45 days supply | Qty: 90 | Fill #0

## 2018-09-16 MED FILL — CYCLOBENZAPRINE 10 MG TAB: 10 | 90 days supply | Qty: 90 | Fill #3

## 2018-10-03 MED FILL — metFORMIN HCL 1000 MG TABS: 1000 | 45 days supply | Qty: 90 | Fill #0

## 2018-10-03 MED FILL — LOSARTAN POTASSIUM 100 MG T: 100 | 90 days supply | Qty: 90 | Fill #1

## 2018-10-07 ENCOUNTER — Other Ambulatory Visit: Payer: Self-pay | Admitting: Student in an Organized Health Care Education/Training Program

## 2018-10-07 MED FILL — TADALAFIL 20 MG TABS: 20 | 50 days supply | Qty: 10 | Fill #0

## 2018-10-20 ENCOUNTER — Other Ambulatory Visit: Payer: Self-pay | Admitting: Internal Medicine

## 2018-10-20 DIAGNOSIS — M47816 Spondylosis without myelopathy or radiculopathy, lumbar region: Secondary | ICD-10-CM

## 2018-10-20 MED FILL — HYDROCODON-APAP 7.5-325: 7.5-325 | 23 days supply | Qty: 45 | Fill #0

## 2018-11-04 ENCOUNTER — Other Ambulatory Visit: Payer: Self-pay | Admitting: Student in an Organized Health Care Education/Training Program

## 2018-11-04 MED FILL — AMLODIPINE BESYLATE 5 MG TA: 5 | 90 days supply | Qty: 90 | Fill #0

## 2018-11-04 MED FILL — HYDROCHLOROTHIAZIDE 25 MG T: 25 | 90 days supply | Qty: 90 | Fill #0

## 2018-11-04 MED FILL — DULoxetine HCL 60 MG CPEP: 60 | 90 days supply | Qty: 90 | Fill #0

## 2018-11-07 ENCOUNTER — Other Ambulatory Visit: Payer: Self-pay | Admitting: Student in an Organized Health Care Education/Training Program

## 2018-11-07 DIAGNOSIS — E119 Type 2 diabetes mellitus without complications: Secondary | ICD-10-CM

## 2018-11-07 MED FILL — PIOGLITAZONE HCL 45 MG TAB: 45 | 90 days supply | Qty: 90 | Fill #0

## 2018-11-07 NOTE — Telephone Encounter (Signed)
Next appt scheduled 7/20 with PCP.

## 2018-11-10 ENCOUNTER — Other Ambulatory Visit: Payer: Self-pay

## 2018-11-10 ENCOUNTER — Ambulatory Visit (HOSPITAL_COMMUNITY)
Admission: RE | Admit: 2018-11-10 | Discharge: 2018-11-10 | Disposition: A | Discharge status: Home / Self Care | Payer: 59 | Source: Ambulatory Visit | Attending: Student in an Organized Health Care Education/Training Program | Admitting: Student in an Organized Health Care Education/Training Program

## 2018-11-10 ENCOUNTER — Ambulatory Visit (INDEPENDENT_AMBULATORY_CARE_PROVIDER_SITE_OTHER): Payer: 59 | Admitting: Student in an Organized Health Care Education/Training Program

## 2018-11-10 ENCOUNTER — Encounter: Payer: Self-pay | Admitting: Student in an Organized Health Care Education/Training Program

## 2018-11-10 VITALS — BP 146/78 | HR 79 | Temp 98.2°F | Wt 268.9 lb

## 2018-11-10 DIAGNOSIS — Z0000 Encounter for general adult medical examination without abnormal findings: Secondary | ICD-10-CM

## 2018-11-10 DIAGNOSIS — Z6836 Body mass index (BMI) 36.0-36.9, adult: Secondary | ICD-10-CM | POA: Diagnosis not present

## 2018-11-10 DIAGNOSIS — M19041 Primary osteoarthritis, right hand: Secondary | ICD-10-CM | POA: Diagnosis not present

## 2018-11-10 DIAGNOSIS — Z7982 Long term (current) use of aspirin: Secondary | ICD-10-CM

## 2018-11-10 DIAGNOSIS — G8929 Other chronic pain: Secondary | ICD-10-CM

## 2018-11-10 DIAGNOSIS — I10 Essential (primary) hypertension: Secondary | ICD-10-CM

## 2018-11-10 DIAGNOSIS — R748 Abnormal levels of other serum enzymes: Secondary | ICD-10-CM

## 2018-11-10 DIAGNOSIS — M659 Synovitis and tenosynovitis, unspecified: Secondary | ICD-10-CM | POA: Insufficient documentation

## 2018-11-10 DIAGNOSIS — Z794 Long term (current) use of insulin: Secondary | ICD-10-CM

## 2018-11-10 DIAGNOSIS — Z9884 Bariatric surgery status: Secondary | ICD-10-CM

## 2018-11-10 DIAGNOSIS — E1169 Type 2 diabetes mellitus with other specified complication: Secondary | ICD-10-CM | POA: Diagnosis not present

## 2018-11-10 DIAGNOSIS — Z7984 Long term (current) use of oral hypoglycemic drugs: Secondary | ICD-10-CM

## 2018-11-10 DIAGNOSIS — M47816 Spondylosis without myelopathy or radiculopathy, lumbar region: Secondary | ICD-10-CM | POA: Diagnosis not present

## 2018-11-10 DIAGNOSIS — Z79891 Long term (current) use of opiate analgesic: Secondary | ICD-10-CM

## 2018-11-10 DIAGNOSIS — Z79899 Other long term (current) drug therapy: Secondary | ICD-10-CM

## 2018-11-10 DIAGNOSIS — Z981 Arthrodesis status: Secondary | ICD-10-CM

## 2018-11-10 DIAGNOSIS — M1811 Unilateral primary osteoarthritis of first carpometacarpal joint, right hand: Secondary | ICD-10-CM | POA: Diagnosis not present

## 2018-11-10 DIAGNOSIS — M069 Rheumatoid arthritis, unspecified: Secondary | ICD-10-CM | POA: Insufficient documentation

## 2018-11-10 DIAGNOSIS — M19042 Primary osteoarthritis, left hand: Secondary | ICD-10-CM

## 2018-11-10 LAB — POCT GLYCOSYLATED HEMOGLOBIN (HGB A1C): Hemoglobin A1C: 6.7 % — AB (ref 4.0–5.6)

## 2018-11-10 LAB — GLUCOSE, CAPILLARY: Glucose-Capillary: 142 mg/dL — ABNORMAL HIGH (ref 70–99)

## 2018-11-10 MED FILL — metFORMIN HCL 1000 MG TABS: 1000 | 45 days supply | Qty: 90 | Fill #1

## 2018-11-10 NOTE — Progress Notes (Signed)
   Assessment and Plan:  See Encounters tab for problem-based medical decision making.   __________________________________________________________  HPI:   48 year old man here for follow-up of diabetes and hypertension.  Is been quite a while since I seen him because of the COVID-19 restrictions.  Reports doing very well at home.  He had a period where he was able to work from home which was nice.  Has been exercising about 3 times a week with walking during his lunch break.  Reports persistent weight loss since his gastric sleeve surgery in 2018.  He is complaining of increasing pain and stiffness of the right hand especially around the third MCP joint.  Denies any chest pain or shortness of breath with exercise.  He reports good compliance with his medications without adverse side effects.  Reports good benefit to Norco in helping his chronic osteoarthritis related pain and improving his functional status.  Lives with his wife who is a Marine scientist at home.  Currently she is being tested for COVID-19, that test is pending, her symptoms seem pretty low risk.  Patient feels fine with no fevers, no chills, cough, no sore throats, no sinus congestion, no anosmia.  He reports taking the COVID-19 restrictions and mask requirement very seriously.  __________________________________________________________  Problem List: Patient Active Problem List   Diagnosis Date Noted  . Obesity, Class III, BMI 40-49.9 (morbid obesity) ([place]) 11/12/2016    Priority: High  . Type 2 diabetes mellitus with other specified complication ([place]) 78/24/2353    Priority: High  . Hearing loss 11/12/2016    Priority: Medium  . Osteoarthritis of lumbar spine 04/09/2016    Priority: Medium  . Tendinopathy of left rotator cuff 12/30/2015    Priority: Medium  . Obstructive sleep apnea 02/28/2015    Priority: Medium  . Essential hypertension 01/06/2015    Priority: Medium  . Erectile dysfunction 01/06/2015    Priority: Low  .  Healthcare maintenance 01/06/2015    Priority: Low  . Synovitis of hand 11/10/2018    Medications: Reconciled today in Epic __________________________________________________________  Physical Exam:  Vital Signs: Vitals:   11/10/18 1026  BP: (!) 146/78  Pulse: 79  Temp: 98.2 F (36.8 C)  TempSrc: Oral  SpO2: 95%  Weight: 268 lb 14.4 oz (122 kg)    Gen: Well appearing, NAD CV: RRR, no murmurs Pulm: Normal effort, CTA throughout, no wheezing  Ext: Right hand has swelling and redness over the third MCP joint, there is synovitis of the joint.  No Bouchard's or Heberden's nodes. Skin: No atypical appearing moles. No rashes

## 2018-11-10 NOTE — Assessment & Plan Note (Signed)
Excellent control.  A1c 6.7%.  Plan to continue with metformin and pioglitazone.  Plan to continue with aspirin and simvastatin for primary prevention of ischemic vascular disease.  He is overdue for an eye exam.

## 2018-11-10 NOTE — Assessment & Plan Note (Signed)
Patient is 48 years old, average risk for colon cancer.  No family history of colon cancer.  We discussed colon cancer screening modalities.  Plan is to check a FIT test.

## 2018-11-10 NOTE — Assessment & Plan Note (Signed)
Blood pressure in an acceptable range.  I asked him to take more readings at home.  And is to continue with amlodipine 5, HCTZ 25, losartan 100.  I anticipate his blood pressure control will improve as he loses more weight.  We will continue to monitor.  Check renal function today.

## 2018-11-10 NOTE — Assessment & Plan Note (Signed)
Stable chronic pain with osteoarthritis of his lumbar spine status post a L4-5 anterior discectomy and fusion in 2008.  He uses Norco 7.5 mg 1-2 daily to improve functional status.  Reports good benefit.  Denies any adverse side effects.  We will continue to refill as needed.

## 2018-11-10 NOTE — Assessment & Plan Note (Signed)
Patient reports a history of chronic osteoarthritis of his right hands along with stiffness.  On exam he has synovitis of the third MCP joints.  We did an x-ray of his right hand 4 years ago for first MCP pain which did not show any erosive disease.  He reports a history of being ruled out for rheumatoid arthritis but this was many years ago with his physician up [place].  He reports stiffness all over his body all the time, no other symptoms to suggest inflammatory arthritis besides the obvious synovitis on exam.  He is right-hand dominant.  Plan is to repeat an x-ray of the right hand to look for erosive changes.  Also will check rheumatoid factor and anti-CCP along with inflammatory markers

## 2018-11-10 NOTE — Assessment & Plan Note (Signed)
Continues to do very well after his gastric sleeve surgery.  Weight today 268 pounds, BMI of 36.  Down 14 pounds since I last saw him in October.  Max weight was 321 pounds prior to the surgery.  He is exercising 3 times a week.  We talked about continuing with healthy diet and maintaining a good schedule of exercise.

## 2018-11-10 NOTE — Patient Instructions (Signed)
Today we talked about your diabetes.  Your A1c is 6.7% which is excellent.  Please continue your current medications.  Continue to exercise at least 150 minutes each week.  Continue to eat a diet that is healthy with fruits, vegetables, and whole grains.  We talked about your right hand arthritis.  I am going to get an x-ray of your hand to make sure there is no changes to the bone.  I will also check blood work to make sure you do not have a autoimmune form of arthritis.  We talked about your blood pressure which looks fine.  I am going to check your blood work today to make sure your kidney function, cholesterol, are in good range.  We talked about colon cancer screening.  You are at average risk.  We will do a home stool test.  Please follow the instructions on the test for collection and return.  I will send you a letter with the results.

## 2018-11-11 ENCOUNTER — Telehealth: Payer: Self-pay | Admitting: Student in an Organized Health Care Education/Training Program

## 2018-11-11 DIAGNOSIS — M659 Synovitis and tenosynovitis, unspecified: Secondary | ICD-10-CM

## 2018-11-11 NOTE — Telephone Encounter (Signed)
Patient with several weeks of right third MCP pain. There is synovitis on exam at the third MCP. Xray looks ok with no erosive changes, some joint degeneration at 2nd and 3rd MCP. Labs show normal inflammatory markers, but an elevated Rhematoid Factor at 73 (upper limit of normal is 14).   I spoke with patient over the phone about these results. I don't think he necessarily meets all the diagnostic criteria right now for Rheumatoid Arthritis. Plan is to refer to rheumatology to ensure we have ruled out all other inflammatory arthritis and question if he meets criteria for RA. Patient understands, and is agreeable.

## 2018-11-12 LAB — CMP14 + ANION GAP
ALT: 38 IU/L (ref 0–44)
AST: 38 IU/L (ref 0–40)
Albumin/Globulin Ratio: 2.6 — ABNORMAL HIGH (ref 1.2–2.2)
Albumin: 4.7 g/dL (ref 4.0–5.0)
Alkaline Phosphatase: 87 IU/L (ref 39–117)
Anion Gap: 21 mmol/L — ABNORMAL HIGH (ref 10.0–18.0)
BUN/Creatinine Ratio: 15 (ref 9–20)
BUN: 14 mg/dL (ref 6–24)
Bilirubin Total: 0.3 mg/dL (ref 0.0–1.2)
CO2: 24 mmol/L (ref 20–29)
Calcium: 9.7 mg/dL (ref 8.7–10.2)
Chloride: 97 mmol/L (ref 96–106)
Creatinine, Ser: 0.91 mg/dL (ref 0.76–1.27)
GFR calc Af Amer: 113 mL/min/{1.73_m2} (ref >59)
GFR calc non Af Amer: 98 mL/min/{1.73_m2} (ref >59)
Globulin, Total: 1.8 g/dL (ref 1.5–4.5)
Glucose: 136 mg/dL — ABNORMAL HIGH (ref 65–99)
Potassium: 3.8 mmol/L (ref 3.5–5.2)
Sodium: 142 mmol/L (ref 134–144)
Total Protein: 6.5 g/dL (ref 6.0–8.5)

## 2018-11-12 LAB — LIPID PANEL
Chol/HDL Ratio: 2 ratio (ref 0.0–5.0)
Cholesterol, Total: 177 mg/dL (ref 100–199)
HDL: 87 mg/dL (ref >39)
LDL Calculated: 57 mg/dL (ref 0–99)
Triglycerides: 165 mg/dL — ABNORMAL HIGH (ref 0–149)
VLDL Cholesterol Cal: 33 mg/dL (ref 5–40)

## 2018-11-12 LAB — C-REACTIVE PROTEIN: CRP: <1 mg/L (ref 0–10)

## 2018-11-12 LAB — CYCLIC CITRUL PEPTIDE ANTIBODY, IGG/IGA: Cyclic Citrullin Peptide Ab: 4 units (ref 0–19)

## 2018-11-12 LAB — RHEUMATOID FACTOR: Rhuematoid fact SerPl-aCnc: 73.4 IU/mL — ABNORMAL HIGH (ref 0.0–13.9)

## 2018-11-12 LAB — SEDIMENTATION RATE: Sed Rate: 8 mm/hr (ref 0–30)

## 2018-11-13 ENCOUNTER — Other Ambulatory Visit: Payer: Self-pay | Admitting: Student in an Organized Health Care Education/Training Program

## 2018-11-13 DIAGNOSIS — M47816 Spondylosis without myelopathy or radiculopathy, lumbar region: Secondary | ICD-10-CM

## 2018-11-13 NOTE — Telephone Encounter (Signed)
Last office visit: 11/10/18 Last UDS: 02/10/2018 Last Refill: last written on 10/20/18 Next appt: 02/09/19

## 2018-11-19 MED FILL — HYDROCODON-APAP 7.5-325: 7.5-325 | 23 days supply | Qty: 45 | Fill #0

## 2018-12-09 ENCOUNTER — Other Ambulatory Visit: Payer: Self-pay | Admitting: Student in an Organized Health Care Education/Training Program

## 2018-12-09 MED FILL — SIMVASTATIN 20 MG TABLET: 20 | 90 days supply | Qty: 90 | Fill #0

## 2018-12-09 MED FILL — TADALAFIL 20 MG TABS: 20 | 50 days supply | Qty: 10 | Fill #0

## 2018-12-11 NOTE — Progress Notes (Signed)
Office Visit Note  Patient: Adam [name]             Date of Birth: Aug 21, 1970           MRN: [id number]             PCP: Axel Filler, MD Referring: Axel Filler Visit Date: 12/25/2018 Occupation: Lobbyist  Subjective:  Pain in both hands.   History of Present Illness: Adam [name] is a 48 y.o. male seen in consultation per request of Dr.Vincent.  According to patient he has had history of arthritis for many years.  He states he has had lower back pain for multiple years and he underwent L5-S1 fusion several years ago.  He had improvement in his symptoms but he still have some ongoing discomfort.  He has some stiffness in his C-spine.  He states in 1999 he was electrocuted and injured his left shoulder since then he has had 2 surgeries on his left shoulder.  He recalls for the last 8 years he has had pain and swelling in his bilateral hands more prominent in his right hand.  The swelling is persistent.  He states he has even nocturnal pain in his hands.  He does not recall any history of injury.  He states he lived with the symptoms for many years and did not complain but recently when he went for his visit he mentioned it to his PCP who did x-rays and referred him here for further evaluation.  He is also has been experiencing pain and discomfort in his knee joints for the last 5 years.  He states his right knee joint swell.  He recalls injuring his left ankle joint about 6 to 7 years ago and he states his left ankle joint is still swollen.  None of the other joints are painful or swollen.  He denies any history of psoriasis,, GI symptoms.  There is no family history of autoimmune disease.  He believes that his father and paternal grandfather had osteoarthritis.  Activities of Daily Living:  Patient reports morning stiffness for several hours.   Patient Reports nocturnal pain.  Difficulty dressing/grooming: Denies Difficulty climbing stairs: Denies  Difficulty getting out of chair: Denies Difficulty using hands for taps, buttons, cutlery, and/or writing: Reports  Review of Systems  Constitutional: Positive for fatigue. Negative for night sweats.  HENT: Positive for mouth dryness. Negative for mouth sores and nose dryness.   Eyes: Negative for redness, itching and dryness.  Respiratory: Negative for shortness of breath, wheezing and difficulty breathing.   Cardiovascular: Negative for chest pain, palpitations, hypertension, irregular heartbeat and swelling in legs/feet.  Gastrointestinal: Negative for blood in stool, constipation and diarrhea.  Endocrine: Negative for increased urination.  Genitourinary: Negative for difficulty urinating and painful urination.  Musculoskeletal: Positive for arthralgias, joint pain, joint swelling and morning stiffness. Negative for myalgias, muscle weakness, muscle tenderness and myalgias.  Skin: Negative for color change, rash, hair loss, nodules/bumps, redness, skin tightness, ulcers and sensitivity to sunlight.  Allergic/Immunologic: Negative for susceptible to infections.  Neurological: Negative for dizziness, fainting, headaches, memory loss, night sweats and weakness.  Hematological: Negative for bruising/bleeding tendency and swollen glands.  Psychiatric/Behavioral: Positive for sleep disturbance. Negative for depressed mood and confusion. The patient is not nervous/anxious.     PMFS History:  Patient Active Problem List   Diagnosis Date Noted  . Synovitis of hand 11/10/2018  . Hearing loss 11/12/2016  . Obesity, Class III, BMI 40-49.9 (morbid obesity) ([place]) 11/12/2016  .  Osteoarthritis of lumbar spine 04/09/2016  . Tendinopathy of left rotator cuff 12/30/2015  . Obstructive sleep apnea 02/28/2015  . Type 2 diabetes mellitus with other specified complication ([place]) 54/62/7035  . Erectile dysfunction 01/06/2015  . Healthcare maintenance 01/06/2015  . Essential hypertension 01/06/2015     Past Medical History:  Diagnosis Date  . Anxiety   . Arthritis    lower back, hands  . Deviated septum    nasal turbinate hypertrophy  . Hypertension    states under control with meds., has been on medication since age 50  . Insulin dependent diabetes mellitus (HCC)    type 2 DM  . Morbid obesity ([place])   . Sleep apnea    uses CPAP "most of the time", per pt.    Family History  Problem Relation Age of Onset  . Hyperlipidemia Mother   . Aneurysm Mother   . Hypertension Mother   . Diabetes Father   . Hyperlipidemia Father   . Hypertension Father   . Healthy Daughter   . Healthy Daughter    Past Surgical History:  Procedure Laterality Date  . CARPAL TUNNEL RELEASE Right 07/2014  . LAPAROSCOPIC GASTRIC SLEEVE RESECTION     Dr. Excell Seltzer 09-09-17  . LAPAROSCOPIC GASTRIC SLEEVE RESECTION N/A 09/09/2017   Procedure: LAPAROSCOPIC GASTRIC SLEEVE RESECTION  AND ERAS PATHWAY;  Surgeon: Excell Seltzer, MD;  Location: [facility];  Service: General;  Laterality: N/A;  . LUMBAR FUSION    . NASAL SEPTOPLASTY W/ TURBINOPLASTY Bilateral 03/22/2017   Procedure: NASAL SEPTOPLASTY WITH  BILATERAL INFERIOR   TURBINATE REDUCTION;  Surgeon: Jerrell Belfast, MD;  Location: [facility];  Service: ENT;  Laterality: Bilateral;  . SHOULDER ARTHROSCOPY Left    x 2  . TONSILLECTOMY AND ADENOIDECTOMY    . TYMPANOSTOMY TUBE PLACEMENT Bilateral   . WISDOM TOOTH EXTRACTION     Social History   Social History Narrative  . Not on file   Immunization History  Administered Date(s) Administered  . Influenza,inj,Quad PF,6+ Mos 01/06/2015, 01/07/2017  . Influenza-Unspecified 03/10/2016  . Pneumococcal Polysaccharide-23 01/06/2015  . Tdap 12/09/2010     Objective: Vital Signs: BP (!) 154/88 (BP Location: Right Arm, Patient Position: Sitting, Cuff Size: Normal)   Pulse 77   Resp 15   Ht 6' (1.829 m)   Wt 267 lb 3.2 oz (121.2 kg)   BMI 36.24 kg/m    Physical Exam Vitals signs and nursing note reviewed.   Constitutional:      Appearance: He is well-developed.  HENT:     Head: Normocephalic and atraumatic.  Eyes:     Conjunctiva/sclera: Conjunctivae normal.     Pupils: Pupils are equal, round, and reactive to light.  Neck:     Musculoskeletal: Normal range of motion and neck supple.  Cardiovascular:     Rate and Rhythm: Normal rate and regular rhythm.     Heart sounds: Normal heart sounds.  Pulmonary:     Effort: Pulmonary effort is normal.     Breath sounds: Normal breath sounds.  Abdominal:     General: Bowel sounds are normal.     Palpations: Abdomen is soft.  Skin:    General: Skin is warm and dry.     Capillary Refill: Capillary refill takes less than 2 seconds.  Neurological:     Mental Status: He is alert and oriented to person, place, and time.  Psychiatric:        Behavior: Behavior normal.      Musculoskeletal Exam:  C-spine thoracic and lumbar spine were in good range of motion although he had discomfort range of motion of his lumbar spine.  He had painful range of motion of his left shoulder.  Elbow joints and wrist joints with good range of motion.  He has synovitis over bilateral second and third MCP joints.  Hip joints with good range of motion.  He is crepitus in his bilateral knee joints.  He is some swelling over left ankle joint.  He had mild tenderness across MTPs but no synovitis was noted.  CDAI Exam: CDAI Score: 13  Patient Global: 5 mm; Provider Global: 5 mm Swollen: 5 ; Tender: 9  Joint Exam      Right  Left  Wrist   Tender   Tender  MCP 2  Swollen Tender  Swollen Tender  MCP 3  Swollen Tender  Swollen Tender  Knee   Tender   Tender  Ankle     Swollen Tender     Investigation: Findings:  11/10/18: RF 73.4, Sed rate 8, CCP 4, CRP<1  Component     Latest Ref Rng & Units 11/10/2018  RA Latex Turbid.     0.0 - 13.9 IU/mL 73.4 (H)  Sed Rate     0 - 30 mm/hr 8  Cyclic Citrullin Peptide Ab     0 - 19 units 4  CRP     0 - 10 mg/L <1   Imaging:  Xr Ankle 2 Views Left  Result Date: 12/25/2018 Anterior and posterior spurring of the tibia was noted.  No significant tibial talar joint space narrowing was noted. Impression: Unremarkable x-ray of the ankle joint.  Xr Foot 2 Views Left  Result Date: 12/25/2018 No MTP joint narrowing or erosive changes were noted.  Mild PIP and DIP joint space narrowing was noted.  Small posterior calcaneal spur was noted. Impression: These findings are consistent with osteoarthritis of the foot.  Xr Foot 2 Views Right  Result Date: 12/25/2018 First MTP, PIP and DIP narrowing was noted.  None of the other MTPs showed narrowing or erosive changes.  No intertarsal joint space narrowing was noted.  Posterior calcaneal spur was noted. Impression: These findings are consistent with osteoarthritis of the foot.  Xr Hand 2 View Left  Result Date: 12/25/2018 Second and third MCP joint narrowing was noted.  PIP and DIP narrowing was noted.  CMC narrowing was noted.  No intercarpal radiocarpal joint space narrowing was noted. Impression: These findings are consistent with rheumatoid arthritis and osteoarthritis overlap.  Xr Knee 3 View Right  Result Date: 12/25/2018 No significant medial lateral compartment narrowing was noted.  Mild patellofemoral narrowing was noted. Impression: Mild chondromalacia patella was noted.   Recent Labs: Lab Results  Component Value Date   WBC 9.5 09/10/2017   HGB 12.8 (L) 09/10/2017   PLT 132 (L) 09/10/2017   NA 142 11/10/2018   K 3.8 11/10/2018   CL 97 11/10/2018   CO2 24 11/10/2018   GLUCOSE 136 (H) 11/10/2018   BUN 14 11/10/2018   CREATININE 0.91 11/10/2018   BILITOT 0.3 11/10/2018   ALKPHOS 87 11/10/2018   AST 38 11/10/2018   ALT 38 11/10/2018   PROT 6.5 11/10/2018   ALBUMIN 4.7 11/10/2018   CALCIUM 9.7 11/10/2018   GFRAA 113 11/10/2018    Speciality Comments: No specialty comments available.  Procedures:  No procedures performed Allergies: Patient has no known  allergies.   Assessment / Plan:     Visit Diagnoses: Pain  in both hands -patient has synovitis on palpation over bilateral second and third MCP joints.  He has symptoms going on for the last 8 years.  The x-ray of the left hand showed second and third MCP narrowing.  The report from the previous x-ray of his right hand also shows second and third MCP narrowing.  He has positive rheumatoid factor.  These findings are consistent with rheumatoid arthritis.  I will obtain labs today and then will discuss treatment options at the follow-up visit.  11/10/18: RF 73.4, Sed rate 8, CCP 4, CRP<1 - Plan: XR Hand 2 View Left  Chronic pain of both knees -he has chronic right knee pain.  No warmth swelling effusion was noted.  X-ray findings were consistent with chondromalacia patella.  Plan: XR KNEE 3 VIEW RIGHT  Chronic pain of left ankle -patient has swelling of his left ankle.  He states related to a chronic injury.  Ankle joint x-ray was unremarkable.  Plan: XR Ankle 2 Views Left  Pain in both feet -he has some tenderness across MTPs.  No MTP joint narrowing or erosive changes were noted.  Plan: XR Foot 2 Views Right, XR Foot 2 Views Left  High risk medication use - Plan: HIV Antibody (routine testing w rflx), QuantiFERON-TB Gold Plus, Serum protein electrophoresis with reflex, IgG, IgA, IgM, Hepatitis panel, acute, CBC with Differential/Platelet, Glucose 6 phosphate dehydrogenase  Tendinopathy of left rotator cuff - s/p surgeryx2  Spondylosis of lumbar region without myelopathy or radiculopathy - L5-S1 fusion.  He continues to have some lower back pain.  Type 2 diabetes mellitus with other specified complication, with long-term current use of insulin (HCC)  Obstructive sleep apnea  Essential hypertension  Bilateral hearing loss, unspecified hearing loss type  Obesity, Class III, BMI 40-49.9 (morbid obesity) ([place])  Orders: Orders Placed This Encounter  Procedures  . XR Hand 2 View Left  . XR  Foot 2 Views Right  . XR Foot 2 Views Left  . XR Ankle 2 Views Left  . XR KNEE 3 VIEW RIGHT  . HIV Antibody (routine testing w rflx)  . QuantiFERON-TB Gold Plus  . Serum protein electrophoresis with reflex  . IgG, IgA, IgM  . Hepatitis panel, acute  . CBC with Differential/Platelet  . Glucose 6 phosphate dehydrogenase   No orders of the defined types were placed in this encounter.   Face-to-face time spent with patient was 45 minutes. Greater than 50% of time was spent in counseling and coordination of care.  Follow-Up Instructions: Return for Rheumatoid arthritis.   Bo Merino, MD  Note - This record has been created using Editor, commissioning.  Chart creation errors have been sought, but may not always  have been located. Such creation errors do not reflect on  the standard of medical care.

## 2018-12-16 DIAGNOSIS — E119 Type 2 diabetes mellitus without complications: Secondary | ICD-10-CM | POA: Diagnosis not present

## 2018-12-16 LAB — HM DIABETES EYE EXAM

## 2018-12-18 ENCOUNTER — Other Ambulatory Visit: Payer: 59

## 2018-12-18 ENCOUNTER — Other Ambulatory Visit: Payer: Self-pay

## 2018-12-18 DIAGNOSIS — Z0000 Encounter for general adult medical examination without abnormal findings: Secondary | ICD-10-CM | POA: Diagnosis not present

## 2018-12-19 LAB — FECAL OCCULT BLOOD, IMMUNOCHEMICAL: Fecal Occult Bld: NEGATIVE

## 2018-12-22 ENCOUNTER — Telehealth: Payer: Self-pay | Admitting: Student in an Organized Health Care Education/Training Program

## 2018-12-22 NOTE — Telephone Encounter (Signed)
Spoke today over the phone. FIT negative. Low risk for colon cancer. Plan to repeat FIT in 1 year.

## 2018-12-23 ENCOUNTER — Encounter: Payer: Self-pay | Admitting: *Deleted

## 2018-12-24 ENCOUNTER — Ambulatory Visit: Payer: 59 | Admitting: Rheumatology

## 2018-12-25 ENCOUNTER — Encounter: Payer: Self-pay | Admitting: Rheumatology

## 2018-12-25 ENCOUNTER — Ambulatory Visit: Payer: Self-pay

## 2018-12-25 ENCOUNTER — Ambulatory Visit: Payer: 59 | Admitting: Rheumatology

## 2018-12-25 ENCOUNTER — Other Ambulatory Visit: Payer: Self-pay

## 2018-12-25 VITALS — BP 154/88 | HR 77 | Resp 15 | Ht 72.0 in | Wt 267.2 lb

## 2018-12-25 DIAGNOSIS — M25561 Pain in right knee: Secondary | ICD-10-CM

## 2018-12-25 DIAGNOSIS — M79641 Pain in right hand: Secondary | ICD-10-CM

## 2018-12-25 DIAGNOSIS — M79672 Pain in left foot: Secondary | ICD-10-CM

## 2018-12-25 DIAGNOSIS — G4733 Obstructive sleep apnea (adult) (pediatric): Secondary | ICD-10-CM | POA: Diagnosis not present

## 2018-12-25 DIAGNOSIS — M25562 Pain in left knee: Secondary | ICD-10-CM | POA: Diagnosis not present

## 2018-12-25 DIAGNOSIS — M47816 Spondylosis without myelopathy or radiculopathy, lumbar region: Secondary | ICD-10-CM

## 2018-12-25 DIAGNOSIS — Z79899 Other long term (current) drug therapy: Secondary | ICD-10-CM | POA: Diagnosis not present

## 2018-12-25 DIAGNOSIS — M67912 Unspecified disorder of synovium and tendon, left shoulder: Secondary | ICD-10-CM | POA: Diagnosis not present

## 2018-12-25 DIAGNOSIS — M25572 Pain in left ankle and joints of left foot: Secondary | ICD-10-CM | POA: Diagnosis not present

## 2018-12-25 DIAGNOSIS — H9193 Unspecified hearing loss, bilateral: Secondary | ICD-10-CM

## 2018-12-25 DIAGNOSIS — Z794 Long term (current) use of insulin: Secondary | ICD-10-CM

## 2018-12-25 DIAGNOSIS — M79671 Pain in right foot: Secondary | ICD-10-CM | POA: Diagnosis not present

## 2018-12-25 DIAGNOSIS — E1169 Type 2 diabetes mellitus with other specified complication: Secondary | ICD-10-CM

## 2018-12-25 DIAGNOSIS — G8929 Other chronic pain: Secondary | ICD-10-CM

## 2018-12-25 DIAGNOSIS — M79642 Pain in left hand: Secondary | ICD-10-CM

## 2018-12-25 DIAGNOSIS — I10 Essential (primary) hypertension: Secondary | ICD-10-CM

## 2018-12-30 NOTE — Progress Notes (Signed)
I will discuss results at the follow-up visit.

## 2018-12-31 LAB — CBC WITH DIFFERENTIAL/PLATELET
Absolute Monocytes: 389 cells/uL (ref 200–950)
Basophils Absolute: 20 cells/uL (ref 0–200)
Basophils Relative: 0.3 %
Eosinophils Absolute: 147 cells/uL (ref 15–500)
Eosinophils Relative: 2.2 %
HCT: 44.5 % (ref 38.5–50.0)
Hemoglobin: 16.1 g/dL (ref 13.2–17.1)
Lymphs Abs: 1648 cells/uL (ref 850–3900)
MCH: 34.2 pg — ABNORMAL HIGH (ref 27.0–33.0)
MCHC: 36.2 g/dL — ABNORMAL HIGH (ref 32.0–36.0)
MCV: 94.5 fL (ref 80.0–100.0)
MPV: 10.7 fL (ref 7.5–12.5)
Monocytes Relative: 5.8 %
Neutro Abs: 4496 cells/uL (ref 1500–7800)
Neutrophils Relative %: 67.1 %
Platelets: 202 10*3/uL (ref 140–400)
RBC: 4.71 10*6/uL (ref 4.20–5.80)
RDW: 12.5 % (ref 11.0–15.0)
Total Lymphocyte: 24.6 %
WBC: 6.7 10*3/uL (ref 3.8–10.8)

## 2018-12-31 LAB — IGG, IGA, IGM
IgG (Immunoglobin G), Serum: 525 mg/dL — ABNORMAL LOW (ref 600–1640)
IgM, Serum: 94 mg/dL (ref 50–300)
Immunoglobulin A: 125 mg/dL (ref 47–310)

## 2018-12-31 LAB — PROTEIN ELECTROPHORESIS, SERUM, WITH REFLEX
Albumin ELP: 4.2 g/dL (ref 3.8–4.8)
Alpha 1: 0.3 g/dL (ref 0.2–0.3)
Alpha 2: 0.7 g/dL (ref 0.5–0.9)
Beta 2: 0.3 g/dL (ref 0.2–0.5)
Beta Globulin: 0.5 g/dL (ref 0.4–0.6)
Gamma Globulin: 0.6 g/dL — ABNORMAL LOW (ref 0.8–1.7)
Total Protein: 6.6 g/dL (ref 6.1–8.1)

## 2018-12-31 LAB — QUANTIFERON-TB GOLD PLUS
Mitogen-NIL: >10 IU/mL
NIL: 0.03 IU/mL
QuantiFERON-TB Gold Plus: NEGATIVE
TB1-NIL: <0 IU/mL
TB2-NIL: 0 IU/mL

## 2018-12-31 LAB — HEPATITIS PANEL, ACUTE
Hep A IgM: NONREACTIVE
Hep B C IgM: NONREACTIVE
Hepatitis B Surface Ag: NONREACTIVE
Hepatitis C Ab: NONREACTIVE
SIGNAL TO CUT-OFF: 0.01 (ref <1.00)

## 2018-12-31 LAB — HIV ANTIBODY (ROUTINE TESTING W REFLEX): HIV 1&2 Ab, 4th Generation: NONREACTIVE

## 2018-12-31 LAB — IFE INTERPRETATION: Immunofix Electr Int: NOT DETECTED

## 2018-12-31 LAB — GLUCOSE 6 PHOSPHATE DEHYDROGENASE: G-6PDH: 12.5 U/g Hgb (ref 7.0–20.5)

## 2019-01-01 ENCOUNTER — Other Ambulatory Visit: Payer: Self-pay | Admitting: Student in an Organized Health Care Education/Training Program

## 2019-01-01 DIAGNOSIS — M47816 Spondylosis without myelopathy or radiculopathy, lumbar region: Secondary | ICD-10-CM

## 2019-01-01 MED FILL — HYDROCODON-APAP 7.5-325: 7.5-325 | 23 days supply | Qty: 45 | Fill #0

## 2019-01-01 MED FILL — LOSARTAN POTASSIUM 100 MG T: 100 | 90 days supply | Qty: 90 | Fill #2

## 2019-01-01 MED FILL — metFORMIN HCL 1000 MG TABS: 1000 | 90 days supply | Qty: 180 | Fill #2

## 2019-01-01 MED FILL — CYCLOBENZAPRINE 10 MG TAB: 10 | 90 days supply | Qty: 90 | Fill #0

## 2019-01-01 NOTE — Telephone Encounter (Signed)
Last office visit: 11/10/18 Last UDS:02/10/18 Last Refill: 11/21/18 Next appt: 02/09/19

## 2019-01-14 NOTE — Progress Notes (Signed)
Office Visit Note  Patient: Adam [name]             Date of Birth: 05-08-1970           MRN: [id number]             PCP: Axel Filler, MD Referring: Axel Filler,* Visit Date: 01/28/2019 Occupation: @GUAROCC @  Subjective:  Pain and swelling in multiple joints.   History of Present Illness: Adam [name] is a 48 y.o. male with history of seropositive rheumatoid arthritis.  He states he continues to have pain and swelling in multiple joints.  He is having significant morning stiffness and difficulty making a fist.  He has been experiencing nocturnal pain.  He continues to have discomfort in his both knees and left ankle.  Activities of Daily Living:  Patient reports morning stiffness for 1 hour.   Patient Reports nocturnal pain.  Difficulty dressing/grooming: Denies Difficulty climbing stairs: Denies Difficulty getting out of chair: Denies Difficulty using hands for taps, buttons, cutlery, and/or writing: Denies  Review of Systems  Constitutional: Positive for fatigue. Negative for night sweats.  HENT: Positive for mouth dryness. Negative for mouth sores and nose dryness.   Eyes: Negative for redness, itching and dryness.  Respiratory: Negative for shortness of breath, wheezing and difficulty breathing.   Cardiovascular: Negative for chest pain, palpitations, hypertension, irregular heartbeat and swelling in legs/feet.  Gastrointestinal: Negative for abdominal pain, blood in stool, constipation and diarrhea.  Endocrine: Negative for increased urination.  Genitourinary: Negative for difficulty urinating and painful urination.  Musculoskeletal: Positive for arthralgias, joint pain, joint swelling and morning stiffness. Negative for myalgias, muscle weakness, muscle tenderness and myalgias.  Skin: Negative for color change, rash, hair loss, nodules/bumps, redness, skin tightness, ulcers and sensitivity to sunlight.  Allergic/Immunologic: Negative for  susceptible to infections.  Neurological: Positive for numbness. Negative for dizziness, fainting, light-headedness, headaches, memory loss, night sweats and weakness.  Hematological: Negative for bruising/bleeding tendency and swollen glands.  Psychiatric/Behavioral: Negative for depressed mood, confusion and sleep disturbance. The patient is not nervous/anxious.     PMFS History:  Patient Active Problem List   Diagnosis Date Noted   Synovitis of hand 11/10/2018   Hearing loss 11/12/2016   Obesity, Class III, BMI 40-49.9 (morbid obesity) ([place]) 11/12/2016   Osteoarthritis of lumbar spine 04/09/2016   Tendinopathy of left rotator cuff 12/30/2015   Obstructive sleep apnea 02/28/2015   Type 2 diabetes mellitus with other specified complication ([place]) 0000000   Erectile dysfunction 01/06/2015   Healthcare maintenance 01/06/2015   Essential hypertension 01/06/2015    Past Medical History:  Diagnosis Date   Anxiety    Arthritis    lower back, hands   Deviated septum    nasal turbinate hypertrophy   Hypertension    states under control with meds., has been on medication since age 50   Insulin dependent diabetes mellitus    type 2 DM   Morbid obesity ([place])    Sleep apnea    uses CPAP "most of the time", per pt.    Family History  Problem Relation Age of Onset   Hyperlipidemia Mother    Aneurysm Mother    Hypertension Mother    Diabetes Father    Hyperlipidemia Father    Hypertension Father    Healthy Daughter    Healthy Daughter    Past Surgical History:  Procedure Laterality Date   CARPAL TUNNEL RELEASE Right 07/2014   LAPAROSCOPIC GASTRIC SLEEVE RESECTION  Dr. Excell Seltzer 09-09-17   LAPAROSCOPIC GASTRIC SLEEVE RESECTION N/A 09/09/2017   Procedure: LAPAROSCOPIC GASTRIC SLEEVE RESECTION  AND ERAS PATHWAY;  Surgeon: Excell Seltzer, MD;  Location: [facility];  Service: General;  Laterality: N/A;   LUMBAR FUSION     NASAL SEPTOPLASTY W/  TURBINOPLASTY Bilateral 03/22/2017   Procedure: NASAL SEPTOPLASTY WITH  BILATERAL INFERIOR   TURBINATE REDUCTION;  Surgeon: Jerrell Belfast, MD;  Location: [facility];  Service: ENT;  Laterality: Bilateral;   SHOULDER ARTHROSCOPY Left    x 2   TONSILLECTOMY AND ADENOIDECTOMY     TYMPANOSTOMY TUBE PLACEMENT Bilateral    WISDOM TOOTH EXTRACTION     Social History   Social History Narrative   Not on file   Immunization History  Administered Date(s) Administered   Influenza Inj Mdck Quad Pf 01/10/2018   Influenza,inj,Quad PF,6+ Mos 01/06/2015, 01/07/2017, 01/22/2019   Influenza-Unspecified 03/10/2016   Pneumococcal Polysaccharide-23 01/06/2015   Tdap 12/09/2010     Objective: Vital Signs: BP (!) 143/91 (BP Location: Left Arm, Patient Position: Sitting, Cuff Size: Normal)    Pulse 74    Resp 16    Ht 6' (1.829 m)    Wt 268 lb (121.6 kg)    BMI 36.35 kg/m    Physical Exam Vitals signs and nursing note reviewed.  Constitutional:      Appearance: He is well-developed.  HENT:     Head: Normocephalic and atraumatic.  Eyes:     Conjunctiva/sclera: Conjunctivae normal.     Pupils: Pupils are equal, round, and reactive to light.  Neck:     Musculoskeletal: Normal range of motion and neck supple.  Cardiovascular:     Rate and Rhythm: Normal rate and regular rhythm.     Heart sounds: Normal heart sounds.  Pulmonary:     Effort: Pulmonary effort is normal.     Breath sounds: Normal breath sounds.  Abdominal:     General: Bowel sounds are normal.     Palpations: Abdomen is soft.  Skin:    General: Skin is warm and dry.     Capillary Refill: Capillary refill takes less than 2 seconds.  Neurological:     Mental Status: He is alert and oriented to person, place, and time.  Psychiatric:        Behavior: Behavior normal.      Musculoskeletal Exam: C-spine was in good range of motion.  Shoulder joints and elbow joints with good range of motion.  He has tenderness on palpation  of bilateral wrist joints.  He has synovitis over bilateral second and third MCP joint.  He has warmth and tenderness on range of motion of bilateral knee joints.  He has swelling over his left ankle joint. CDAI Exam: CDAI Score: 13.2  Patient Global: 6 mm; Provider Global: 6 mm Swollen: 5 ; Tender: 9  Joint Exam      Right  Left  Wrist   Tender   Tender  MCP 2  Swollen Tender  Swollen Tender  MCP 3  Swollen Tender  Swollen Tender  Knee   Tender   Tender  Ankle     Swollen Tender     Investigation: No additional findings.  Imaging: No results found.  Recent Labs: Lab Results  Component Value Date   WBC 6.7 12/25/2018   HGB 16.1 12/25/2018   PLT 202 12/25/2018   NA 142 11/10/2018   K 3.8 11/10/2018   CL 97 11/10/2018   CO2 24 11/10/2018   GLUCOSE 136 (  H) 11/10/2018   BUN 14 11/10/2018   CREATININE 0.91 11/10/2018   BILITOT 0.3 11/10/2018   ALKPHOS 87 11/10/2018   AST 38 11/10/2018   ALT 38 11/10/2018   PROT 6.6 12/25/2018   ALBUMIN 4.7 11/10/2018   CALCIUM 9.7 11/10/2018   GFRAA 113 11/10/2018   QFTBGOLDPLUS NEGATIVE 12/25/2018  December 25, 2018 IFE negative, TB Gold negative, hepatitis A- hepatitis B- hepatitis C negative, HIV negative, G6PD normal Chest x-ray February 2019 Speciality Comments: No specialty comments available.  Procedures:  No procedures performed Allergies: Patient has no known allergies.   Assessment / Plan:     Visit Diagnoses: Rheumatoid arthritis involving multiple sites with positive rheumatoid factor ([place]) - Patient has synovitis with radiographic changes.  He has synovitis on examination and ongoing stiffness.  He also has some nocturnal pain.  Different treatment options and their side effects were discussed at length.  Patient is hesitant to go on methotrexate as he drinks on regular basis.  He states he will cut down on the alcohol before he will start methotrexate.  We had detailed discussion regarding other treatment options.  He was  in agreement to proceed with Plaquenil.  Indications side effects contraindications were discussed at length.  He was placed on Plaquenil 200 mg p.o. twice daily.  He will get baseline eye examination.  He will get labs in a month and then 3 months.  High risk medication use-standing orders were given.  Chondromalacia patellae, right knee-he has chronic discomfort.  Knee joint exercises were discussed.  Chronic pain of left ankle-he has pain and swelling in his left ankle joint.  Tendinopathy of left rotator cuff-chronic pain.  Spondylosis of lumbar region without myelopathy or radiculopathy-chronic pain  Type 2 diabetes mellitus with other specified complication, with long-term current use of insulin ([place])  Essential hypertension-his blood pressure is still elevated.  Have advised him to monitor blood pressure closely.  Obstructive sleep apnea  Obesity, Class III, BMI 40-49.9 (morbid obesity) (HCC)  Bilateral hearing loss, unspecified hearing loss type  Orders: Orders Placed This Encounter  Procedures   CBC with Differential/Platelet   COMPLETE METABOLIC PANEL WITH GFR   Meds ordered this encounter  Medications   hydroxychloroquine (PLAQUENIL) 200 MG tablet    Sig: Take 1 tablet (200 mg total) by mouth 2 (two) times daily.    Dispense:  180 tablet    Refill:  0    Face-to-face time spent with patient was 30 minutes. Greater than 50% of time was spent in counseling and coordination of care.  Follow-Up Instructions: Return in about 4 weeks (around 02/25/2019) for Rheumatoid arthritis, Osteoarthritis, PLQ NSFU.   Bo Merino, MD  Note - This record has been created using Editor, commissioning.  Chart creation errors have been sought, but may not always  have been located. Such creation errors do not reflect on  the standard of medical care.

## 2019-01-28 ENCOUNTER — Other Ambulatory Visit: Payer: Self-pay

## 2019-01-28 ENCOUNTER — Encounter: Payer: Self-pay | Admitting: Rheumatology

## 2019-01-28 ENCOUNTER — Ambulatory Visit (INDEPENDENT_AMBULATORY_CARE_PROVIDER_SITE_OTHER): Payer: 59 | Admitting: Rheumatology

## 2019-01-28 VITALS — BP 143/91 | HR 74 | Resp 16 | Ht 72.0 in | Wt 268.0 lb

## 2019-01-28 DIAGNOSIS — Z79899 Other long term (current) drug therapy: Secondary | ICD-10-CM

## 2019-01-28 DIAGNOSIS — M2241 Chondromalacia patellae, right knee: Secondary | ICD-10-CM

## 2019-01-28 DIAGNOSIS — M47816 Spondylosis without myelopathy or radiculopathy, lumbar region: Secondary | ICD-10-CM | POA: Diagnosis not present

## 2019-01-28 DIAGNOSIS — I10 Essential (primary) hypertension: Secondary | ICD-10-CM | POA: Diagnosis not present

## 2019-01-28 DIAGNOSIS — M67912 Unspecified disorder of synovium and tendon, left shoulder: Secondary | ICD-10-CM | POA: Diagnosis not present

## 2019-01-28 DIAGNOSIS — G8929 Other chronic pain: Secondary | ICD-10-CM

## 2019-01-28 DIAGNOSIS — E1169 Type 2 diabetes mellitus with other specified complication: Secondary | ICD-10-CM | POA: Diagnosis not present

## 2019-01-28 DIAGNOSIS — Z794 Long term (current) use of insulin: Secondary | ICD-10-CM

## 2019-01-28 DIAGNOSIS — M25572 Pain in left ankle and joints of left foot: Secondary | ICD-10-CM | POA: Diagnosis not present

## 2019-01-28 DIAGNOSIS — G4733 Obstructive sleep apnea (adult) (pediatric): Secondary | ICD-10-CM | POA: Diagnosis not present

## 2019-01-28 DIAGNOSIS — M0579 Rheumatoid arthritis with rheumatoid factor of multiple sites without organ or systems involvement: Secondary | ICD-10-CM

## 2019-01-28 DIAGNOSIS — H9193 Unspecified hearing loss, bilateral: Secondary | ICD-10-CM

## 2019-01-28 MED ORDER — HYDROXYCHLOROQUINE SULFATE 200 MG PO TABS: 200.0000 mg | ORAL_TABLET | Freq: Two times a day (BID) | ORAL | 0 refills | Status: DC

## 2019-01-28 MED FILL — HYDROXYCHLOROQUINE SULFATE: 200 | 30 days supply | Qty: 60 | Fill #0

## 2019-01-28 NOTE — Patient Instructions (Addendum)
Standing Labs We placed an order today for your standing lab work.    Please come back and get your standing labs in 1 month, 3 month, and then every 5 months.  We have open lab daily Monday through Thursday from 8:30-12:30 PM and 1:30-4:30 PM and Friday from 8:30-12:30 PM and 1:30-4:00 PM at the office of Dr. Bo Merino.   You may experience shorter wait times on Monday and Friday afternoons. The office is located at [address] [place], [place],  [address] No appointment is necessary.   Labs are drawn by [facility].  You may receive a bill from [facility] for your lab work.  If you wish to have your labs drawn at another location, please call the office 24 hours in advance to send orders.  If you have any questions regarding directions or hours of operation,  please call [phone].   Just as a reminder please drink plenty of water prior to coming for your lab work. Thanks!   Vaccines You are taking a medication(s) that can suppress your immune system.  The following immunizations are recommended: . Flu annually . Pneumonia (Pneumovax 23 and Prevnar 13 spaced at least 1 year apart) . Shingrix  Please check with your PCP to make sure you are up to date.  Hydroxychloroquine tablets What is this medicine? HYDROXYCHLOROQUINE (hye drox ee KLOR oh kwin) is used to treat rheumatoid arthritis and systemic lupus erythematosus. It is also used to treat malaria. This medicine may be used for other purposes; ask your health care provider or pharmacist if you have questions. COMMON BRAND NAME(S): Plaquenil, Quineprox What should I tell my health care provider before I take this medicine? They need to know if you have any of these conditions:  diabetes  eye disease, vision problems  G6PD deficiency  heart disease  history of irregular heartbeat  if you often drink alcohol  kidney disease  liver disease  porphyria  psoriasis  an unusual or allergic reaction to  chloroquine, hydroxychloroquine, other medicines, foods, dyes, or preservatives  pregnant or trying to get pregnant  breast-feeding How should I use this medicine? Take this medicine by mouth with a glass of water. Follow the directions on the prescription label. Do not cut, crush or chew this medicine. Swallow the tablets whole. Take this medicine with food. Avoid taking antacids within 4 hours of taking this medicine. It is best to separate these medicines by at least 4 hours. Take your medicine at regular intervals. Do not take it more often than directed. Take all of your medicine as directed even if you think you are better. Do not skip doses or stop your medicine early. Talk to your pediatrician regarding the use of this medicine in children. While this drug may be prescribed for selected conditions, precautions do apply. Overdosage: If you think you have taken too much of this medicine contact a poison control center or emergency room at once. NOTE: This medicine is only for you. Do not share this medicine with others. What if I miss a dose? If you miss a dose, take it as soon as you can. If it is almost time for your next dose, take only that dose. Do not take double or extra doses. What may interact with this medicine? Do not take this medicine with any of the following medications:  cisapride  dronedarone  pimozide  thioridazine This medicine may also interact with the following medications:  ampicillin  antacids  cimetidine  cyclosporine  digoxin  kaolin  medicines for diabetes, like insulin, glipizide, glyburide  medicines for seizures like carbamazepine, phenobarbital, phenytoin  mefloquine  methotrexate  other medicines that prolong the QT interval (cause an abnormal heart rhythm)  praziquantel This list may not describe all possible interactions. Give your health care provider a list of all the medicines, herbs, non-prescription drugs, or dietary  supplements you use. Also tell them if you smoke, drink alcohol, or use illegal drugs. Some items may interact with your medicine. What should I watch for while using this medicine? Visit your health care professional for regular checks on your progress. Tell your health care professional if your symptoms do not start to get better or if they get worse. You may need blood work done while you are taking this medicine. If you take other medicines that can affect heart rhythm, you may need more testing. Talk to your health care professional if you have questions. Your vision may be tested before and during use of this medicine. Tell your health care professional right away if you have any change in your eyesight. What side effects may I notice from receiving this medicine? Side effects that you should report to your doctor or health care professional as soon as possible:  allergic reactions like skin rash, itching or hives, swelling of the face, lips, or tongue  changes in vision  decreased hearing or ringing of the ears  muscle weakness  redness, blistering, peeling or loosening of the skin, including inside the mouth  sensitivity to light  signs and symptoms of a dangerous change in heartbeat or heart rhythm like chest pain; dizziness; fast or irregular heartbeat; palpitations; feeling faint or lightheaded, falls; breathing problems  signs and symptoms of liver injury like dark yellow or brown urine; general ill feeling or flu-like symptoms; light-colored stools; loss of appetite; nausea; right upper belly pain; unusually weak or tired; yellowing of the eyes or skin  signs and symptoms of low blood sugar such as feeling anxious; confusion; dizziness; increased hunger; unusually weak or tired; sweating; shakiness; cold; irritable; headache; blurred vision; fast heartbeat; loss of consciousness  suicidal thoughts  uncontrollable head, mouth, neck, arm, or leg movements Side effects that  usually do not require medical attention (report to your doctor or health care professional if they continue or are bothersome):  diarrhea  dizziness  hair loss  headache  irritable  loss of appetite  nausea, vomiting  stomach pain This list may not describe all possible side effects. Call your doctor for medical advice about side effects. You may report side effects to FDA at 1-800-FDA-1088. Where should I keep my medicine? Keep out of the reach of children. Store at room temperature between 15 and 30 degrees C (59 and 86 degrees F). Protect from moisture and light. Throw away any unused medicine after the expiration date. NOTE: This sheet is a summary. It may not cover all possible information. If you have questions about this medicine, talk to your doctor, pharmacist, or health care provider.  2020 Elsevier/Gold Standard (2018-08-18 12:56:32)  

## 2019-01-28 NOTE — Progress Notes (Signed)
Pharmacy Note  Subjective: Patient presents today to the [facility] to see Dr. Estanislado Pandy.  Patient seen by the pharmacist for counseling on hydroxychloroquine rheumatoid arthritis.  He is naive to therapy.    Objective: CMP     Component Value Date/Time   NA 142 11/10/2018 1104   K 3.8 11/10/2018 1104   CL 97 11/10/2018 1104   CO2 24 11/10/2018 1104   GLUCOSE 136 (H) 11/10/2018 1104   GLUCOSE 130 (H) 09/04/2017 0854   BUN 14 11/10/2018 1104   CREATININE 0.91 11/10/2018 1104   CALCIUM 9.7 11/10/2018 1104   PROT 6.6 12/25/2018 0849   PROT 6.5 11/10/2018 1104   ALBUMIN 4.7 11/10/2018 1104   AST 38 11/10/2018 1104   ALT 38 11/10/2018 1104   ALKPHOS 87 11/10/2018 1104   BILITOT 0.3 11/10/2018 1104   GFRNONAA 98 11/10/2018 1104   GFRAA 113 11/10/2018 1104    CBC    Component Value Date/Time   WBC 6.7 12/25/2018 0849   RBC 4.71 12/25/2018 0849   HGB 16.1 12/25/2018 0849   HGB 15.6 11/12/2016 0855   HCT 44.5 12/25/2018 0849   HCT 45.0 11/12/2016 0855   PLT 202 12/25/2018 0849   PLT 214 11/12/2016 0855   MCV 94.5 12/25/2018 0849   MCV 93 11/12/2016 0855   MCH 34.2 (H) 12/25/2018 0849   MCHC 36.2 (H) 12/25/2018 0849   RDW 12.5 12/25/2018 0849   RDW 13.3 11/12/2016 0855   LYMPHSABS 1,648 12/25/2018 0849   MONOABS 0.9 09/10/2017 0410   EOSABS 147 12/25/2018 0849   BASOSABS 20 12/25/2018 0849    Assessment/Plan:  We had in-depth discussion about treatment options including Plaquenil, methotrexate, and Arava.  Recommended methotrexate but patient drink 3-4 alcoholic drinks per night.  He is willing to quit drinking but would like proceed with Plaquenil first while he gradually decreases his drinking.  Patient was counseled on the purpose, proper use, and adverse effects of hydroxychloroquine including nausea/diarrhea, skin rash, headaches, and sun sensitivity.  Discussed importance of annual eye exams while on hydroxychloroquine to monitor to ocular toxicity  and discussed importance of frequent laboratory monitoring.  Provided patient with eye exam form for baseline ophthalmologic exam and standing lab instructions.  Provided patient with educational materials on hydroxychloroquine and answered all questions.  Patient consented to hydroxychloroquine.  Will upload consent in the media tab.    Dose will be Plaquenil 200 mg twice daily based on weight of 121.6 kg, height 6', and GFR of 113 ml/min.  Prescription sent to [facility] per patient request.   All questions encouraged and answered.  Instructed patient to call with any other questions or concerns.  Mariella Saa, PharmD, [place] Pharmacist [phone]  01/28/2019 4:07 PM

## 2019-02-06 ENCOUNTER — Other Ambulatory Visit: Payer: Self-pay | Admitting: Student in an Organized Health Care Education/Training Program

## 2019-02-06 ENCOUNTER — Other Ambulatory Visit: Payer: Self-pay | Admitting: *Deleted

## 2019-02-06 DIAGNOSIS — M47816 Spondylosis without myelopathy or radiculopathy, lumbar region: Secondary | ICD-10-CM

## 2019-02-06 DIAGNOSIS — I10 Essential (primary) hypertension: Secondary | ICD-10-CM

## 2019-02-06 MED ORDER — HYDROCHLOROTHIAZIDE 25 MG PO TABS: 25.0000 mg | ORAL_TABLET | Freq: Every day | ORAL | 3 refills | Status: DC

## 2019-02-06 MED ORDER — AMLODIPINE BESYLATE 5 MG PO TABS: 5.0000 mg | ORAL_TABLET | Freq: Every day | ORAL | 3 refills | Status: DC

## 2019-02-06 MED FILL — HYDROCHLOROTHIAZIDE 25 MG T: 25 | 90 days supply | Qty: 90 | Fill #0

## 2019-02-06 MED FILL — DULoxetine HCL 60 MG CPEP: 60 | 90 days supply | Qty: 90 | Fill #0

## 2019-02-06 MED FILL — TADALAFIL 20 MG TABS: 20 | 50 days supply | Qty: 10 | Fill #0

## 2019-02-06 MED FILL — AMLODIPINE BESYLATE 5 MG TA: 5 | 90 days supply | Qty: 90 | Fill #0

## 2019-02-06 MED FILL — HYDROCODON-APAP 7.5-325: 7.5-325 | 23 days supply | Qty: 45 | Fill #0

## 2019-02-06 MED FILL — PIOGLITAZONE HCL 45 MG TAB: 45 | 90 days supply | Qty: 90 | Fill #1

## 2019-02-09 ENCOUNTER — Ambulatory Visit: Payer: 59 | Admitting: Student in an Organized Health Care Education/Training Program

## 2019-02-12 NOTE — Progress Notes (Signed)
Office Visit Note  Patient: Adam [name]             Date of Birth: October 18, 1970           MRN: [id number]             PCP: Axel Filler, MD Referring: Axel Filler,* Visit Date: 02/26/2019 Occupation: @GUAROCC @  Subjective:  Right hand pain and swelling     History of Present Illness: Adam [name] is a 48 y.o. male who presents for rheumatoid arthritis follow up.  He is taking Plaquenil 200 mg twice daily whichhe started at last office a month ago. He experience diarrhea when he first started Plaquenil but has subsided.  He reports improvement in pain and swelling in bilateral hands.  The discomfort in his hands still interrupts his sleep at night.  He states he does not drop items from his hands as frequently.    Activities of Daily Living:  Patient reports morning stiffness for 1.5 hours.   Patient Reports nocturnal pain.  Difficulty dressing/grooming: Denies Difficulty climbing stairs: Denies Difficulty getting out of chair: Denies Difficulty using hands for taps, buttons, cutlery, and/or writing: Denies  Review of Systems  Constitutional: Positive for fatigue. Negative for night sweats.  HENT: Positive for mouth dryness. Negative for mouth sores and nose dryness.   Eyes: Negative for redness and dryness.  Respiratory: Negative for shortness of breath and difficulty breathing.   Cardiovascular: Negative for chest pain, palpitations, hypertension, irregular heartbeat and swelling in legs/feet.  Gastrointestinal: Negative for constipation and diarrhea.  Endocrine: Negative for increased urination.  Genitourinary: Negative for difficulty urinating.  Musculoskeletal: Positive for arthralgias, joint pain, joint swelling and morning stiffness. Negative for myalgias, muscle weakness, muscle tenderness and myalgias.  Skin: Negative for color change, rash, hair loss, nodules/bumps, skin tightness, ulcers and sensitivity to sunlight.  Allergic/Immunologic:  Negative for susceptible to infections.  Neurological: Positive for weakness. Negative for dizziness, fainting, memory loss and night sweats.  Hematological: Negative for bruising/bleeding tendency and swollen glands.  Psychiatric/Behavioral: Negative for depressed mood and sleep disturbance. The patient is not nervous/anxious.     PMFS History:  Patient Active Problem List   Diagnosis Date Noted  . Synovitis of hand 11/10/2018  . Hearing loss 11/12/2016  . Obesity, Class III, BMI 40-49.9 (morbid obesity) ([place]) 11/12/2016  . Osteoarthritis of lumbar spine 04/09/2016  . Tendinopathy of left rotator cuff 12/30/2015  . Obstructive sleep apnea 02/28/2015  . Type 2 diabetes mellitus with other specified complication ([place]) 0000000  . Erectile dysfunction 01/06/2015  . Healthcare maintenance 01/06/2015  . Essential hypertension 01/06/2015    Past Medical History:  Diagnosis Date  . Anxiety   . Arthritis    lower back, hands  . Deviated septum    nasal turbinate hypertrophy  . Hypertension    states under control with meds., has been on medication since age 33  . Insulin dependent diabetes mellitus    type 2 DM  . Morbid obesity ([place])   . Sleep apnea    uses CPAP "most of the time", per pt.    Family History  Problem Relation Age of Onset  . Hyperlipidemia Mother   . Aneurysm Mother   . Hypertension Mother   . Diabetes Father   . Hyperlipidemia Father   . Hypertension Father   . Healthy Daughter   . Healthy Daughter    Past Surgical History:  Procedure Laterality Date  . CARPAL TUNNEL RELEASE Right 07/2014  .  LAPAROSCOPIC GASTRIC SLEEVE RESECTION     Dr. Excell Seltzer 09-09-17  . LAPAROSCOPIC GASTRIC SLEEVE RESECTION N/A 09/09/2017   Procedure: LAPAROSCOPIC GASTRIC SLEEVE RESECTION  AND ERAS PATHWAY;  Surgeon: Excell Seltzer, MD;  Location: [facility];  Service: General;  Laterality: N/A;  . LUMBAR FUSION    . NASAL SEPTOPLASTY W/ TURBINOPLASTY Bilateral 03/22/2017    Procedure: NASAL SEPTOPLASTY WITH  BILATERAL INFERIOR   TURBINATE REDUCTION;  Surgeon: Jerrell Belfast, MD;  Location: [facility];  Service: ENT;  Laterality: Bilateral;  . SHOULDER ARTHROSCOPY Left    x 2  . TONSILLECTOMY AND ADENOIDECTOMY    . TYMPANOSTOMY TUBE PLACEMENT Bilateral   . WISDOM TOOTH EXTRACTION     Social History   Social History Narrative  . Not on file   Immunization History  Administered Date(s) Administered  . Influenza Inj Mdck Quad Pf 01/10/2018  . Influenza,inj,Quad PF,6+ Mos 01/06/2015, 01/07/2017, 01/22/2019  . Influenza-Unspecified 03/10/2016  . Pneumococcal Polysaccharide-23 01/06/2015  . Tdap 12/09/2010     Objective: Vital Signs: BP (!) 143/79 (BP Location: Left Arm, Patient Position: Sitting, Cuff Size: Normal)   Pulse 78   Resp 16   Ht 6' (1.829 m)   Wt 268 lb 9.6 oz (121.8 kg)   BMI 36.43 kg/m    Physical Exam Vitals signs and nursing note reviewed.  Constitutional:      Appearance: He is well-developed.  HENT:     Head: Normocephalic and atraumatic.  Eyes:     Conjunctiva/sclera: Conjunctivae normal.     Pupils: Pupils are equal, round, and reactive to light.  Neck:     Musculoskeletal: Normal range of motion and neck supple.  Cardiovascular:     Rate and Rhythm: Normal rate and regular rhythm.     Heart sounds: Normal heart sounds.  Pulmonary:     Effort: Pulmonary effort is normal.     Breath sounds: Normal breath sounds.  Abdominal:     General: Bowel sounds are normal.     Palpations: Abdomen is soft.  Skin:    General: Skin is warm and dry.     Capillary Refill: Capillary refill takes less than 2 seconds.  Neurological:     Mental Status: He is alert and oriented to person, place, and time.  Psychiatric:        Behavior: Behavior normal.      Musculoskeletal Exam: C-spine thoracic and lumbar spine with good range of motion.  Shoulder joints, elbow joints, wrist joints with good range of motion.  He has synovitis in his  right third MCP joint.  None of the other joints are swollen or painful.  CDAI Exam: CDAI Score: 3.2  Patient Global: 6 mm; Provider Global: 6 mm Swollen: 1 ; Tender: 1  Joint Exam      Right  Left  MCP 3  Swollen Tender        Investigation: No additional findings.  Imaging: No results found.  Recent Labs: Lab Results  Component Value Date   WBC 6.7 12/25/2018   HGB 16.1 12/25/2018   PLT 202 12/25/2018   NA 142 11/10/2018   K 3.8 11/10/2018   CL 97 11/10/2018   CO2 24 11/10/2018   GLUCOSE 136 (H) 11/10/2018   BUN 14 11/10/2018   CREATININE 0.91 11/10/2018   BILITOT 0.3 11/10/2018   ALKPHOS 87 11/10/2018   AST 38 11/10/2018   ALT 38 11/10/2018   PROT 6.6 12/25/2018   ALBUMIN 4.7 11/10/2018   CALCIUM 9.7 11/10/2018  GFRAA 113 11/10/2018   QFTBGOLDPLUS NEGATIVE 12/25/2018    Speciality Comments: No specialty comments available.  Procedures:  No procedures performed Allergies: Patient has no known allergies.   Assessment / Plan:     Visit Diagnoses: Rheumatoid arthritis involving multiple sites with positive rheumatoid factor (HCC)-patient is doing better on Plaquenil.  Although he still have some synovitis in the right third MCP.  Overall his symptoms are improving.  He has been on Plaquenil only for a month.  I discussed the option of moving to methotrexate.  He would like to wait for another couple of months.  High risk medication use - Plaquenil 200 mg 1 tablet twice daily started in October 2020.  Baseline Plaquenil eye exam he had previously and is going to results faxed to our office.  Due for CBC/CMP today to monitor for drug toxicity.  He will return in 3 months for labs and then every 5 months.- Plan: CBC, CMP  Chondromalacia patellae, right knee-he has been doing exercises which are helpful.  Tendinopathy of left rotator cuff-he has some chronic discomfort.  Spondylosis of lumbar region without myelopathy or radiculopathy-currently not having much  discomfort.  Bilateral hearing loss, unspecified hearing loss type  Obstructive sleep apnea  Essential hypertension-his blood pressures are still elevated  Obesity, Class III, BMI 40-49.9 (morbid obesity) ([place])  Type 2 diabetes mellitus with other specified complication, with long-term current use of insulin ([place])  Orders: Orders Placed This Encounter  Procedures  . CBC  . CMP   No orders of the defined types were placed in this encounter.     Follow-Up Instructions: Return in about 2 months (around 04/28/2019) for Rheumatoid arthritis.   Bo Merino, MD  Note - This record has been created using Editor, commissioning.  Chart creation errors have been sought, but may not always  have been located. Such creation errors do not reflect on  the standard of medical care.

## 2019-02-26 ENCOUNTER — Encounter: Payer: Self-pay | Admitting: Rheumatology

## 2019-02-26 ENCOUNTER — Ambulatory Visit (INDEPENDENT_AMBULATORY_CARE_PROVIDER_SITE_OTHER): Payer: 59 | Admitting: Rheumatology

## 2019-02-26 ENCOUNTER — Other Ambulatory Visit: Payer: Self-pay

## 2019-02-26 VITALS — BP 143/79 | HR 78 | Resp 16 | Ht 72.0 in | Wt 268.6 lb

## 2019-02-26 DIAGNOSIS — M47816 Spondylosis without myelopathy or radiculopathy, lumbar region: Secondary | ICD-10-CM | POA: Diagnosis not present

## 2019-02-26 DIAGNOSIS — I10 Essential (primary) hypertension: Secondary | ICD-10-CM

## 2019-02-26 DIAGNOSIS — M0579 Rheumatoid arthritis with rheumatoid factor of multiple sites without organ or systems involvement: Secondary | ICD-10-CM

## 2019-02-26 DIAGNOSIS — G4733 Obstructive sleep apnea (adult) (pediatric): Secondary | ICD-10-CM | POA: Diagnosis not present

## 2019-02-26 DIAGNOSIS — H9193 Unspecified hearing loss, bilateral: Secondary | ICD-10-CM | POA: Diagnosis not present

## 2019-02-26 DIAGNOSIS — M67912 Unspecified disorder of synovium and tendon, left shoulder: Secondary | ICD-10-CM

## 2019-02-26 DIAGNOSIS — Z794 Long term (current) use of insulin: Secondary | ICD-10-CM

## 2019-02-26 DIAGNOSIS — M2241 Chondromalacia patellae, right knee: Secondary | ICD-10-CM

## 2019-02-26 DIAGNOSIS — Z79899 Other long term (current) drug therapy: Secondary | ICD-10-CM | POA: Diagnosis not present

## 2019-02-26 DIAGNOSIS — E1169 Type 2 diabetes mellitus with other specified complication: Secondary | ICD-10-CM

## 2019-02-26 NOTE — Patient Instructions (Signed)
Standing Labs We placed an order today for your standing lab work.    Please come back and get your standing labs in January and then every 5 months.  We have open lab daily Monday through Thursday from 8:30-12:30 PM and 1:30-4:30 PM and Friday from 8:30-12:30 PM and 1:30-4:00 PM at the office of Dr. Bo Merino.   You may experience shorter wait times on Monday and Friday afternoons. The office is located at [address], [place], [place], [place] [address] No appointment is necessary.   Labs are drawn by [facility].  You may receive a bill from [facility] for your lab work.  If you wish to have your labs drawn at another location, please call the office 24 hours in advance to send orders.  If you have any questions regarding directions or hours of operation,  please call [phone].   Just as a reminder please drink plenty of water prior to coming for your lab work. Thanks!

## 2019-02-27 LAB — CBC WITH DIFFERENTIAL/PLATELET
Absolute Monocytes: 378 cells/uL (ref 200–950)
Basophils Absolute: 19 cells/uL (ref 0–200)
Basophils Relative: 0.3 %
Eosinophils Absolute: 229 cells/uL (ref 15–500)
Eosinophils Relative: 3.7 %
HCT: 41.7 % (ref 38.5–50.0)
Hemoglobin: 14.3 g/dL (ref 13.2–17.1)
Lymphs Abs: 1953 cells/uL (ref 850–3900)
MCH: 32.4 pg (ref 27.0–33.0)
MCHC: 34.3 g/dL (ref 32.0–36.0)
MCV: 94.3 fL (ref 80.0–100.0)
MPV: 10.5 fL (ref 7.5–12.5)
Monocytes Relative: 6.1 %
Neutro Abs: 3621 cells/uL (ref 1500–7800)
Neutrophils Relative %: 58.4 %
Platelets: 179 10*3/uL (ref 140–400)
RBC: 4.42 10*6/uL (ref 4.20–5.80)
RDW: 12.8 % (ref 11.0–15.0)
Total Lymphocyte: 31.5 %
WBC: 6.2 10*3/uL (ref 3.8–10.8)

## 2019-02-27 LAB — COMPLETE METABOLIC PANEL WITH GFR
AG Ratio: 2.1 (calc) (ref 1.0–2.5)
ALT: 33 U/L (ref 9–46)
AST: 36 U/L — ABNORMAL HIGH (ref 10–35)
Albumin: 4.5 g/dL (ref 3.6–5.1)
Alkaline phosphatase (APISO): 85 U/L (ref 35–144)
BUN: 11 mg/dL (ref 7–25)
CO2: 29 mmol/L (ref 20–32)
Calcium: 9.9 mg/dL (ref 8.6–10.3)
Chloride: 97 mmol/L — ABNORMAL LOW (ref 98–110)
Creat: 0.96 mg/dL (ref 0.70–1.33)
GFR, Est African American: 106 mL/min/{1.73_m2} (ref >=60)
GFR, Est Non African American: 92 mL/min/{1.73_m2} (ref >=60)
Globulin: 2.1 g/dL (calc) (ref 1.9–3.7)
Glucose, Bld: 154 mg/dL — ABNORMAL HIGH (ref 65–99)
Potassium: 3.4 mmol/L — ABNORMAL LOW (ref 3.5–5.3)
Sodium: 137 mmol/L (ref 135–146)
Total Bilirubin: 0.5 mg/dL (ref 0.2–1.2)
Total Protein: 6.6 g/dL (ref 6.1–8.1)

## 2019-02-27 NOTE — Progress Notes (Signed)
Labs are stable.  Glucose is elevated.  LFTs are mildly elevated most likely due to use of statins.  Please forward a copy of the labs to his PCP.

## 2019-03-09 MED FILL — SIMVASTATIN 20 MG TABLET: 20 | 90 days supply | Qty: 90 | Fill #1

## 2019-03-09 MED FILL — HYDROXYCHLOROQUINE SULFATE: 200 | 30 days supply | Qty: 60 | Fill #1

## 2019-03-13 ENCOUNTER — Other Ambulatory Visit: Payer: Self-pay | Admitting: Student in an Organized Health Care Education/Training Program

## 2019-03-13 DIAGNOSIS — M47816 Spondylosis without myelopathy or radiculopathy, lumbar region: Secondary | ICD-10-CM

## 2019-03-13 MED FILL — HYDROCODON-APAP 7.5-325: 7.5-325 | 23 days supply | Qty: 45 | Fill #0

## 2019-04-01 ENCOUNTER — Encounter (HOSPITAL_COMMUNITY): Payer: Self-pay

## 2019-04-02 ENCOUNTER — Other Ambulatory Visit: Payer: Self-pay | Admitting: Student in an Organized Health Care Education/Training Program

## 2019-04-02 MED FILL — metFORMIN HCL 1000 MG TABS: 1000 | 90 days supply | Qty: 180 | Fill #3

## 2019-04-02 MED FILL — TADALAFIL 20 MG TABS: 20 | 50 days supply | Qty: 10 | Fill #0

## 2019-04-02 MED FILL — LOSARTAN POTASSIUM 100 MG T: 100 | 30 days supply | Qty: 30 | Fill #3

## 2019-04-02 MED FILL — CYCLOBENZAPRINE 10 MG TAB: 10 | 90 days supply | Qty: 90 | Fill #1

## 2019-04-14 ENCOUNTER — Other Ambulatory Visit: Payer: Self-pay | Admitting: Student in an Organized Health Care Education/Training Program

## 2019-04-14 DIAGNOSIS — M47816 Spondylosis without myelopathy or radiculopathy, lumbar region: Secondary | ICD-10-CM

## 2019-04-14 MED FILL — HYDROXYCHLOROQUINE SULFATE: 200 | 30 days supply | Qty: 60 | Fill #2

## 2019-04-14 MED FILL — HYDROCODON-APAP 7.5-325: 7.5-325 | 22 days supply | Qty: 45 | Fill #0

## 2019-04-20 ENCOUNTER — Ambulatory Visit: Payer: 59 | Attending: Internal Medicine

## 2019-04-20 DIAGNOSIS — Z20828 Contact with and (suspected) exposure to other viral communicable diseases: Secondary | ICD-10-CM | POA: Diagnosis not present

## 2019-04-20 DIAGNOSIS — Z20822 Contact with and (suspected) exposure to covid-19: Secondary | ICD-10-CM

## 2019-04-22 LAB — NOVEL CORONAVIRUS, NAA: SARS-CoV-2, NAA: NOT DETECTED

## 2019-04-23 ENCOUNTER — Other Ambulatory Visit: Payer: 59

## 2019-05-07 ENCOUNTER — Telehealth: Payer: Self-pay | Admitting: Student in an Organized Health Care Education/Training Program

## 2019-05-07 NOTE — Telephone Encounter (Signed)
Pt reporting head pain/sinus pressure X 1 week , now having yellow and green  Mucous.  Pt wanting medication.

## 2019-05-07 NOTE — Telephone Encounter (Signed)
Called pt - stated he had a head cold a few weeks ago; had covid test done the week of 12/28 which was negative. Now thinks he might have a sinus infection - yellow/greenish mucous and pressure pain under right cheek. Requesting an abx ;I asked if his doctor had prescribed abx before for this problem, stated he does not think so; informed he might have to schedule an in-person appt. Stated there was a covid outbreak at his job;prefers not to come in. Thanks

## 2019-05-07 NOTE — Telephone Encounter (Signed)
Please have him schedule a telehealth [facility] appointment. Thanks!

## 2019-05-07 NOTE — Telephone Encounter (Signed)
Called pt - agreeable to a telehealth appt. Appt scheduled tomorrow,Friday, @ 1315 PM. Informed some one will call between 1300 - 1500 PM.

## 2019-05-08 ENCOUNTER — Ambulatory Visit (INDEPENDENT_AMBULATORY_CARE_PROVIDER_SITE_OTHER): Payer: 59 | Admitting: Internal Medicine

## 2019-05-08 ENCOUNTER — Other Ambulatory Visit: Payer: Self-pay

## 2019-05-08 DIAGNOSIS — B9689 Other specified bacterial agents as the cause of diseases classified elsewhere: Secondary | ICD-10-CM

## 2019-05-08 DIAGNOSIS — E119 Type 2 diabetes mellitus without complications: Secondary | ICD-10-CM

## 2019-05-08 DIAGNOSIS — J019 Acute sinusitis, unspecified: Secondary | ICD-10-CM | POA: Diagnosis not present

## 2019-05-08 MED ORDER — AMOXICILLIN-POT CLAVULANATE 875-125 MG PO TABS: 1.0000 | ORAL_TABLET | Freq: Two times a day (BID) | ORAL | 0 refills | Status: AC

## 2019-05-08 MED FILL — AMOX-CLAV 875-125 MG TABLET: 875-125 | 7 days supply | Qty: 14 | Fill #0

## 2019-05-08 NOTE — Assessment & Plan Note (Signed)
Patient with 10 days of sinus pain. It was initially bilateral. After 3 to 4 days and had improved however now his pain has returned and is worse. It is primarily on the right side. Describes it as being over his cheekbone. Describes both pain and pressure. He has had no fevers, chills, or other systemic signs of infection. He has been using Mucinex which helps with the discharge but does not help with the pain/pressure. He has had sinus infections in the past however has not had one in over a year. He has never had any reactions to antibiotics.  I reviewed his chart and he does have underlying diabetes however his A1c is well controlled.  A/P: - Discussed symptomatic management with nasal lavage and Mucinex. - Given his double sickness phenomenon and unilateral pain will send out Augmentin 875 BID for seven days. - He will call if his symptoms do not improve.

## 2019-05-08 NOTE — Progress Notes (Signed)
Internal Medicine Clinic Attending  Case discussed with Dr. Helberg at the time of the visit.  We reviewed the resident's history and exam and pertinent patient test results.  I agree with the assessment, diagnosis, and plan of care documented in the resident's note.  Kalika Smay, M.D., Ph.D.  

## 2019-05-08 NOTE — Progress Notes (Signed)
   CC: Sinus pain  This is a telephone encounter between Adam [name] and Adam [name] on 05/08/2019 for sinus pain. The visit was conducted with the patient located at home and [facility]. The patient's identity was confirmed using their DOB and current address. The patient has consented to being evaluated through a telephone encounter and understands the associated risks (an examination cannot be done and the patient may need to come in for an appointment) / benefits (allows the patient to remain at home, decreasing exposure to coronavirus). I personally spent 6 minutes on medical discussion.   HPI:  [name] is a 49 y.o. with PMH as below.   Please see A&P for assessment of the patient's acute and chronic medical conditions.   Past Medical History:  Diagnosis Date  . Anxiety   . Arthritis    lower back, hands  . Deviated septum    nasal turbinate hypertrophy  . Hypertension    states under control with meds., has been on medication since age 28  . Insulin dependent diabetes mellitus    type 2 DM  . Morbid obesity ([place])   . Sleep apnea    uses CPAP "most of the time", per pt.   Review of Systems:  Performed and all others negative.  Assessment & Plan:   See Encounters Tab for problem based charting.  Patient discussed with Dr. Rebeca Alert

## 2019-05-12 ENCOUNTER — Ambulatory Visit: Payer: 59 | Admitting: Rheumatology

## 2019-05-13 ENCOUNTER — Other Ambulatory Visit: Payer: Self-pay | Admitting: Student in an Organized Health Care Education/Training Program

## 2019-05-13 MED FILL — PIOGLITAZONE HCL 45 MG TAB: 45 | 90 days supply | Qty: 90 | Fill #0

## 2019-05-13 MED FILL — LOSARTAN POTASSIUM 100 MG T: 100 | 90 days supply | Qty: 90 | Fill #0

## 2019-05-13 MED FILL — AMLODIPINE BESYLATE 5 MG TA: 5 | 90 days supply | Qty: 90 | Fill #0

## 2019-05-13 MED FILL — HYDROCHLOROTHIAZIDE 25 MG T: 25 | 90 days supply | Qty: 90 | Fill #0

## 2019-05-13 MED FILL — DULOXETINE HCL 60 MG CPEP: 60 | 90 days supply | Qty: 90 | Fill #0

## 2019-06-02 ENCOUNTER — Other Ambulatory Visit: Payer: Self-pay | Admitting: Student in an Organized Health Care Education/Training Program

## 2019-06-02 DIAGNOSIS — M47816 Spondylosis without myelopathy or radiculopathy, lumbar region: Secondary | ICD-10-CM

## 2019-06-02 MED FILL — HYDROCODON-APAP 7.5-325: 7.5-325 | 23 days supply | Qty: 45 | Fill #0

## 2019-06-02 MED FILL — TADALAFIL 20 MG TABS: 20 | 30 days supply | Qty: 10 | Fill #0

## 2019-06-22 ENCOUNTER — Other Ambulatory Visit: Payer: Self-pay | Admitting: Student in an Organized Health Care Education/Training Program

## 2019-06-22 MED FILL — SIMVASTATIN 20 MG TABLET: 20 | 90 days supply | Qty: 90 | Fill #0

## 2019-06-22 MED FILL — CYCLOBENZAPRINE HCL 10 MG T: 10 | 90 days supply | Qty: 90 | Fill #0

## 2019-06-22 MED FILL — metFORMIN HCL 1000 MG TABS: 1000 | 90 days supply | Qty: 180 | Fill #0

## 2019-06-22 NOTE — Progress Notes (Deleted)
Office Visit Note  Patient: Adam [name]             Date of Birth: [birth year]           MRN: [id number]             PCP: Axel Filler, MD Referring: Axel Filler Visit Date: 06/24/2019 Occupation: @GUAROCC @  Subjective:  No chief complaint on file.   History of Present Illness: Adam [name] is a 49 y.o. male ***   Activities of Daily Living:  Patient reports morning stiffness for *** {minute/hour:19697}.   Patient {ACTIONS;DENIES/REPORTS:21021675::"Denies"} nocturnal pain.  Difficulty dressing/grooming: {ACTIONS;DENIES/REPORTS:21021675::"Denies"} Difficulty climbing stairs: {ACTIONS;DENIES/REPORTS:21021675::"Denies"} Difficulty getting out of chair: {ACTIONS;DENIES/REPORTS:21021675::"Denies"} Difficulty using hands for taps, buttons, cutlery, and/or writing: {ACTIONS;DENIES/REPORTS:21021675::"Denies"}  No Rheumatology ROS completed.   PMFS History:  Patient Active Problem List   Diagnosis Date Noted  . Acute bacterial sinusitis 05/08/2019  . Synovitis of hand 11/10/2018  . Hearing loss 11/12/2016  . Obesity, Class III, BMI 40-49.9 (morbid obesity) ([place]) 11/12/2016  . Osteoarthritis of lumbar spine 04/09/2016  . Tendinopathy of left rotator cuff 12/30/2015  . Obstructive sleep apnea 02/28/2015  . Type 2 diabetes mellitus with other specified complication ([place]) 0000000  . Erectile dysfunction 01/06/2015  . Healthcare maintenance 01/06/2015  . Essential hypertension 01/06/2015    Past Medical History:  Diagnosis Date  . Anxiety   . Arthritis    lower back, hands  . Deviated septum    nasal turbinate hypertrophy  . Hypertension    states under control with meds., has been on medication since age 31  . Insulin dependent diabetes mellitus    type 2 DM  . Morbid obesity ([place])   . Sleep apnea    uses CPAP "most of the time", per pt.    Family History  Problem Relation Age of Onset  . Hyperlipidemia Mother   . Aneurysm Mother   .  Hypertension Mother   . Diabetes Father   . Hyperlipidemia Father   . Hypertension Father   . Healthy Daughter   . Healthy Daughter    Past Surgical History:  Procedure Laterality Date  . CARPAL TUNNEL RELEASE Right 07/2014  . LAPAROSCOPIC GASTRIC SLEEVE RESECTION     Dr. Excell Seltzer 09-09-17  . LAPAROSCOPIC GASTRIC SLEEVE RESECTION N/A 09/09/2017   Procedure: LAPAROSCOPIC GASTRIC SLEEVE RESECTION  AND ERAS PATHWAY;  Surgeon: Excell Seltzer, MD;  Location: [facility];  Service: General;  Laterality: N/A;  . LUMBAR FUSION    . NASAL SEPTOPLASTY W/ TURBINOPLASTY Bilateral 03/22/2017   Procedure: NASAL SEPTOPLASTY WITH  BILATERAL INFERIOR   TURBINATE REDUCTION;  Surgeon: Jerrell Belfast, MD;  Location: [facility];  Service: ENT;  Laterality: Bilateral;  . SHOULDER ARTHROSCOPY Left    x 2  . TONSILLECTOMY AND ADENOIDECTOMY    . TYMPANOSTOMY TUBE PLACEMENT Bilateral   . WISDOM TOOTH EXTRACTION     Social History   Social History Narrative  . Not on file   Immunization History  Administered Date(s) Administered  . Influenza Inj Mdck Quad Pf 01/10/2018  . Influenza,inj,Quad PF,6+ Mos 01/06/2015, 01/07/2017, 01/22/2019  . Influenza-Unspecified 03/10/2016  . Pneumococcal Polysaccharide-23 01/06/2015  . Tdap 12/09/2010     Objective: Vital Signs: There were no vitals taken for this visit.   Physical Exam   Musculoskeletal Exam: ***  CDAI Exam: CDAI Score: -- Patient Global: --; Provider Global: -- Swollen: --; Tender: -- Joint Exam 06/24/2019   No joint exam has been documented for this visit  There is currently no information documented on the homunculus. Go to the Rheumatology activity and complete the homunculus joint exam.  Investigation: No additional findings.  Imaging: No results found.  Recent Labs: Lab Results  Component Value Date   WBC 6.2 02/26/2019   HGB 14.3 02/26/2019   PLT 179 02/26/2019   NA 137 02/26/2019   K 3.4 (L) 02/26/2019   CL 97 (L)  02/26/2019   CO2 29 02/26/2019   GLUCOSE 154 (H) 02/26/2019   BUN 11 02/26/2019   CREATININE 0.96 02/26/2019   BILITOT 0.5 02/26/2019   ALKPHOS 87 11/10/2018   AST 36 (H) 02/26/2019   ALT 33 02/26/2019   PROT 6.6 02/26/2019   ALBUMIN 4.7 11/10/2018   CALCIUM 9.9 02/26/2019   GFRAA 106 02/26/2019   QFTBGOLDPLUS NEGATIVE 12/25/2018    Speciality Comments: No specialty comments available.  Procedures:  No procedures performed Allergies: Patient has no known allergies.   Assessment / Plan:     Visit Diagnoses: No diagnosis found.  Orders: No orders of the defined types were placed in this encounter.  No orders of the defined types were placed in this encounter.   Face-to-face time spent with patient was *** minutes. Greater than 50% of time was spent in counseling and coordination of care.  Follow-Up Instructions: No follow-ups on file.   Earnestine Mealing, CMA  Note - This record has been created using Editor, commissioning.  Chart creation errors have been sought, but may not always  have been located. Such creation errors do not reflect on  the standard of medical care.

## 2019-06-24 ENCOUNTER — Ambulatory Visit: Payer: 59 | Admitting: Rheumatology

## 2019-07-04 ENCOUNTER — Ambulatory Visit: Payer: 59 | Attending: Internal Medicine

## 2019-07-04 DIAGNOSIS — Z23 Encounter for immunization: Secondary | ICD-10-CM

## 2019-07-04 NOTE — Progress Notes (Signed)
   Covid-19 Vaccination Clinic  Name:  Darroll Olesen    MRN: [id number] DOB: 07-20-1970  07/04/2019  Mr. Rahl was observed post Covid-19 immunization for 15 minutes without incident. He was provided with Vaccine Information Sheet and instruction to access the V-Safe system.   Mr. Kuyper was instructed to call 911 with any severe reactions post vaccine: Marland Kitchen Difficulty breathing  . Swelling of face and throat  . A fast heartbeat  . A bad rash all over body  . Dizziness and weakness   Immunizations Administered    Name Date Dose VIS Date Route   Pfizer COVID-19 Vaccine 07/04/2019 11:24 AM 0.3 mL 04/03/2019 Intramuscular   Manufacturer: [place]   Lot: [id number]   [place]: [id number]

## 2019-07-09 ENCOUNTER — Other Ambulatory Visit: Payer: Self-pay | Admitting: *Deleted

## 2019-07-09 DIAGNOSIS — M47816 Spondylosis without myelopathy or radiculopathy, lumbar region: Secondary | ICD-10-CM

## 2019-07-09 MED ORDER — HYDROCODONE-ACETAMINOPHEN 7.5-325 MG PO TABS: 1.0000 | ORAL_TABLET | Freq: Every day | ORAL | 0 refills | Status: DC | PRN

## 2019-07-09 MED FILL — HYDROCODON-APAP 7.5-325: 7.5-325 | 45 days supply | Qty: 45 | Fill #0

## 2019-07-09 NOTE — Telephone Encounter (Signed)
Last rx written 06/02/19.  Next OV has not been scheduled. UDS 02/10/18.

## 2019-07-28 ENCOUNTER — Ambulatory Visit: Payer: 59 | Attending: Internal Medicine

## 2019-07-28 DIAGNOSIS — Z23 Encounter for immunization: Secondary | ICD-10-CM

## 2019-07-28 NOTE — Progress Notes (Signed)
   Covid-19 Vaccination Clinic  Name:  Adam [name]    MRN: [id number] DOB: 1970/10/24  07/28/2019  Mr. Adam [name] was observed post Covid-19 immunization for 15 minutes without incident. He was provided with Vaccine Information Sheet and instruction to access the V-Safe system.   Mr. Adam [name] was instructed to call 911 with any severe reactions post vaccine: Adam Kitchen Difficulty breathing  . Swelling of face and throat  . A fast heartbeat  . A bad rash all over body  . Dizziness and weakness   Immunizations Administered    Name Date Dose VIS Date Route   Pfizer COVID-19 Vaccine 07/28/2019 11:00 AM 0.3 mL 04/03/2019 Intramuscular   Manufacturer: [employer]   Lot: [id number]   [name]: [id number]

## 2019-08-05 ENCOUNTER — Other Ambulatory Visit: Payer: Self-pay | Admitting: Student in an Organized Health Care Education/Training Program

## 2019-08-06 MED FILL — TADALAFIL 20 MG TABS: 20 | 30 days supply | Qty: 6 | Fill #0

## 2019-08-06 NOTE — Telephone Encounter (Signed)
Attemepted to notify patient's wife that refill has been sent but there was no answer and "call cannot be completed at this time." L. Celester Lech, BSN, RN-BC

## 2019-08-06 NOTE — Telephone Encounter (Signed)
Patient's wife called, states they are heading out of town and requests refill on cialis today. Hubbard Hartshorn, BSN, RN-BC

## 2019-08-11 NOTE — Progress Notes (Deleted)
Office Visit Note  Patient: Adam [name]             Date of Birth: 1971/02/22           MRN: [id number]             PCP: Axel Filler, MD Referring: Axel Filler,* Visit Date: 08/18/2019 Occupation: @GUAROCC @  Subjective:  No chief complaint on file.   History of Present Illness: Kirsten Lafarga is a 49 y.o. male ***   Activities of Daily Living:  Patient reports morning stiffness for *** {minute/hour:19697}.   Patient {ACTIONS;DENIES/REPORTS:21021675::"Denies"} nocturnal pain.  Difficulty dressing/grooming: {ACTIONS;DENIES/REPORTS:21021675::"Denies"} Difficulty climbing stairs: {ACTIONS;DENIES/REPORTS:21021675::"Denies"} Difficulty getting out of chair: {ACTIONS;DENIES/REPORTS:21021675::"Denies"} Difficulty using hands for taps, buttons, cutlery, and/or writing: {ACTIONS;DENIES/REPORTS:21021675::"Denies"}  No Rheumatology ROS completed.   PMFS History:  Patient Active Problem List   Diagnosis Date Noted  . Acute bacterial sinusitis 05/08/2019  . Synovitis of hand 11/10/2018  . Hearing loss 11/12/2016  . Obesity, Class III, BMI 40-49.9 (morbid obesity) ([place]) 11/12/2016  . Osteoarthritis of lumbar spine 04/09/2016  . Tendinopathy of left rotator cuff 12/30/2015  . Obstructive sleep apnea 02/28/2015  . Type 2 diabetes mellitus with other specified complication ([place]) 0000000  . Erectile dysfunction 01/06/2015  . Healthcare maintenance 01/06/2015  . Essential hypertension 01/06/2015    Past Medical History:  Diagnosis Date  . Anxiety   . Arthritis    lower back, hands  . Deviated septum    nasal turbinate hypertrophy  . Hypertension    states under control with meds., has been on medication since age 42  . Insulin dependent diabetes mellitus    type 2 DM  . Morbid obesity ([place])   . Sleep apnea    uses CPAP "most of the time", per pt.    Family History  Problem Relation Age of Onset  . Hyperlipidemia Mother   . Aneurysm Mother   .  Hypertension Mother   . Diabetes Father   . Hyperlipidemia Father   . Hypertension Father   . Healthy Daughter   . Healthy Daughter    Past Surgical History:  Procedure Laterality Date  . CARPAL TUNNEL RELEASE Right 07/2014  . LAPAROSCOPIC GASTRIC SLEEVE RESECTION     Dr. Excell Seltzer 09-09-17  . LAPAROSCOPIC GASTRIC SLEEVE RESECTION N/A 09/09/2017   Procedure: LAPAROSCOPIC GASTRIC SLEEVE RESECTION  AND ERAS PATHWAY;  Surgeon: Excell Seltzer, MD;  [facility];  Service: General;  Laterality: N/A;  . LUMBAR FUSION    . NASAL SEPTOPLASTY W/ TURBINOPLASTY Bilateral 03/22/2017   Procedure: NASAL SEPTOPLASTY WITH  BILATERAL INFERIOR   TURBINATE REDUCTION;  Surgeon: Jerrell Belfast, MD;  Location: [facility];  Service: ENT;  Laterality: Bilateral;  . SHOULDER ARTHROSCOPY Left    x 2  . TONSILLECTOMY AND ADENOIDECTOMY    . TYMPANOSTOMY TUBE PLACEMENT Bilateral   . WISDOM TOOTH EXTRACTION     Social History   Social History Narrative  . Not on file   Immunization History  Administered Date(s) Administered  . Influenza Inj Mdck Quad Pf 01/10/2018  . Influenza,inj,Quad PF,6+ Mos 01/06/2015, 01/07/2017, 01/22/2019  . Influenza-Unspecified 03/10/2016  . PFIZER SARS-COV-2 Vaccination 07/04/2019, 07/28/2019  . Pneumococcal Polysaccharide-23 01/06/2015  . Tdap 12/09/2010     Objective: Vital Signs: There were no vitals taken for this visit.   Physical Exam   Musculoskeletal Exam: ***  CDAI Exam: CDAI Score: -- Patient Global: --; Provider Global: -- Swollen: --; Tender: -- Joint Exam 08/18/2019   No joint exam  has been documented for this visit   There is currently no information documented on the homunculus. Go to the Rheumatology activity and complete the homunculus joint exam.  Investigation: No additional findings.  Imaging: No results found.  Recent Labs: Lab Results  Component Value Date   WBC 6.2 02/26/2019   HGB 14.3 02/26/2019   PLT 179 02/26/2019   NA 137  02/26/2019   K 3.4 (L) 02/26/2019   CL 97 (L) 02/26/2019   CO2 29 02/26/2019   GLUCOSE 154 (H) 02/26/2019   BUN 11 02/26/2019   CREATININE 0.96 02/26/2019   BILITOT 0.5 02/26/2019   ALKPHOS 87 11/10/2018   AST 36 (H) 02/26/2019   ALT 33 02/26/2019   PROT 6.6 02/26/2019   ALBUMIN 4.7 11/10/2018   CALCIUM 9.9 02/26/2019   GFRAA 106 02/26/2019   QFTBGOLDPLUS NEGATIVE 12/25/2018    Speciality Comments: No specialty comments available.  Procedures:  No procedures performed Allergies: Patient has no known allergies.   Assessment / Plan:     Visit Diagnoses: No diagnosis found.  Orders: No orders of the defined types were placed in this encounter.  No orders of the defined types were placed in this encounter.   Face-to-face time spent with patient was *** minutes. Greater than 50% of time was spent in counseling and coordination of care.  Follow-Up Instructions: No follow-ups on file.   Ofilia Neas, PA-C  Note - This record has been created using Dragon software.  Chart creation errors have been sought, but may not always  have been located. Such creation errors do not reflect on  the standard of medical care.

## 2019-08-14 ENCOUNTER — Other Ambulatory Visit: Payer: Self-pay | Admitting: Student in an Organized Health Care Education/Training Program

## 2019-08-14 MED FILL — DULOXETINE HCL 60 MG CPEP: 60 | 90 days supply | Qty: 90 | Fill #1

## 2019-08-14 MED FILL — AMLODIPINE BESYLATE 5 MG TA: 5 | 90 days supply | Qty: 90 | Fill #1

## 2019-08-14 MED FILL — HYDROCHLOROTHIAZIDE 25 MG T: 25 | 90 days supply | Qty: 90 | Fill #1

## 2019-08-17 ENCOUNTER — Other Ambulatory Visit: Payer: Self-pay | Admitting: Student in an Organized Health Care Education/Training Program

## 2019-08-17 MED FILL — LOSARTAN POTASSIUM 100 MG T: 100 | 90 days supply | Qty: 90 | Fill #0

## 2019-08-18 ENCOUNTER — Ambulatory Visit: Payer: 59 | Admitting: Rheumatology

## 2019-08-18 ENCOUNTER — Other Ambulatory Visit: Payer: Self-pay | Admitting: Rheumatology

## 2019-08-18 DIAGNOSIS — I10 Essential (primary) hypertension: Secondary | ICD-10-CM

## 2019-08-18 DIAGNOSIS — M67912 Unspecified disorder of synovium and tendon, left shoulder: Secondary | ICD-10-CM

## 2019-08-18 DIAGNOSIS — G4733 Obstructive sleep apnea (adult) (pediatric): Secondary | ICD-10-CM

## 2019-08-18 DIAGNOSIS — M47816 Spondylosis without myelopathy or radiculopathy, lumbar region: Secondary | ICD-10-CM

## 2019-08-18 DIAGNOSIS — M0579 Rheumatoid arthritis with rheumatoid factor of multiple sites without organ or systems involvement: Secondary | ICD-10-CM

## 2019-08-18 DIAGNOSIS — E1169 Type 2 diabetes mellitus with other specified complication: Secondary | ICD-10-CM

## 2019-08-18 DIAGNOSIS — M2241 Chondromalacia patellae, right knee: Secondary | ICD-10-CM

## 2019-08-18 DIAGNOSIS — H9193 Unspecified hearing loss, bilateral: Secondary | ICD-10-CM

## 2019-08-18 DIAGNOSIS — Z79899 Other long term (current) drug therapy: Secondary | ICD-10-CM

## 2019-08-18 MED ORDER — HYDROXYCHLOROQUINE SULFATE 200 MG PO TABS: 200.0000 mg | ORAL_TABLET | Freq: Two times a day (BID) | ORAL | 0 refills | Status: DC

## 2019-08-18 NOTE — Telephone Encounter (Signed)
Last Visit: 02/26/2019 Next Visit: 08/20/2019 Labs: 02/26/2019 Labs are stable. Glucose is elevated. LFTs are mildly elevated most likely due to use of statins. Eye exam: no eye exam on file.   Spoke with patient and advised we will update labs at appointment on 08/20/2019 and he will call the eye doctor to fax plaquenil eye exam results.   Okay to refill 30 day supply of plaquenil?

## 2019-08-18 NOTE — Telephone Encounter (Signed)
ok 

## 2019-08-18 NOTE — Progress Notes (Signed)
Office Visit Note  Patient: Adam [name]             Date of Birth: 1971-01-23           MRN: [id number]             PCP: Axel Filler, MD Referring: Axel Filler,* Visit Date: 08/20/2019 Occupation: @GUAROCC @  Subjective:  Medication monitoring   History of Present Illness: Marv Bockus is a 49 y.o. male with history of seropositive rheumatoid arthritis.  Patient is taking Plaquenil 200 mg 1 tablet by mouth twice daily.  He reports that he had Plaquenil for 1 month while he received the COVID-19 vaccinations.  He states that he did not develop any increased joint pain or joint swelling while holding Plaquenil.  He has resumed PLQ and is tolerating it without any side effects.  He denies any recent rheumatoid arthritis flares.  He continues to have tenderness intermittently in the right second and third MCP joints.  He denies any joint swelling at this time.  He denies any new concerns.  Activities of Daily Living:  Patient reports morning stiffness for 30 minutes.   Patient Denies nocturnal pain.  Difficulty dressing/grooming: Denies Difficulty climbing stairs: Denies Difficulty getting out of chair: Denies Difficulty using hands for taps, buttons, cutlery, and/or writing: Denies  Review of Systems  Constitutional: Negative for fatigue.  HENT: Positive for mouth dryness. Negative for mouth sores and nose dryness.   Eyes: Negative for itching and dryness.  Respiratory: Negative for shortness of breath and difficulty breathing.   Cardiovascular: Negative for chest pain and palpitations.  Gastrointestinal: Negative for blood in stool, constipation and diarrhea.  Endocrine: Negative for increased urination.  Genitourinary: Negative for difficulty urinating.  Musculoskeletal: Positive for arthralgias, joint pain and morning stiffness. Negative for joint swelling, myalgias, muscle tenderness and myalgias.  Skin: Negative for rash and redness.    Allergic/Immunologic: Negative for susceptible to infections.  Neurological: Negative for dizziness, numbness, headaches, memory loss and weakness.  Hematological: Negative for bruising/bleeding tendency.  Psychiatric/Behavioral: Negative for confusion.    PMFS History:  Patient Active Problem List   Diagnosis Date Noted  . Acute bacterial sinusitis 05/08/2019  . Synovitis of hand 11/10/2018  . Hearing loss 11/12/2016  . Obesity, Class III, BMI 40-49.9 (morbid obesity) ([place]) 11/12/2016  . Osteoarthritis of lumbar spine 04/09/2016  . Tendinopathy of left rotator cuff 12/30/2015  . Obstructive sleep apnea 02/28/2015  . Type 2 diabetes mellitus with other specified complication ([place]) 0000000  . Erectile dysfunction 01/06/2015  . Healthcare maintenance 01/06/2015  . Essential hypertension 01/06/2015    Past Medical History:  Diagnosis Date  . Anxiety   . Arthritis    lower back, hands  . Deviated septum    nasal turbinate hypertrophy  . Hypertension    states under control with meds., has been on medication since age 52  . Insulin dependent diabetes mellitus    type 2 DM  . Morbid obesity ([place])   . Sleep apnea    uses CPAP "most of the time", per pt.    Family History  Problem Relation Age of Onset  . Hyperlipidemia Mother   . Aneurysm Mother   . Hypertension Mother   . Diabetes Father   . Hyperlipidemia Father   . Hypertension Father   . Healthy Daughter   . Healthy Daughter    Past Surgical History:  Procedure Laterality Date  . CARPAL TUNNEL RELEASE Right 07/2014  . LAPAROSCOPIC GASTRIC  SLEEVE RESECTION     Dr. Excell Seltzer 09-09-17  . LAPAROSCOPIC GASTRIC SLEEVE RESECTION N/A 09/09/2017   Procedure: LAPAROSCOPIC GASTRIC SLEEVE RESECTION  AND ERAS PATHWAY;  Surgeon: Excell Seltzer, MD;  Location: [facility];  Service: General;  Laterality: N/A;  . LUMBAR FUSION    . NASAL SEPTOPLASTY W/ TURBINOPLASTY Bilateral 03/22/2017   Procedure: NASAL SEPTOPLASTY WITH   BILATERAL INFERIOR   TURBINATE REDUCTION;  Surgeon: Jerrell Belfast, MD;  Location: [facility];  Service: ENT;  Laterality: Bilateral;  . SHOULDER ARTHROSCOPY Left    x 2  . TONSILLECTOMY AND ADENOIDECTOMY    . TYMPANOSTOMY TUBE PLACEMENT Bilateral   . WISDOM TOOTH EXTRACTION     Social History   Social History Narrative  . Not on file   Immunization History  Administered Date(s) Administered  . Influenza Inj Mdck Quad Pf 01/10/2018  . Influenza,inj,Quad PF,6+ Mos 01/06/2015, 01/07/2017, 01/22/2019  . Influenza-Unspecified 03/10/2016  . PFIZER SARS-COV-2 Vaccination 07/04/2019, 07/28/2019  . Pneumococcal Polysaccharide-23 01/06/2015  . Tdap 12/09/2010     Objective: Vital Signs: BP 126/76 (BP Location: Left Arm, Patient Position: Sitting, Cuff Size: Normal)   Pulse 83   Resp 16   Ht 6' (1.829 m)   Wt 267 lb 3.2 oz (121.2 kg)   BMI 36.24 kg/m    Physical Exam Vitals and nursing note reviewed.  Constitutional:      Appearance: He is well-developed.  HENT:     Head: Normocephalic and atraumatic.  Eyes:     Conjunctiva/sclera: Conjunctivae normal.     Pupils: Pupils are equal, round, and reactive to light.  Pulmonary:     Effort: Pulmonary effort is normal.  Abdominal:     General: Bowel sounds are normal.     Palpations: Abdomen is soft.  Musculoskeletal:     Cervical back: Normal range of motion and neck supple.  Skin:    General: Skin is warm and dry.     Capillary Refill: Capillary refill takes less than 2 seconds.  Neurological:     Mental Status: He is alert and oriented to person, place, and time.  Psychiatric:        Behavior: Behavior normal.      Musculoskeletal Exam: C-spine, thoracic spine, and lumbar spine good ROM.  No midline spinal tenderness.  Shoulder joints, elbow joints, wrist joints, MCPs, PIPs, and DIPs good ROM with no synovitis.  Complete fist formation bilaterally.  Tenderness of the right 2nd and 3rd MCP joints.  Hip joints, knee joints,  ankle joints good ROM with no discomfort.  No warmth or effusion of knee joints.  No tenderness or swelling of ankle joints.   CDAI Exam: CDAI Score: 3  Patient Global: 5 mm; Provider Global: 5 mm Swollen: 0 ; Tender: 2  Joint Exam 08/20/2019      Right  Left  MCP 2   Tender     MCP 3   Tender        Investigation: No additional findings.  Imaging: No results found.  Recent Labs: Lab Results  Component Value Date   WBC 6.2 02/26/2019   HGB 14.3 02/26/2019   PLT 179 02/26/2019   NA 137 02/26/2019   K 3.4 (L) 02/26/2019   CL 97 (L) 02/26/2019   CO2 29 02/26/2019   GLUCOSE 154 (H) 02/26/2019   BUN 11 02/26/2019   CREATININE 0.96 02/26/2019   BILITOT 0.5 02/26/2019   ALKPHOS 87 11/10/2018   AST 36 (H) 02/26/2019   ALT 33  02/26/2019   PROT 6.6 02/26/2019   ALBUMIN 4.7 11/10/2018   CALCIUM 9.9 02/26/2019   GFRAA 106 02/26/2019   QFTBGOLDPLUS NEGATIVE 12/25/2018    Speciality Comments: No specialty comments available.  Procedures:  No procedures performed Allergies: Patient has no known allergies.   Assessment / Plan:     Visit Diagnoses: Rheumatoid arthritis involving multiple sites with positive rheumatoid factor ([place]): He has no synovitis on exam today.  He has tenderness of the right second and third MCP joints.  He has not had any recent rheumatoid arthritis flares.  He has noticed clinical improvement starting on Plaquenil 200 mg 1 tablet by mouth twice daily in October 2020.  He has been tolerating Plaquenil without any side effects.  He held Plaquenil for 1 month while receiving the COVID-19 vaccinations.  He did not develop any increased joint pain or inflammation while off Plaquenil.  He will continue taking Plaquenil 200 mg 1 tablet by mouth twice daily.  He does not need a refill at this time.  He was advised to notify us if he develops increased joint pain or joint swelling.  He will follow-up in the office in 5 months.  High risk medication use - Plaquenil  200 mg 1 tablet twice daily started in October 2020.  CBC and CMP were drawn on 02/26/2019.  He is due to update lab work today.  Orders for CBC and CMP were released.  According to the patient he had a baseline Plaquenil eye exam and will call his ophthalmologist to have the results faxed to our office.- Plan: CBC with Differential/Platelet, COMPLETE METABOLIC PANEL WITH GFR  Chondromalacia patellae, right knee: Doing well.  He has good ROM with no discomfort at this time.  No warmth or effusion was noted.   Tendinopathy of left rotator cuff: He has good ROM with no discomfort at this time.   Spondylosis of lumbar region without myelopathy or radiculopathy: He has not experiencing any increased discomfort in his lower back at this time. No symptoms of radiculopathy.  No midline spinal tenderness.  Other medical conditions are listed as follows:   Obesity, Class III, BMI 40-49.9 (morbid obesity) ([facility])  Bilateral hearing loss, unspecified hearing loss type  Obstructive sleep apnea  Essential hypertension  Type 2 diabetes mellitus with other specified complication, with long-term current use of insulin ([place])  Orders: Orders Placed This Encounter  Procedures  . CBC with Differential/Platelet  . COMPLETE METABOLIC PANEL WITH GFR   No orders of the defined types were placed in this encounter.     Follow-Up Instructions: Return in about 5 months (around 01/20/2020) for Rheumatoid arthritis.   Bo Merino, MD   I examined and evaluated the patient with Hazel Sams PA. The plan of care was discussed as noted above.  Bo Merino, MD  Note - This record has been created using Editor, commissioning.  Chart creation errors have been sought, but may not always  have been located. Such creation errors do not reflect on  the standard of medical care.

## 2019-08-18 NOTE — Telephone Encounter (Signed)
Patient requesting a refill on Plaquenil sent to [facility]. Patient has a return office visit scheduled for this Thursday 08/20/19.

## 2019-08-19 ENCOUNTER — Other Ambulatory Visit: Payer: Self-pay | Admitting: Student in an Organized Health Care Education/Training Program

## 2019-08-19 DIAGNOSIS — M47816 Spondylosis without myelopathy or radiculopathy, lumbar region: Secondary | ICD-10-CM

## 2019-08-19 MED FILL — PIOGLITAZONE HCL 45 MG TAB: 45 | 90 days supply | Qty: 90 | Fill #1

## 2019-08-19 NOTE — Telephone Encounter (Signed)
Last office visit:  05/08/19 ([place]) 11/10/18 (pcp) Last UDS: 10/21/20219 Last Refill: last written 07/09/19 #45 Next appt: none scheduled

## 2019-08-20 ENCOUNTER — Other Ambulatory Visit: Payer: Self-pay

## 2019-08-20 ENCOUNTER — Ambulatory Visit: Payer: 59 | Admitting: Rheumatology

## 2019-08-20 ENCOUNTER — Encounter: Payer: Self-pay | Admitting: Rheumatology

## 2019-08-20 VITALS — BP 126/76 | HR 83 | Resp 16 | Ht 72.0 in | Wt 267.2 lb

## 2019-08-20 DIAGNOSIS — M67912 Unspecified disorder of synovium and tendon, left shoulder: Secondary | ICD-10-CM | POA: Diagnosis not present

## 2019-08-20 DIAGNOSIS — M0579 Rheumatoid arthritis with rheumatoid factor of multiple sites without organ or systems involvement: Secondary | ICD-10-CM

## 2019-08-20 DIAGNOSIS — M2241 Chondromalacia patellae, right knee: Secondary | ICD-10-CM | POA: Diagnosis not present

## 2019-08-20 DIAGNOSIS — I10 Essential (primary) hypertension: Secondary | ICD-10-CM

## 2019-08-20 DIAGNOSIS — G4733 Obstructive sleep apnea (adult) (pediatric): Secondary | ICD-10-CM

## 2019-08-20 DIAGNOSIS — M47816 Spondylosis without myelopathy or radiculopathy, lumbar region: Secondary | ICD-10-CM | POA: Diagnosis not present

## 2019-08-20 DIAGNOSIS — Z79899 Other long term (current) drug therapy: Secondary | ICD-10-CM | POA: Diagnosis not present

## 2019-08-20 DIAGNOSIS — H9193 Unspecified hearing loss, bilateral: Secondary | ICD-10-CM | POA: Diagnosis not present

## 2019-08-20 DIAGNOSIS — Z794 Long term (current) use of insulin: Secondary | ICD-10-CM

## 2019-08-20 DIAGNOSIS — E1169 Type 2 diabetes mellitus with other specified complication: Secondary | ICD-10-CM

## 2019-08-20 LAB — CBC WITH DIFFERENTIAL/PLATELET
Absolute Monocytes: 490 cells/uL (ref 200–950)
Basophils Absolute: 17 cells/uL (ref 0–200)
Basophils Relative: 0.2 %
Eosinophils Absolute: 95 cells/uL (ref 15–500)
Eosinophils Relative: 1.1 %
HCT: 38.4 % — ABNORMAL LOW (ref 38.5–50.0)
Hemoglobin: 13.2 g/dL (ref 13.2–17.1)
Lymphs Abs: 1849 cells/uL (ref 850–3900)
MCH: 32.8 pg (ref 27.0–33.0)
MCHC: 34.4 g/dL (ref 32.0–36.0)
MCV: 95.3 fL (ref 80.0–100.0)
MPV: 10.6 fL (ref 7.5–12.5)
Monocytes Relative: 5.7 %
Neutro Abs: 6149 cells/uL (ref 1500–7800)
Neutrophils Relative %: 71.5 %
Platelets: 179 10*3/uL (ref 140–400)
RBC: 4.03 10*6/uL — ABNORMAL LOW (ref 4.20–5.80)
RDW: 13.4 % (ref 11.0–15.0)
Total Lymphocyte: 21.5 %
WBC: 8.6 10*3/uL (ref 3.8–10.8)

## 2019-08-20 LAB — COMPLETE METABOLIC PANEL WITH GFR
AG Ratio: 2.4 (calc) (ref 1.0–2.5)
ALT: 23 U/L (ref 9–46)
AST: 22 U/L (ref 10–35)
Albumin: 4.4 g/dL (ref 3.6–5.1)
Alkaline phosphatase (APISO): 75 U/L (ref 35–144)
BUN/Creatinine Ratio: 16 (calc) (ref 6–22)
BUN: 23 mg/dL (ref 7–25)
CO2: 28 mmol/L (ref 20–32)
Calcium: 8.9 mg/dL (ref 8.6–10.3)
Chloride: 99 mmol/L (ref 98–110)
Creat: 1.41 mg/dL — ABNORMAL HIGH (ref 0.70–1.33)
GFR, Est African American: 67 mL/min/{1.73_m2} (ref >=60)
GFR, Est Non African American: 58 mL/min/{1.73_m2} — ABNORMAL LOW (ref >=60)
Globulin: 1.8 g/dL (calc) — ABNORMAL LOW (ref 1.9–3.7)
Glucose, Bld: 113 mg/dL — ABNORMAL HIGH (ref 65–99)
Potassium: 3.4 mmol/L — ABNORMAL LOW (ref 3.5–5.3)
Sodium: 139 mmol/L (ref 135–146)
Total Bilirubin: 0.5 mg/dL (ref 0.2–1.2)
Total Protein: 6.2 g/dL (ref 6.1–8.1)

## 2019-08-20 MED FILL — HYDROCODON-APAP 7.5-325: 7.5-325 | 45 days supply | Qty: 45 | Fill #0

## 2019-09-08 MED FILL — HYDROXYCHLOROQUINE SULFATE: 200 | 30 days supply | Qty: 60 | Fill #0

## 2019-09-08 MED FILL — TADALAFIL 20 MG TABS: 20 | 20 days supply | Qty: 4 | Fill #1

## 2019-09-15 ENCOUNTER — Telehealth: Payer: Self-pay | Admitting: Student in an Organized Health Care Education/Training Program

## 2019-09-15 NOTE — Telephone Encounter (Signed)
Pt's wife requesting cpap supply, and cpap machine. Requesting the supply come from [facility], telephone [phone].

## 2019-09-15 NOTE — Telephone Encounter (Signed)
Pls contact patient regarding new mask for cpap [phone]

## 2019-09-16 NOTE — Telephone Encounter (Signed)
Pls contact pt [phone]

## 2019-09-17 NOTE — Telephone Encounter (Addendum)
Per pcp's note-OSA diagnosed in 2015 with two part sleep study in [place]. CMA was unable to locate sleep study and contacted [facility] for a copy of results.  Once received-will send order to pcp for cpap order.  Of note pcp also signed order for cpap supplies from [facility].Adam Aten Cassady5/27/20214:09 PM

## 2019-09-17 NOTE — Telephone Encounter (Addendum)
Attempted to contact patient and make him aware-no answer, message left on recorder.Despina Hidden Cassady5/27/20213:53 PM   Pt aware.Despina Hidden Cassady5/27/20214:38 PM

## 2019-09-23 ENCOUNTER — Other Ambulatory Visit: Payer: Self-pay | Admitting: Student in an Organized Health Care Education/Training Program

## 2019-09-23 MED FILL — SIMVASTATIN 20 MG TABLET: 20 | 90 days supply | Qty: 90 | Fill #0

## 2019-09-23 MED FILL — CYCLOBENZAPRINE HCL 10 MG T: 10 | 90 days supply | Qty: 90 | Fill #0

## 2019-09-23 NOTE — Telephone Encounter (Signed)
Pt wants a 90- day supply

## 2019-09-24 MED FILL — TADALAFIL 20 MG TABS: 20 | 30 days supply | Qty: 6 | Fill #0

## 2019-09-25 DIAGNOSIS — G4733 Obstructive sleep apnea (adult) (pediatric): Secondary | ICD-10-CM | POA: Diagnosis not present

## 2019-10-27 ENCOUNTER — Other Ambulatory Visit: Payer: Self-pay | Admitting: Student in an Organized Health Care Education/Training Program

## 2019-10-27 DIAGNOSIS — M47816 Spondylosis without myelopathy or radiculopathy, lumbar region: Secondary | ICD-10-CM

## 2019-10-27 MED FILL — TADALAFIL 20 MG TABS: 20 | 30 days supply | Qty: 6 | Fill #0

## 2019-10-28 MED FILL — HYDROCODON-APAP 7.5-325: 7.5-325 | 45 days supply | Qty: 45 | Fill #0

## 2019-11-03 ENCOUNTER — Other Ambulatory Visit: Payer: Self-pay | Admitting: Student in an Organized Health Care Education/Training Program

## 2019-11-03 MED ORDER — METFORMIN HCL 1000 MG PO TABS: 1000.0000 mg | ORAL_TABLET | Freq: Two times a day (BID) | ORAL | 3 refills | Status: DC

## 2019-11-03 MED FILL — METFORMIN HCL 1000 MG TABS: 1000 | 90 days supply | Qty: 180 | Fill #0

## 2019-11-03 NOTE — Telephone Encounter (Signed)
Need refill on metFORMIN (GLUCOPHAGE) 1000 MG tablet ;pt contact [place], [place]

## 2019-11-19 ENCOUNTER — Other Ambulatory Visit: Payer: Self-pay | Admitting: Rheumatology

## 2019-11-19 DIAGNOSIS — M0579 Rheumatoid arthritis with rheumatoid factor of multiple sites without organ or systems involvement: Secondary | ICD-10-CM

## 2019-11-19 MED FILL — DULOXETINE HCL 60 MG CPEP: 60 | 90 days supply | Qty: 90 | Fill #2

## 2019-11-19 MED FILL — LOSARTAN POTASSIUM 100 MG T: 100 | 90 days supply | Qty: 90 | Fill #1

## 2019-11-19 MED FILL — AMLODIPINE BESYLATE 5 MG TA: 5 | 90 days supply | Qty: 90 | Fill #2

## 2019-11-19 NOTE — Telephone Encounter (Signed)
Last Visit: 08/20/2019 Next Visit: 01/21/2020 Labs: 08/20/2019 RBC count and hct are borderline low. Rest of CBC WNL. Glucose is 113. Creatinine is elevated-1.41 and GFR is low-58. Potassium is borderline low-3.4. Eye exam: no baseline eye exam on file  Current Dose per office note 08/20/2019: Plaquenil 200 mg 1 tablet by mouth twice daily HQ:[id number] arthritis involving multiple sites with positive rheumatoid factor   Attempted to contact the patient and left message to advise patient we are needing his PLQ eye exam.   Okay to refill Plaquenil?

## 2019-11-23 ENCOUNTER — Other Ambulatory Visit: Payer: Self-pay | Admitting: Student in an Organized Health Care Education/Training Program

## 2019-11-23 DIAGNOSIS — E119 Type 2 diabetes mellitus without complications: Secondary | ICD-10-CM

## 2019-11-23 DIAGNOSIS — Z794 Long term (current) use of insulin: Secondary | ICD-10-CM

## 2019-11-23 MED FILL — PIOGLITAZONE HCL 45 MG TABS: 45 | 90 days supply | Qty: 90 | Fill #0

## 2019-11-23 MED FILL — HYDROCHLOROTHIAZIDE 25 MG T: 25 | 90 days supply | Qty: 90 | Fill #2

## 2019-12-04 ENCOUNTER — Other Ambulatory Visit: Payer: Self-pay | Admitting: Student

## 2019-12-04 ENCOUNTER — Encounter: Payer: Self-pay | Admitting: Student

## 2019-12-04 ENCOUNTER — Other Ambulatory Visit: Payer: Self-pay

## 2019-12-04 ENCOUNTER — Ambulatory Visit: Payer: 59 | Admitting: Student

## 2019-12-04 VITALS — BP 132/69 | HR 79 | Temp 98.3°F | Ht 72.0 in | Wt 279.4 lb

## 2019-12-04 DIAGNOSIS — M47816 Spondylosis without myelopathy or radiculopathy, lumbar region: Secondary | ICD-10-CM

## 2019-12-04 DIAGNOSIS — M659 Synovitis and tenosynovitis, unspecified: Secondary | ICD-10-CM

## 2019-12-04 DIAGNOSIS — N529 Male erectile dysfunction, unspecified: Secondary | ICD-10-CM

## 2019-12-04 MED ORDER — CYCLOBENZAPRINE HCL 10 MG PO TABS: 10.0000 mg | ORAL_TABLET | Freq: Every evening | ORAL | 1 refills | Status: DC | PRN

## 2019-12-04 MED ORDER — NAPROXEN 500 MG PO TABS: 500.0000 mg | ORAL_TABLET | Freq: Two times a day (BID) | ORAL | 1 refills | Status: DC | PRN

## 2019-12-04 MED ORDER — TADALAFIL 20 MG PO TABS: ORAL_TABLET | ORAL | 2 refills | Status: DC

## 2019-12-04 MED FILL — TADALAFIL 20 MG TABS: 20 | 30 days supply | Qty: 6 | Fill #0

## 2019-12-04 MED FILL — NAPROXEN 500 MG TABLET: 500 | 15 days supply | Qty: 30 | Fill #0

## 2019-12-04 NOTE — Patient Instructions (Addendum)
Thank you for allowing [place] to be a part of your care today, it was pleasure seeing you. We discussed your R hand pain from rheumatoid arthritis flare.  I am writing you Naproxen to be used twice a day as needed for arthritis flares. I am also referring you to the hand surgeon.  Also, consider switching from flexeril at night to benadryl. This will allow the flexeril to be useful for more future episode of acute pain.  I have made these changes to your medications: Started Naproxyn as needed  Please follow up for your scheduled appointment with your PCP on 9/13.   Thank you, and please call the Internal Medicine Clinic at [phone] if you have any questions.  Best, Dr. Bridgett Larsson

## 2019-12-04 NOTE — Assessment & Plan Note (Signed)
States that he uses Flexeril nightly for sleep and for pain. Reports mild improvement in pain with this. Counseled patient on tachyphylaxis with prolonged Flexeril use. He states that he will try to reduce his usage/switch to benadryl.  -instructed to try benadryl instead of Flexeril -Flexeril refilled

## 2019-12-04 NOTE — Progress Notes (Signed)
   CC: hand pain  HPI:  [name] is a 49 y.o. male with history as below presenting for R hand pain due to rheumatoid arthritis flare. Please refer to problem based charting for further details of assessment and plan of current problem and chronic medical conditions.   Past Medical History:  Diagnosis Date  . Anxiety   . Arthritis    lower back, hands  . Deviated septum    nasal turbinate hypertrophy  . Hypertension    states under control with meds., has been on medication since age 40  . Insulin dependent diabetes mellitus    type 2 DM  . Morbid obesity ([place])   . Sleep apnea    uses CPAP "most of the time", per pt.   Review of Systems:   Review of Systems  Constitutional: Negative for chills and fever.  Respiratory: Negative for cough and shortness of breath.   Cardiovascular: Negative for chest pain and palpitations.  Gastrointestinal: Negative for constipation, nausea and vomiting.  Musculoskeletal: Positive for back pain and joint pain. Negative for myalgias.     Physical Exam: Vitals:   12/04/19 1315  BP: 132/69  Pulse: 79  Temp: 98.3 F (36.8 C)  TempSrc: Oral  SpO2: 96%  Weight: 279 lb 6.4 oz (126.7 kg)  Height: 6' (1.829 m)   Constitutional: no acute distress Head: atraumatic ENT: external ears normal Cardiovascular: regular rate and rhythm, normal heart sounds Pulmonary: effort normal, normal breath sounds bilaterally Abdominal: flat, nontender, no rebound tenderness, bowel sounds normal Musculoskeletal: R 2nd and 3rd MCP joints with mild erythema, warmth, swelling compared to the L. Mildly tender. Pain when squeezing across R 4 MCP joints. No other joint swelling or tenderness detected.  Skin: warm and dry Neurological: alert, no focal deficit Psychiatric: normal mood and affect  Assessment & Plan:   See Encounters Tab for problem based charting.  Patient seen with Dr. Heber Antonito

## 2019-12-04 NOTE — Assessment & Plan Note (Signed)
Patient presents for acute on chronic pain to the R 2nd and 3rd MCP joints. He has history of rheumatoid arthritis treated with plaquenil, primarily has symptoms in these two joints and his R knee. States that symptoms have not significantly changed since starting plaquenil, has follow up on 9/30. Prior xray also demonstrated some degenerative changes. He reports flares roughly once a month, for which he uses tylenol. This flare started on Saturday and peaked over last weekend, it is improving at this time. On exam, his R 2nd and 3rd MCP joints are erythematous and slightly swollen compared to the L. He is requesting hand surgeon referral and/or steroid injections.  -referral placed to hand surgeon -Naproxen 500mg  BID prn prescribed for use in flares

## 2019-12-07 NOTE — Progress Notes (Signed)
Internal Medicine Clinic Attending  I saw and evaluated the patient.  I personally confirmed the key portions of the history and exam documented by Dr. Chen and I reviewed pertinent patient test results.  The assessment, diagnosis, and plan were formulated together and I agree with the documentation in the resident's note.  

## 2019-12-08 ENCOUNTER — Other Ambulatory Visit: Payer: Self-pay | Admitting: Student in an Organized Health Care Education/Training Program

## 2019-12-08 DIAGNOSIS — M47816 Spondylosis without myelopathy or radiculopathy, lumbar region: Secondary | ICD-10-CM

## 2019-12-08 MED FILL — CYCLOBENZAPRINE HCL 10 MG T: 10 | 90 days supply | Qty: 90 | Fill #0

## 2019-12-08 NOTE — Telephone Encounter (Signed)
HYDROcodone-acetaminophen (NORCO) 7.5-325 MG tablet, REFILL REQUEST @  [place], [place] - [address]. Phone:  [phone]  Fax:  [phone]

## 2019-12-09 MED FILL — HYDROCODON-APAP 7.5-325: 7.5-325 | 23 days supply | Qty: 45 | Fill #0

## 2019-12-18 DIAGNOSIS — M19041 Primary osteoarthritis, right hand: Secondary | ICD-10-CM | POA: Diagnosis not present

## 2019-12-18 DIAGNOSIS — M069 Rheumatoid arthritis, unspecified: Secondary | ICD-10-CM | POA: Diagnosis not present

## 2019-12-30 MED FILL — SIMVASTATIN 20 MG TABLET: 20 | 90 days supply | Qty: 90 | Fill #1

## 2020-01-04 ENCOUNTER — Encounter: Payer: 59 | Admitting: Student in an Organized Health Care Education/Training Program

## 2020-01-11 NOTE — Progress Notes (Deleted)
Office Visit Note  Patient: Adam [name]             Date of Birth: 08/02/1970           MRN: [id number]             PCP: Axel Filler, MD Referring: Axel Filler Visit Date: 01/21/2020 Occupation: @GUAROCC @  Subjective:  No chief complaint on file.   History of Present Illness: Adam [name] is a 49 y.o. male ***   Activities of Daily Living:  Patient reports morning stiffness for *** {minute/hour:19697}.   Patient {ACTIONS;DENIES/REPORTS:21021675::"Denies"} nocturnal pain.  Difficulty dressing/grooming: {ACTIONS;DENIES/REPORTS:21021675::"Denies"} Difficulty climbing stairs: {ACTIONS;DENIES/REPORTS:21021675::"Denies"} Difficulty getting out of chair: {ACTIONS;DENIES/REPORTS:21021675::"Denies"} Difficulty using hands for taps, buttons, cutlery, and/or writing: {ACTIONS;DENIES/REPORTS:21021675::"Denies"}  No Rheumatology ROS completed.   PMFS History:  Patient Active Problem List   Diagnosis Date Noted  . Acute bacterial sinusitis 05/08/2019  . Synovitis of hand 11/10/2018  . Hearing loss 11/12/2016  . Obesity, Class III, BMI 40-49.9 (morbid obesity) ([place]) 11/12/2016  . Osteoarthritis of lumbar spine 04/09/2016  . Tendinopathy of left rotator cuff 12/30/2015  . Obstructive sleep apnea 02/28/2015  . Type 2 diabetes mellitus with other specified complication ([place]) 49/44/9675  . Erectile dysfunction 01/06/2015  . Healthcare maintenance 01/06/2015  . Essential hypertension 01/06/2015    Past Medical History:  Diagnosis Date  . Anxiety   . Arthritis    lower back, hands  . Deviated septum    nasal turbinate hypertrophy  . Hypertension    states under control with meds., has been on medication since age 68  . Insulin dependent diabetes mellitus    type 2 DM  . Morbid obesity ([place])   . Sleep apnea    uses CPAP "most of the time", per pt.    Family History  Problem Relation Age of Onset  . Hyperlipidemia Mother   . Aneurysm Mother   .  Hypertension Mother   . Diabetes Father   . Hyperlipidemia Father   . Hypertension Father   . Healthy Daughter   . Healthy Daughter    Past Surgical History:  Procedure Laterality Date  . CARPAL TUNNEL RELEASE Right 07/2014  . LAPAROSCOPIC GASTRIC SLEEVE RESECTION     Dr. Excell Seltzer 09-09-17  . LAPAROSCOPIC GASTRIC SLEEVE RESECTION N/A 09/09/2017   Procedure: LAPAROSCOPIC GASTRIC SLEEVE RESECTION  AND ERAS PATHWAY;  Surgeon: Excell Seltzer, MD;  Location: [facility];  Service: General;  Laterality: N/A;  . LUMBAR FUSION    . NASAL SEPTOPLASTY W/ TURBINOPLASTY Bilateral 03/22/2017   Procedure: NASAL SEPTOPLASTY WITH  BILATERAL INFERIOR   TURBINATE REDUCTION;  Surgeon: Jerrell Belfast, MD;  Location: [facility];  Service: ENT;  Laterality: Bilateral;  . SHOULDER ARTHROSCOPY Left    x 2  . TONSILLECTOMY AND ADENOIDECTOMY    . TYMPANOSTOMY TUBE PLACEMENT Bilateral   . WISDOM TOOTH EXTRACTION     Social History   Social History Narrative  . Not on file   Immunization History  Administered Date(s) Administered  . Influenza Inj Mdck Quad Pf 01/10/2018  . Influenza,inj,Quad PF,6+ Mos 01/06/2015, 01/07/2017, 01/22/2019  . Influenza-Unspecified 03/10/2016  . PFIZER SARS-COV-2 Vaccination 07/04/2019, 07/28/2019  . Pneumococcal Polysaccharide-23 01/06/2015  . Tdap 12/09/2010     Objective: Vital Signs: There were no vitals taken for this visit.   Physical Exam   Musculoskeletal Exam: ***  CDAI Exam: CDAI Score: -- Patient Global: --; Provider Global: -- Swollen: --; Tender: -- Joint Exam 01/21/2020   No joint exam  has been documented for this visit   There is currently no information documented on the homunculus. Go to the Rheumatology activity and complete the homunculus joint exam.  Investigation: No additional findings.  Imaging: No results found.  Recent Labs: Lab Results  Component Value Date   WBC 8.6 08/20/2019   HGB 13.2 08/20/2019   PLT 179 08/20/2019   NA 139  08/20/2019   K 3.4 (L) 08/20/2019   CL 99 08/20/2019   CO2 28 08/20/2019   GLUCOSE 113 (H) 08/20/2019   BUN 23 08/20/2019   CREATININE 1.41 (H) 08/20/2019   BILITOT 0.5 08/20/2019   ALKPHOS 87 11/10/2018   AST 22 08/20/2019   ALT 23 08/20/2019   PROT 6.2 08/20/2019   ALBUMIN 4.7 11/10/2018   CALCIUM 8.9 08/20/2019   GFRAA 67 08/20/2019   QFTBGOLDPLUS NEGATIVE 12/25/2018    Speciality Comments: No specialty comments available.  Procedures:  No procedures performed Allergies: Patient has no known allergies.   Assessment / Plan:     Visit Diagnoses: No diagnosis found.  Orders: No orders of the defined types were placed in this encounter.  No orders of the defined types were placed in this encounter.   Face-to-face time spent with patient was *** minutes. Greater than 50% of time was spent in counseling and coordination of care.  Follow-Up Instructions: No follow-ups on file.   Earnestine Mealing, CMA  Note - This record has been created using Editor, commissioning.  Chart creation errors have been sought, but may not always  have been located. Such creation errors do not reflect on  the standard of medical care.

## 2020-01-18 ENCOUNTER — Telehealth: Payer: Self-pay | Admitting: *Deleted

## 2020-01-18 ENCOUNTER — Other Ambulatory Visit: Payer: Self-pay

## 2020-01-18 ENCOUNTER — Ambulatory Visit (INDEPENDENT_AMBULATORY_CARE_PROVIDER_SITE_OTHER): Payer: 59 | Admitting: Student

## 2020-01-18 ENCOUNTER — Encounter: Payer: Self-pay | Admitting: Student

## 2020-01-18 VITALS — BP 142/69 | HR 93 | Temp 98.2°F | Ht 72.0 in | Wt 268.9 lb

## 2020-01-18 DIAGNOSIS — N289 Disorder of kidney and ureter, unspecified: Secondary | ICD-10-CM | POA: Insufficient documentation

## 2020-01-18 DIAGNOSIS — F1021 Alcohol dependence, in remission: Secondary | ICD-10-CM

## 2020-01-18 DIAGNOSIS — F419 Anxiety disorder, unspecified: Secondary | ICD-10-CM

## 2020-01-18 MED ORDER — BUSPIRONE HCL 5 MG PO TABS: 5.0000 mg | ORAL_TABLET | Freq: Two times a day (BID) | ORAL | 0 refills | Status: DC

## 2020-01-18 MED FILL — busPIRone HCL 5 MG TABS: 5 | 15 days supply | Qty: 30 | Fill #0

## 2020-01-18 NOTE — Progress Notes (Signed)
Internal Medicine Clinic Attending  Case discussed with Dr. Agyei  At the time of the visit.  We reviewed the resident's history and exam and pertinent patient test results.  I agree with the assessment, diagnosis, and plan of care documented in the resident's note.  

## 2020-01-18 NOTE — Progress Notes (Addendum)
   CC: Anxiety  HPI:  [name] is a 49 y.o. with medical history significant for anxiety presented to the clinic as a walk-in for further management.  Please see problem based charting for further details.  Past Medical History:  Diagnosis Date  . Anxiety   . Arthritis    lower back, hands  . Deviated septum    nasal turbinate hypertrophy  . Hypertension    states under control with meds., has been on medication since age 54  . Insulin dependent diabetes mellitus    type 2 DM  . Morbid obesity ([place])   . Sleep apnea    uses CPAP "most of the time", per pt.   Review of Systems:  AS per HPI  Physical Exam:  Vitals:   01/18/20 1423  BP: (!) 142/69  Pulse: 93  Temp: 98.2 F (36.8 C)  TempSrc: Oral  SpO2: 96%  Weight: 268 lb 14.4 oz (122 kg)  Height: 6' (1.829 m)   Physical Exam Constitutional:      Appearance: Normal appearance.  Cardiovascular:     Rate and Rhythm: Normal rate.     Heart sounds: No murmur heard.   Pulmonary:     Breath sounds: No rales.  Neurological:     Mental Status: He is alert.  Psychiatric:        Attention and Perception: Attention and perception normal. He is attentive.        Mood and Affect: Mood is not anxious, depressed or elated. Affect is not labile, angry, tearful or inappropriate.        Speech: Speech normal.        Behavior: Behavior normal.        Thought Content: Thought content normal.     Assessment & Plan:   See Encounters Tab for problem based charting.  Patient discussed with Dr. Jimmye Norman   Internal Medicine Attending: I reviewed case and documentation and agree.

## 2020-01-18 NOTE — Patient Instructions (Signed)
Adam [name],  Thanks for seeing me in the clinic today.  As we discussed, I have filled out the FMLA form and I will get blood work before starting you on the medication to reduce your craving for alcohol.  Take care! Dr. Eileen Stanford  Please call the internal medicine center clinic if you have any questions or concerns, we may be able to help and keep you from a long and expensive emergency room wait. Our clinic and after hours phone number is [phone], the best time to call is Monday through Friday 9 am to 4 pm but there is always someone available 24/7 if you have an emergency. If you need medication refills please notify your pharmacy one week in advance and they will send [place] a request.

## 2020-01-18 NOTE — Assessment & Plan Note (Addendum)
On August 20, 2019, he had laboratory work and BMP showed reduced renal function with serum creatinine of 1.4 from a baseline of 0.9, GFR 59 from 92 however I do not see any repeat labs performed afterwards.  Plan: -Repeat CMP

## 2020-01-18 NOTE — Telephone Encounter (Signed)
Thank you for letting us know. However, Dr. Collene Gobble is not here this afternoon, we may have to switch that patient to another provider.

## 2020-01-18 NOTE — Assessment & Plan Note (Signed)
Alcohol use disorder, early remission: Mr. Tomey states that he has decided to stop drinking alcohol and has been sober for 2 days since 01/17/2020.  He recently started going to [employer] and has been doing very well.  Since stopping alcohol use, he is not experienced seizure-like activities and denies any prior history of alcohol withdrawal seizures but does report that he is quite anxious.  During his alcoholic Anonymous meetings, he has received buspirone which has improved his anxiety.  He states that he is interested in starting medication to decrease his craving for alcohol use.  GAD-7 score 16  Plan: -Encouraged to continue following up with Alcoholics Anonymous -Given short course of buspirone for 15 days to help with anxiety -Follow-up CMP, if unremarkable will start naltrexone -FMLA form filled from 01/18/2020-01/22/2020

## 2020-01-18 NOTE — Telephone Encounter (Signed)
Patient walked in stating he wanted to let PCP know that he has joined AA. Requesting something for anxiety. States he is still taking duloxetine but it is not helping with the anxiety. Patient already has first available appt with PCP, however, that is not till 02/01/2020. Appt made for today with Resident to discuss anxiety. Hubbard Hartshorn, BSN, RN-BC

## 2020-01-19 ENCOUNTER — Telehealth: Payer: Self-pay | Admitting: Student

## 2020-01-19 LAB — CMP14 + ANION GAP
ALT: 37 IU/L (ref 0–44)
AST: 41 IU/L — ABNORMAL HIGH (ref 0–40)
Albumin/Globulin Ratio: 2.4 — ABNORMAL HIGH (ref 1.2–2.2)
Albumin: 4.7 g/dL (ref 3.8–4.9)
Alkaline Phosphatase: 95 IU/L (ref 44–121)
Anion Gap: 21 mmol/L — ABNORMAL HIGH (ref 10.0–18.0)
BUN/Creatinine Ratio: 14 (ref 9–20)
BUN: 17 mg/dL (ref 6–24)
Bilirubin Total: 0.3 mg/dL (ref 0.0–1.2)
CO2: 23 mmol/L (ref 20–29)
Calcium: 9.5 mg/dL (ref 8.7–10.2)
Chloride: 93 mmol/L — ABNORMAL LOW (ref 96–106)
Creatinine, Ser: 1.21 mg/dL (ref 0.76–1.27)
GFR calc Af Amer: 80 mL/min/{1.73_m2} (ref >59)
GFR calc non Af Amer: 69 mL/min/{1.73_m2} (ref >59)
Globulin, Total: 2 g/dL (ref 1.5–4.5)
Glucose: 177 mg/dL — ABNORMAL HIGH (ref 65–99)
Potassium: 3.4 mmol/L — ABNORMAL LOW (ref 3.5–5.2)
Sodium: 137 mmol/L (ref 134–144)
Total Protein: 6.7 g/dL (ref 6.0–8.5)

## 2020-01-19 MED ORDER — NALTREXONE HCL 50 MG PO TABS: 50.0000 mg | ORAL_TABLET | Freq: Every day | ORAL | 0 refills | Status: DC

## 2020-01-19 NOTE — Telephone Encounter (Addendum)
Called patient 01/19/20 with results of yesterday's CMP. Discussed renal function slightly improved since 08/20/2019 with liver enzymes in the upper limit of normal. Patient requesting naltrexone, as discussed with Dr. Eileen Stanford yesterday. Will send in 2 week supply and will follow-up with Dr. Evette Doffing on 02/01/2020.

## 2020-01-21 ENCOUNTER — Ambulatory Visit: Payer: 59 | Admitting: Rheumatology

## 2020-01-27 DIAGNOSIS — Z638 Other specified problems related to primary support group: Secondary | ICD-10-CM | POA: Diagnosis not present

## 2020-01-27 DIAGNOSIS — F4322 Adjustment disorder with anxiety: Secondary | ICD-10-CM | POA: Diagnosis not present

## 2020-01-27 DIAGNOSIS — Z569 Unspecified problems related to employment: Secondary | ICD-10-CM | POA: Diagnosis not present

## 2020-01-27 DIAGNOSIS — Z630 Problems in relationship with spouse or partner: Secondary | ICD-10-CM | POA: Diagnosis not present

## 2020-01-27 DIAGNOSIS — Z729 Problem related to lifestyle, unspecified: Secondary | ICD-10-CM | POA: Diagnosis not present

## 2020-01-27 DIAGNOSIS — Z62811 Personal history of psychological abuse in childhood: Secondary | ICD-10-CM | POA: Diagnosis not present

## 2020-02-01 ENCOUNTER — Encounter: Payer: Self-pay | Admitting: Student in an Organized Health Care Education/Training Program

## 2020-02-01 ENCOUNTER — Ambulatory Visit (INDEPENDENT_AMBULATORY_CARE_PROVIDER_SITE_OTHER): Payer: 59 | Admitting: Student in an Organized Health Care Education/Training Program

## 2020-02-01 ENCOUNTER — Other Ambulatory Visit: Payer: Self-pay | Admitting: Student in an Organized Health Care Education/Training Program

## 2020-02-01 VITALS — BP 149/72 | HR 70 | Temp 97.8°F | Ht 72.0 in | Wt 266.0 lb

## 2020-02-01 DIAGNOSIS — Z0000 Encounter for general adult medical examination without abnormal findings: Secondary | ICD-10-CM | POA: Diagnosis not present

## 2020-02-01 DIAGNOSIS — F1021 Alcohol dependence, in remission: Secondary | ICD-10-CM | POA: Diagnosis not present

## 2020-02-01 DIAGNOSIS — E1169 Type 2 diabetes mellitus with other specified complication: Secondary | ICD-10-CM | POA: Diagnosis not present

## 2020-02-01 DIAGNOSIS — Z794 Long term (current) use of insulin: Secondary | ICD-10-CM

## 2020-02-01 DIAGNOSIS — M47816 Spondylosis without myelopathy or radiculopathy, lumbar region: Secondary | ICD-10-CM | POA: Diagnosis not present

## 2020-02-01 DIAGNOSIS — M05741 Rheumatoid arthritis with rheumatoid factor of right hand without organ or systems involvement: Secondary | ICD-10-CM

## 2020-02-01 DIAGNOSIS — I10 Essential (primary) hypertension: Secondary | ICD-10-CM

## 2020-02-01 DIAGNOSIS — Z125 Encounter for screening for malignant neoplasm of prostate: Secondary | ICD-10-CM | POA: Diagnosis not present

## 2020-02-01 DIAGNOSIS — Z1211 Encounter for screening for malignant neoplasm of colon: Secondary | ICD-10-CM | POA: Diagnosis not present

## 2020-02-01 DIAGNOSIS — Z23 Encounter for immunization: Secondary | ICD-10-CM

## 2020-02-01 LAB — GLUCOSE, CAPILLARY: Glucose-Capillary: 190 mg/dL — ABNORMAL HIGH (ref 70–99)

## 2020-02-01 LAB — POCT GLYCOSYLATED HEMOGLOBIN (HGB A1C): Hemoglobin A1C: 6.6 % — AB (ref 4.0–5.6)

## 2020-02-01 MED ORDER — ZOSTER VAC RECOMB ADJUVANTED 50 MCG/0.5ML IM SUSR
0.5000 mL | Freq: Once | INTRAMUSCULAR | 1 refills | Status: AC
Start: 2020-02-01 — End: 2020-02-01

## 2020-02-01 MED ORDER — HYDROCODONE-ACETAMINOPHEN 7.5-325 MG PO TABS: 1.0000 | ORAL_TABLET | Freq: Every day | ORAL | 0 refills | Status: DC | PRN

## 2020-02-01 MED FILL — HYDROCODON-APAP 7.5-325: 7.5-325 | 45 days supply | Qty: 45 | Fill #0

## 2020-02-01 NOTE — Progress Notes (Signed)
   Assessment and Plan:  See Encounters tab for problem-based medical decision making.   __________________________________________________________  HPI:   49 year old person living with diabetes and hypertension here for annual wellness visit.  Doing really well, has made a lot of positive changes in his life recently.  Had a gastric sleeve surgery a few years ago and is seeing tremendous weight loss since that time.  About a month ago he decided to quit drinking alcohol, along with his wife.  Doing well with this, has not needed naltrexone.  Great family support and doing well with counseling services through [facility].  Good adherence with most of his medications, some questions about his immunosuppressive medicine for the recent diagnosis of rheumatoid arthritis.  He is being managed by Dr. Patrecia Pour for this.   __________________________________________________________  Problem List: Patient Active Problem List   Diagnosis Date Noted  . Rheumatoid arthritis ([place]) 11/10/2018    Priority: High  . Obesity, Class III, BMI 40-49.9 (morbid obesity) ([place]) 11/12/2016    Priority: High  . Type 2 diabetes mellitus with other specified complication ([place]) 74/25/9563    Priority: High  . Alcohol use disorder, in early remission ([place]) 01/18/2020    Priority: Medium  . Hearing loss 11/12/2016    Priority: Medium  . Osteoarthritis of lumbar spine 04/09/2016    Priority: Medium  . Tendinopathy of left rotator cuff 12/30/2015    Priority: Medium  . Obstructive sleep apnea 02/28/2015    Priority: Medium  . Essential hypertension 01/06/2015    Priority: Medium  . Erectile dysfunction 01/06/2015    Priority: Low  . Healthcare maintenance 01/06/2015    Priority: Low    Medications: Reconciled today in Epic __________________________________________________________  Physical Exam:  Vital Signs: Vitals:   02/01/20 0937 02/01/20 1013  BP: (!) 151/80 (!) 149/72  Pulse: 66 70  Temp: 97.8 F  (36.6 C)   TempSrc: Oral   SpO2: 98%   Weight: 266 lb (120.7 kg)   Height: 6' (1.829 m)     Gen: Well appearing, NAD Neck: No cervical LAD, No thyromegaly or nodules CV: RRR, no murmurs Pulm: Normal effort, CTA throughout, no wheezing Abd: Soft, NT, ND Ext: Warm, no edema, normal joints Skin: No atypical appearing moles. No rashes.  1 small actinic keratosis on the left side of his face.

## 2020-02-01 NOTE — Assessment & Plan Note (Signed)
Managed by Dr. Estanislado Pandy.  Currently off Plaquenil, encouraged him to follow-up with his rheumatologist to discuss long-term disease modifying medications.

## 2020-02-01 NOTE — Patient Instructions (Signed)
It was great seeing you today in clinic.  We talked about your healthcare maintenance and general screening.  You received a flu shot today.  I sent a prescription for the shingles vaccine to your pharmacy.  You will gets 1 vaccine, and then the second dose you can take in 4-8 weeks later.  They received the stool test for your colon cancer screening.  We also drew a lab for prostate cancer screening.  Your diabetes is under great control.  Your weight loss has been impressive.  Keep taking all your medications as prescribed.  You are doing a great job.

## 2020-02-01 NOTE — Assessment & Plan Note (Signed)
Stable, doing very well.  Has not needed naltrexone.  He is going to continue with his lifestyle modifications, good family support.

## 2020-02-01 NOTE — Assessment & Plan Note (Signed)
Flu shot given today.  Sent a prescription for Shingrix to his pharmacy.  Gave him the fecal immunochemical test for colon cancer screening today.  We talked about prostate cancer screening, decided to go ahead with a PSA today.

## 2020-02-01 NOTE — Assessment & Plan Note (Signed)
Symptomatically stable.  Good functional status.  Uses occasional hydrocodone, at most once daily.  Refilled this today, prescription of #45 generally lasts him about 6-8 weeks.

## 2020-02-01 NOTE — Assessment & Plan Note (Signed)
Hemoglobin A1c under excellent control at 6.6%.  Now off insulin products.  Seeing rewards from 60 pound weight loss related to gastric sleeve 2 years ago.  If his A1c remains under 7% consistently we can discontinue the pioglitazone and continue with Metformin alone.  Foot exam was normal today.  Aspirin and statin for primary prevention of ischemic vascular disease.  Losartan for renal protection.

## 2020-02-01 NOTE — Assessment & Plan Note (Signed)
Resistant blood pressure elevation, not much improvement even with his weight loss.  Large genetic component.  Plan to continue with amlodipine 5 mg, hydrochlorothiazide 25 mg, losartan 100 mg.  Hopefully we will see benefits from discontinuing alcohol.  Sleep apnea is well treated.  BMP last month was normal.

## 2020-02-02 LAB — PSA: Prostate Specific Ag, Serum: 0.3 ng/mL (ref 0.0–4.0)

## 2020-02-03 ENCOUNTER — Telehealth: Payer: Self-pay

## 2020-02-03 NOTE — Telephone Encounter (Signed)
Thanks. I spoke with him, gave him normal result. He said he couldn't access his Mychart, which was unusual. Hope it works for him in the future.

## 2020-02-03 NOTE — Telephone Encounter (Signed)
Requesting lab results, please call pt back.  

## 2020-02-05 ENCOUNTER — Other Ambulatory Visit: Payer: Self-pay | Admitting: Student in an Organized Health Care Education/Training Program

## 2020-02-05 DIAGNOSIS — I10 Essential (primary) hypertension: Secondary | ICD-10-CM

## 2020-02-05 MED FILL — LOSARTAN POTASSIUM 100 MG T: 100 | 90 days supply | Qty: 90 | Fill #2

## 2020-02-05 MED FILL — TADALAFIL 20 MG TABS: 20 | 30 days supply | Qty: 6 | Fill #0

## 2020-02-05 MED FILL — METFORMIN HCL 1000 MG TABS: 1000 | 90 days supply | Qty: 180 | Fill #1

## 2020-02-05 MED FILL — DULOXETINE HCL 60 MG CPEP: 60 | 90 days supply | Qty: 90 | Fill #3

## 2020-02-08 ENCOUNTER — Other Ambulatory Visit: Payer: Self-pay | Admitting: Student in an Organized Health Care Education/Training Program

## 2020-02-08 MED FILL — HYDROCHLOROTHIAZIDE 25 MG T: 25 | 90 days supply | Qty: 90 | Fill #0

## 2020-02-08 MED FILL — AMLODIPINE BESYLATE 5 MG TA: 5 | 90 days supply | Qty: 90 | Fill #0

## 2020-02-09 ENCOUNTER — Encounter: Payer: Self-pay | Admitting: *Deleted

## 2020-02-09 ENCOUNTER — Telehealth: Payer: Self-pay | Admitting: Student in an Organized Health Care Education/Training Program

## 2020-02-09 DIAGNOSIS — F1021 Alcohol dependence, in remission: Secondary | ICD-10-CM

## 2020-02-09 NOTE — Telephone Encounter (Signed)
Pt calling to Requesting  a Referral for Alcohol Counseling and [employer].  Please advise if a referral can be placed.

## 2020-02-10 NOTE — Telephone Encounter (Signed)
Thank you.  Referral has been Authorized and forwarded to [employer] [facility].  Please see notes below.  "Letter and resources mailed to pt today."  Yvonna Alanis, RN, 02/09/20, 4:37P

## 2020-02-19 DIAGNOSIS — E119 Type 2 diabetes mellitus without complications: Secondary | ICD-10-CM | POA: Diagnosis not present

## 2020-02-19 DIAGNOSIS — H5213 Myopia, bilateral: Secondary | ICD-10-CM | POA: Diagnosis not present

## 2020-02-19 LAB — HM DIABETES EYE EXAM

## 2020-03-02 ENCOUNTER — Encounter: Payer: Self-pay | Admitting: *Deleted

## 2020-03-03 ENCOUNTER — Ambulatory Visit (INDEPENDENT_AMBULATORY_CARE_PROVIDER_SITE_OTHER): Payer: 59

## 2020-03-03 ENCOUNTER — Other Ambulatory Visit: Payer: Self-pay | Admitting: Family Medicine

## 2020-03-03 ENCOUNTER — Encounter: Payer: Self-pay | Admitting: Family Medicine

## 2020-03-03 ENCOUNTER — Ambulatory Visit (INDEPENDENT_AMBULATORY_CARE_PROVIDER_SITE_OTHER): Payer: 59 | Admitting: Family Medicine

## 2020-03-03 ENCOUNTER — Other Ambulatory Visit: Payer: Self-pay

## 2020-03-03 VITALS — BP 116/78 | HR 65 | Ht 72.0 in | Wt 256.0 lb

## 2020-03-03 DIAGNOSIS — M545 Low back pain, unspecified: Secondary | ICD-10-CM | POA: Diagnosis not present

## 2020-03-03 DIAGNOSIS — M25552 Pain in left hip: Secondary | ICD-10-CM | POA: Diagnosis not present

## 2020-03-03 DIAGNOSIS — M05741 Rheumatoid arthritis with rheumatoid factor of right hand without organ or systems involvement: Secondary | ICD-10-CM

## 2020-03-03 DIAGNOSIS — M542 Cervicalgia: Secondary | ICD-10-CM | POA: Insufficient documentation

## 2020-03-03 DIAGNOSIS — G8929 Other chronic pain: Secondary | ICD-10-CM

## 2020-03-03 DIAGNOSIS — M999 Biomechanical lesion, unspecified: Secondary | ICD-10-CM | POA: Diagnosis not present

## 2020-03-03 MED ORDER — GABAPENTIN 100 MG PO CAPS: 200.0000 mg | ORAL_CAPSULE | Freq: Every day | ORAL | 3 refills | Status: DC

## 2020-03-03 MED ORDER — TIZANIDINE HCL 4 MG PO TABS: 4.0000 mg | ORAL_TABLET | Freq: Every day | ORAL | 1 refills | Status: DC

## 2020-03-03 NOTE — Assessment & Plan Note (Signed)
   Decision today to treat with OMT was based on Physical Exam  After verbal consent patient was treated with HVLA, ME, FPR techniques in cervical, thoracic,  lumbar and sacral areas, all areas are chronic   Patient tolerated the procedure well with improvement in symptoms  Patient given exercises, stretches and lifestyle modifications  See medications in patient instructions if given  Patient will follow up in 4 weeks
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
## 2020-03-03 NOTE — Assessment & Plan Note (Signed)
ChangesMultifactorial.  Patient has changed to more than sedentary work when patient has been significantly more active.  X-rays ordered today.  He may need to consider an adjustable standing desk.  Responded fairly well to osteopathic manipulation but will take some time.  Patient including the gabapentin given as well as the Zanaflex.  Warned of potential side effects and patient will discontinue the other muscle relaxer he was on.  Follow-up with me again 4 weeks

## 2020-03-03 NOTE — Patient Instructions (Addendum)
Good to see you Xray of lumbar cervical and left hip Stop flexeril and take zanaflex Gabapentin 100 mg at night Exercises for the back and neck Ice 2 times a day Turmeric 500mg  daily  See me again in 4 weeks

## 2020-03-03 NOTE — Assessment & Plan Note (Signed)
Known history of osteoarthritic changes and did have an L5-S1 or L4-L5 fusion we will get new x-rays to further evaluate.  Discontinued the Flexeril secondary to side effects and given Zanaflex.  Also given gabapentin.  Patient is to work on core strengthening.  Work with Product/process development scientist today.  Attempted osteopathic manipulation.  Follow-up again in 4 to 5 weeks

## 2020-03-07 DIAGNOSIS — R0781 Pleurodynia: Secondary | ICD-10-CM | POA: Diagnosis not present

## 2020-03-07 DIAGNOSIS — M25511 Pain in right shoulder: Secondary | ICD-10-CM | POA: Diagnosis not present

## 2020-03-07 DIAGNOSIS — M79642 Pain in left hand: Secondary | ICD-10-CM | POA: Diagnosis not present

## 2020-03-07 DIAGNOSIS — M79621 Pain in right upper arm: Secondary | ICD-10-CM | POA: Diagnosis not present

## 2020-03-09 ENCOUNTER — Other Ambulatory Visit: Payer: Self-pay | Admitting: Student in an Organized Health Care Education/Training Program

## 2020-03-09 DIAGNOSIS — M47816 Spondylosis without myelopathy or radiculopathy, lumbar region: Secondary | ICD-10-CM

## 2020-03-14 MED FILL — HYDROCODON-APAP 7.5-325: 7.5-325 | 45 days supply | Qty: 45 | Fill #0

## 2020-03-23 ENCOUNTER — Encounter: Payer: Self-pay | Admitting: Family Medicine

## 2020-03-23 ENCOUNTER — Other Ambulatory Visit: Payer: Self-pay

## 2020-03-23 ENCOUNTER — Ambulatory Visit (INDEPENDENT_AMBULATORY_CARE_PROVIDER_SITE_OTHER): Payer: 59

## 2020-03-23 ENCOUNTER — Ambulatory Visit (INDEPENDENT_AMBULATORY_CARE_PROVIDER_SITE_OTHER): Payer: 59 | Admitting: Family Medicine

## 2020-03-23 ENCOUNTER — Ambulatory Visit: Payer: Self-pay

## 2020-03-23 VITALS — BP 124/82 | HR 61 | Ht 72.0 in | Wt 256.0 lb

## 2020-03-23 DIAGNOSIS — S4351XA Sprain of right acromioclavicular joint, initial encounter: Secondary | ICD-10-CM

## 2020-03-23 DIAGNOSIS — M25511 Pain in right shoulder: Secondary | ICD-10-CM

## 2020-03-23 DIAGNOSIS — M7591 Shoulder lesion, unspecified, right shoulder: Secondary | ICD-10-CM | POA: Diagnosis not present

## 2020-03-23 NOTE — Progress Notes (Signed)
[place] [employer] [place] Phone: [phone] Subjective:   Adam [name], am serving as a scribe for Dr. Hulan Saas.   I'm seeing this patient by the request  of:  Axel Filler, MD  CC: Back and neck pain follow-up, shoulder pain  [id number]   03/03/2020 ChangesMultifactorial.  Patient has changed to more than sedentary work when patient has been significantly more active.  X-rays ordered today.  He may need to consider an adjustable standing desk.  Responded fairly well to osteopathic manipulation but will take some time.  Patient including the gabapentin given as well as the Zanaflex.  Warned of potential side effects and patient will discontinue the other muscle relaxer he was on.  Follow-up with me again 4 weeks  Known history of osteoarthritic changes and did have an L5-S1 or L4-L5 fusion we will get new x-rays to further evaluate.  Discontinued the Flexeril secondary to side effects and given Zanaflex.  Also given gabapentin.  Patient is to work on core strengthening.  Work with Product/process development scientist today.  Attempted osteopathic manipulation.  Follow-up again in 4 to 5 weeks  Update 03/23/2020 Adam [name] is a 49 y.o. male coming in with complaint of low back pain. Patient states that he fell on his shoulder a few days after his last visit. Pain was over clavicle and head of humerus. Pain radiated into neck. Pain with getting out of chair. Did have clavicle xray. Denies any distal radiating symptoms. Still feels tightness in right scapula. Did not like gabapentin as it messed with his stomach and did not allow him to sleep. Is using zanaflex for sleep.    Was seen 3 weeks ago given gabapentin we also attempted osteopathic manipulation  Patient had x-rays done of the lumbar spine showing mild multilevel degenerative disc disease mostly from L1 L3 surgical fusion noted at L5-S1 Cervical neck x-rays were  unremarkable as well as patient's left hip x-rays Past Medical History:  Diagnosis Date  . Anxiety   . Arthritis    lower back, hands  . Deviated septum    nasal turbinate hypertrophy  . Hypertension    states under control with meds., has been on medication since age 2  . Insulin dependent diabetes mellitus    type 2 DM  . Morbid obesity ([place])   . Sleep apnea    uses CPAP "most of the time", per pt.   Past Surgical History:  Procedure Laterality Date  . CARPAL TUNNEL RELEASE Right 07/2014  . LAPAROSCOPIC GASTRIC SLEEVE RESECTION     Dr. Excell Seltzer 09-09-17  . LAPAROSCOPIC GASTRIC SLEEVE RESECTION N/A 09/09/2017   Procedure: LAPAROSCOPIC GASTRIC SLEEVE RESECTION  AND ERAS PATHWAY;  Surgeon: Excell Seltzer, MD;  Location: [facility];  Service: General;  Laterality: N/A;  . LUMBAR FUSION    . NASAL SEPTOPLASTY W/ TURBINOPLASTY Bilateral 03/22/2017   Procedure: NASAL SEPTOPLASTY WITH  BILATERAL INFERIOR   TURBINATE REDUCTION;  Surgeon: Jerrell Belfast, MD;  Location: [facility];  Service: ENT;  Laterality: Bilateral;  . SHOULDER ARTHROSCOPY Left    x 2  . TONSILLECTOMY AND ADENOIDECTOMY    . TYMPANOSTOMY TUBE PLACEMENT Bilateral   . WISDOM TOOTH EXTRACTION     Social History   Socioeconomic History  . Marital status: Married    Spouse name: Not on file  . Number of children: Not on file  . Years of education: Not on file  . Highest education level: Not  on file  Occupational History  . Not on file  Tobacco Use  . Smoking status: Never Smoker  . Smokeless tobacco: Former Systems developer    Types: Secondary school teacher  . Vaping Use: Never used  Substance and Sexual Activity  . Alcohol use: Yes    Alcohol/week: 14.0 standard drinks    Types: 14 Standard drinks or equivalent per week    Comment: 2 drinks/day  . Drug use: [name]  . Sexual activity: Yes  Other Topics Concern  . Not on file  Social History Narrative  . Not on file   Social Determinants of Health   Financial Resource Strain:    . Difficulty of Paying Living Expenses: Not on file  Food Insecurity:   . Worried About Charity fundraiser in the Last Year: Not on file  . Ran Out of Food in the Last Year: Not on file  Transportation Needs:   . Lack of Transportation (Medical): Not on file  . Lack of Transportation (Non-Medical): Not on file  Physical Activity:   . Days of Exercise per Week: Not on file  . Minutes of Exercise per Session: Not on file  Stress:   . Feeling of Stress : Not on file  Social Connections:   . Frequency of Communication with Friends and Family: Not on file  . Frequency of Social Gatherings with Friends and Family: Not on file  . Attends Religious Services: Not on file  . Active Member of Clubs or Organizations: Not on file  . Attends Archivist Meetings: Not on file  . Marital Status: Not on file   [name] Known Allergies Family History  Problem Relation Age of Onset  . Hyperlipidemia Mother   . Aneurysm Mother   . Hypertension Mother   . Diabetes Father   . Hyperlipidemia Father   . Hypertension Father   . Healthy Daughter   . Healthy Daughter     Current Outpatient Medications (Endocrine & Metabolic):  .  metFORMIN (GLUCOPHAGE) 1000 MG tablet, Take 1 tablet (1,000 mg total) by mouth 2 (two) times daily with a meal. .  pioglitazone (ACTOS) 45 MG tablet, TAKE 1 TABLET BY MOUTH DAILY.  Current Outpatient Medications (Cardiovascular):  .  amLODipine (NORVASC) 5 MG tablet, TAKE 1 TABLET (5 MG TOTAL) BY MOUTH DAILY. .  hydrochlorothiazide (HYDRODIURIL) 25 MG tablet, TAKE 1 TABLET (25 MG TOTAL) BY MOUTH DAILY. Marland Kitchen  losartan (COZAAR) 100 MG tablet, TAKE 1 TABLET BY MOUTH DAILY. .  simvastatin (ZOCOR) 20 MG tablet, TAKE 1 TABLET BY MOUTH DAILY. .  tadalafil (CIALIS) 20 MG tablet, TAKE 1 TABLET BY MOUTH DAILY AS NEEDED FOR ERECTILE DYSFUNCTION.  Current Outpatient Medications (Respiratory):  .  loratadine (CLARITIN) 10 MG tablet, Take 10 mg by mouth daily.  Current Outpatient  Medications (Analgesics):  .  aspirin EC 81 MG tablet, Take 81 mg by mouth daily. Marland Kitchen  HYDROcodone-acetaminophen (NORCO) 7.5-325 MG tablet, TAKE 1 TABLET BY MOUTH DAILY AS NEEDED FOR MODERATE PAIN. .  naproxen (NAPROSYN) 500 MG tablet, Take 1 tablet (500 mg total) by mouth 2 (two) times daily as needed.   Current Outpatient Medications (Other):  Marland Kitchen  DULoxetine (CYMBALTA) 60 MG capsule, TAKE 1 CAPSULE (60 MG TOTAL) BY MOUTH DAILY. .  hydroxychloroquine (PLAQUENIL) 200 MG tablet, TAKE 1 TABLET (200 MG TOTAL) BY MOUTH 2 (TWO) TIMES DAILY. .  Multiple Vitamin (MULTIVITAMIN WITH MINERALS) TABS tablet, Take 1 tablet by mouth daily. .  pantoprazole (PROTONIX) 40 MG  tablet, Take 40 mg by mouth daily. Marland Kitchen  tiZANidine (ZANAFLEX) 4 MG tablet, Take 1 tablet (4 mg total) by mouth at bedtime.   Reviewed prior external information including notes and imaging from  primary care provider As well as notes that were available from care everywhere and other healthcare systems.  Past medical history, social, surgical and family history all reviewed in electronic medical record.  [name] pertanent information unless stated regarding to the chief complaint.   Review of Systems:  [name] headache, visual changes, nausea, vomiting, diarrhea, constipation, dizziness, abdominal pain, skin rash, fevers, chills, night sweats, weight loss, swollen lymph nodes, body aches, joint swelling, chest pain, shortness of breath, mood changes. POSITIVE muscle aches  Objective  Blood pressure 124/82, pulse 61, height 6' (1.829 m), weight 256 lb (116.1 kg), SpO2 97 %.   General: [name] apparent distress alert and oriented x3 mood and affect normal, dressed appropriately.  HEENT: Pupils equal, extraocular movements intact  Respiratory: Patient's speak in full sentences and does not appear short of breath  Cardiovascular: [name] lower extremity edema, non tender, [name] erythema  Right shoulder exam shows the patient does have positive impingement.   Tenderness to palpation over the acromioclavicular joint.  4+ out of 5 strength of the rotator cuff near symmetric to the contralateral side.  Negative empty can.  Mild positive O'Brien's.  5-5 strength of the hands noted  Limited musculoskeletal ultrasound was performed and interpreted Lyndal Pulley  Limited ultrasound of patient's right shoulder shows the patient does have trace fluid noted of the acromioclavicular joint.  Patient does have some mild cortical irregularity of the distal aspect of the clavicle at the acromioclavicular.  Mild increase in Doppler flow.  Patient's rotator cuff does have some hypoechoic changes but [name] true acute tear noted.  [name] cortical irregularity of the humerus noted. Impression: Likely an acromioclavicular sprain with questionable irregularity of the distal clavicle   Impression and Recommendations:     The above documentation has been reviewed and is accurate and complete Lyndal Pulley, DO

## 2020-03-23 NOTE — Patient Instructions (Signed)
Happy holidays! Ice 20 minutes 2 times daily. Usually after activity and before bed. Keep hands within peripheral vison  Xray on way out.  pennsaid pinkie amount topically 2 times daily as needed.   Avoid heavy lifting See me again as scheduled for the back and we will look at the shoulder as well

## 2020-03-23 NOTE — Assessment & Plan Note (Signed)
I believe the patient does have more of a acromioclavicular sprain.  Rotator cuff appears to be intact on ultrasound today.  Patient does not have any significant weakness.  Patient does have more pain with crossover.  Discussed topical anti-inflammatories, icing regimen, home exercises.  We will follow-up in 3 to 4 weeks when patient was scheduled to follow-up for his back and further assess as well.  Patient was given home exercises with formal athletic trainer today.

## 2020-03-24 ENCOUNTER — Encounter: Payer: Self-pay | Admitting: Family Medicine

## 2020-03-25 MED FILL — TADALAFIL 20 MG TABS: 20 | 30 days supply | Qty: 6 | Fill #0

## 2020-03-31 NOTE — Progress Notes (Deleted)
[place] [address] Adam [name] Phone: [phone] Subjective:    I'm seeing this patient by the request  of:  Axel Filler, MD  CC:   [id number]   03/23/2020 I believe the patient does have more of a acromioclavicular sprain.  Rotator cuff appears to be intact on ultrasound today.  Patient does not have any significant weakness.  Patient does have more pain with crossover.  Discussed topical anti-inflammatories, icing regimen, home exercises.  We will follow-up in 3 to 4 weeks when patient was scheduled to follow-up for his back and further assess as well.  Patient was given home exercises with formal athletic trainer today.  Update 03/31/2020 Adam [name] is a 49 y.o. male coming in with complaint of AC joint sprain.      Past Medical History:  Diagnosis Date  . Anxiety   . Arthritis    lower back, hands  . Deviated septum    nasal turbinate hypertrophy  . Hypertension    states under control with meds., has been on medication since age 37  . Insulin dependent diabetes mellitus    type 2 DM  . Morbid obesity ([place])   . Sleep apnea    uses CPAP "most of the time", per pt.   Past Surgical History:  Procedure Laterality Date  . CARPAL TUNNEL RELEASE Right 07/2014  . LAPAROSCOPIC GASTRIC SLEEVE RESECTION     Dr. Excell Seltzer 09-09-17  . LAPAROSCOPIC GASTRIC SLEEVE RESECTION N/A 09/09/2017   Procedure: LAPAROSCOPIC GASTRIC SLEEVE RESECTION  AND ERAS PATHWAY;  Surgeon: Excell Seltzer, MD;  Location: [facility];  Service: General;  Laterality: N/A;  . LUMBAR FUSION    . NASAL SEPTOPLASTY W/ TURBINOPLASTY Bilateral 03/22/2017   Procedure: NASAL SEPTOPLASTY WITH  BILATERAL INFERIOR   TURBINATE REDUCTION;  Surgeon: Jerrell Belfast, MD;  Location: [facility];  Service: ENT;  Laterality: Bilateral;  . SHOULDER ARTHROSCOPY Left    x 2  . TONSILLECTOMY AND ADENOIDECTOMY    . TYMPANOSTOMY TUBE PLACEMENT Bilateral   . WISDOM  TOOTH EXTRACTION     Social History   Socioeconomic History  . Marital status: Married    Spouse name: Not on file  . Number of children: Not on file  . Years of education: Not on file  . Highest education level: Not on file  Occupational History  . Not on file  Tobacco Use  . Smoking status: Never Smoker  . Smokeless tobacco: Former Systems developer    Types: Secondary school teacher  . Vaping Use: Never used  Substance and Sexual Activity  . Alcohol use: Yes    Alcohol/week: 14.0 standard drinks    Types: 14 Standard drinks or equivalent per week    Comment: 2 drinks/day  . Drug use: No  . Sexual activity: Yes  Other Topics Concern  . Not on file  Social History Narrative  . Not on file   Social Determinants of Health   Financial Resource Strain: Not on file  Food Insecurity: Not on file  Transportation Needs: Not on file  Physical Activity: Not on file  Stress: Not on file  Social Connections: Not on file   No Known Allergies Family History  Problem Relation Age of Onset  . Hyperlipidemia Mother   . Aneurysm Mother   . Hypertension Mother   . Diabetes Father   . Hyperlipidemia Father   . Hypertension Father   . Healthy Daughter   . Healthy Daughter  Current Outpatient Medications (Endocrine & Metabolic):  .  metFORMIN (GLUCOPHAGE) 1000 MG tablet, Take 1 tablet (1,000 mg total) by mouth 2 (two) times daily with a meal. .  pioglitazone (ACTOS) 45 MG tablet, TAKE 1 TABLET BY MOUTH DAILY.  Current Outpatient Medications (Cardiovascular):  .  amLODipine (NORVASC) 5 MG tablet, TAKE 1 TABLET (5 MG TOTAL) BY MOUTH DAILY. .  hydrochlorothiazide (HYDRODIURIL) 25 MG tablet, TAKE 1 TABLET (25 MG TOTAL) BY MOUTH DAILY. Marland Kitchen  losartan (COZAAR) 100 MG tablet, TAKE 1 TABLET BY MOUTH DAILY. .  simvastatin (ZOCOR) 20 MG tablet, TAKE 1 TABLET BY MOUTH DAILY. .  tadalafil (CIALIS) 20 MG tablet, TAKE 1 TABLET BY MOUTH DAILY AS NEEDED FOR ERECTILE DYSFUNCTION.  Current Outpatient Medications  (Respiratory):  .  loratadine (CLARITIN) 10 MG tablet, Take 10 mg by mouth daily.  Current Outpatient Medications (Analgesics):  .  aspirin EC 81 MG tablet, Take 81 mg by mouth daily. Marland Kitchen  HYDROcodone-acetaminophen (NORCO) 7.5-325 MG tablet, TAKE 1 TABLET BY MOUTH DAILY AS NEEDED FOR MODERATE PAIN. .  naproxen (NAPROSYN) 500 MG tablet, Take 1 tablet (500 mg total) by mouth 2 (two) times daily as needed.   Current Outpatient Medications (Other):  Marland Kitchen  DULoxetine (CYMBALTA) 60 MG capsule, TAKE 1 CAPSULE (60 MG TOTAL) BY MOUTH DAILY. .  hydroxychloroquine (PLAQUENIL) 200 MG tablet, TAKE 1 TABLET (200 MG TOTAL) BY MOUTH 2 (TWO) TIMES DAILY. .  Multiple Vitamin (MULTIVITAMIN WITH MINERALS) TABS tablet, Take 1 tablet by mouth daily. .  pantoprazole (PROTONIX) 40 MG tablet, Take 40 mg by mouth daily. Marland Kitchen  tiZANidine (ZANAFLEX) 4 MG tablet, Take 1 tablet (4 mg total) by mouth at bedtime.   Reviewed prior external information including notes and imaging from  primary care provider As well as notes that were available from care everywhere and other healthcare systems.  Past medical history, social, surgical and family history all reviewed in electronic medical record.  No pertanent information unless stated regarding to the chief complaint.   Review of Systems:  No headache, visual changes, nausea, vomiting, diarrhea, constipation, dizziness, abdominal pain, skin rash, fevers, chills, night sweats, weight loss, swollen lymph nodes, body aches, joint swelling, chest pain, shortness of breath, mood changes. POSITIVE muscle aches  Objective  There were no vitals taken for this visit.   General: No apparent distress alert and oriented x3 mood and affect normal, dressed appropriately.  HEENT: Pupils equal, extraocular movements intact  Respiratory: Patient's speak in full sentences and does not appear short of breath  Cardiovascular: No lower extremity edema, non tender, no erythema  Neuro: Cranial  nerves II through XII are intact, neurovascularly intact in all extremities with 2+ DTRs and 2+ pulses.  Gait normal with good balance and coordination.  MSK:  Non tender with full range of motion and good stability and symmetric strength and tone of shoulders, elbows, wrist, hip, knee and ankles bilaterally.     Impression and Recommendations:     The above documentation has been reviewed and is accurate and complete Jacqualin Combes

## 2020-04-01 ENCOUNTER — Encounter (HOSPITAL_COMMUNITY): Payer: Self-pay

## 2020-04-01 ENCOUNTER — Ambulatory Visit: Payer: 59 | Admitting: Family Medicine

## 2020-04-06 MED FILL — SIMVASTATIN 20 MG TABLET: 20 | 90 days supply | Qty: 90 | Fill #0

## 2020-04-08 MED FILL — tiZANidine HCL 4 MG TABS: 4 | 30 days supply | Qty: 30 | Fill #0

## 2020-04-11 ENCOUNTER — Other Ambulatory Visit: Payer: Self-pay | Admitting: Student in an Organized Health Care Education/Training Program

## 2020-04-11 ENCOUNTER — Other Ambulatory Visit: Payer: Self-pay

## 2020-04-11 ENCOUNTER — Telehealth: Payer: Self-pay

## 2020-04-11 DIAGNOSIS — M47816 Spondylosis without myelopathy or radiculopathy, lumbar region: Secondary | ICD-10-CM

## 2020-04-11 MED ORDER — HYDROCODONE-ACETAMINOPHEN 7.5-325 MG PO TABS: 1.0000 | ORAL_TABLET | Freq: Two times a day (BID) | ORAL | 0 refills | Status: DC | PRN

## 2020-04-11 MED ORDER — HYDROCODONE-ACETAMINOPHEN 7.5-325 MG PO TABS: 1.0000 | ORAL_TABLET | Freq: Every day | ORAL | 0 refills | Status: DC | PRN

## 2020-04-11 NOTE — Telephone Encounter (Signed)
Pt calls and states he has been getting a 45 day supply of pain med. He needs the 45 to be for 30 days now, he using more because he is in pain due to an injury. So he wants you to send a new script stating the 45 are for 30 days not 45. Per the pharmacy he got 45 11/22 and he is out of pills.

## 2020-04-11 NOTE — Telephone Encounter (Signed)
Not sure I have to write anything special on the script for this. It is just a script for #45 with no refills on it. Can you follow up with the pharmacy to see if there are any issues we can resolve?

## 2020-04-11 NOTE — Telephone Encounter (Signed)
Requesting to speak with a nurse about  HYDROcodone-acetaminophen ([place]) 7.5-325 MG tablet. Please call pt back.

## 2020-04-11 NOTE — Telephone Encounter (Signed)
Pls contact pt regarding medicine issues [phone]

## 2020-04-12 MED FILL — HYDROCODON-APAP 7.5-325: 7.5-325 | 30 days supply | Qty: 45 | Fill #0

## 2020-04-12 NOTE — Telephone Encounter (Signed)
Adam [name] w/ [facility] called regarding pain rx, said she spoke with Bonnita Nasuti about this yesterday and asked if Bonnita Nasuti can call her back about new RX which was sent. SChaplin, RN,BSN

## 2020-04-25 MED FILL — PIOGLITAZONE HCL 45 MG TAB: 45 | 90 days supply | Qty: 90 | Fill #0

## 2020-04-25 NOTE — Progress Notes (Signed)
[place] Phone: [phone] Subjective:   Adam [name], am serving as a scribe for Dr. Hulan Saas. This visit occurred during the SARS-CoV-2 public health emergency.  Safety protocols were in place, including screening questions prior to the visit, additional usage of staff PPE, and extensive cleaning of exam room while observing appropriate contact time as indicated for disinfecting solutions.   I'm seeing this patient by the request  of:  Axel Filler, MD  CC: Shoulder pain follow-up  [id number]   03/23/2020  believe the patient does have more of a acromioclavicular sprain.  Rotator cuff appears to be intact on ultrasound today.  Patient does not have any significant weakness.  Patient does have more pain with crossover.  Discussed topical anti-inflammatories, icing regimen, home exercises.  We will follow-up in 3 to 4 weeks when patient was scheduled to follow-up for his back and further assess as well.  Patient was given home exercises with formal athletic trainer today.  Update 04/27/2019 Adam [name] is a 50 y.o. male coming in with complaint of right shoulder pain. Has been working on exercises and they are not causing any additional pain.  Patient would state only approximately 20% better at this point.  Still having discomfort and pain on a daily basis.  Past medical history is significant for rheumatoid arthritis.   xray show mild AC spurring independently visualized and interpreted by me today   Patient is on chronic pain meds by PCP  Patient has been seen for low back pain as well.  Patient does have mild multilevel degenerative disc disease and does have a fusion at L5-S1.  Patient states that the manipulation has been helpful previously.  He describes the pain as a dull, throbbing aching pain.  States that the pain medications he is on does help somewhat.  He does feel that the  manipulation has helped more than anything else doing the exercises occasionally  Past Medical History:  Diagnosis Date  . Anxiety   . Arthritis    lower back, hands  . Deviated septum    nasal turbinate hypertrophy  . Hypertension    states under control with meds., has been on medication since age 56  . Insulin dependent diabetes mellitus    type 2 DM  . Morbid obesity ([place])   . Sleep apnea    uses CPAP "most of the time", per pt.   Past Surgical History:  Procedure Laterality Date  . CARPAL TUNNEL RELEASE Right 07/2014  . LAPAROSCOPIC GASTRIC SLEEVE RESECTION     Dr. Excell Seltzer 09-09-17  . LAPAROSCOPIC GASTRIC SLEEVE RESECTION N/A 09/09/2017   Procedure: LAPAROSCOPIC GASTRIC SLEEVE RESECTION  AND ERAS PATHWAY;  Surgeon: Excell Seltzer, MD;  Location: [facility];  Service: General;  Laterality: N/A;  . LUMBAR FUSION    . NASAL SEPTOPLASTY W/ TURBINOPLASTY Bilateral 03/22/2017   Procedure: NASAL SEPTOPLASTY WITH  BILATERAL INFERIOR   TURBINATE REDUCTION;  Surgeon: Jerrell Belfast, MD;  Location: [facility];  Service: ENT;  Laterality: Bilateral;  . SHOULDER ARTHROSCOPY Left    x 2  . TONSILLECTOMY AND ADENOIDECTOMY    . TYMPANOSTOMY TUBE PLACEMENT Bilateral   . WISDOM TOOTH EXTRACTION     Social History   Socioeconomic History  . Marital status: Married    Spouse name: Not on file  . Number of children: Not on file  . Years of education: Not on file  . Highest education level:  Not on file  Occupational History  . Not on file  Tobacco Use  . Smoking status: Never Smoker  . Smokeless tobacco: Former Neurosurgeon    Types: Engineer, drilling  . Vaping Use: Never used  Substance and Sexual Activity  . Alcohol use: Yes    Alcohol/week: 14.0 standard drinks    Types: 14 Standard drinks or equivalent per week    Comment: 2 drinks/day  . Drug use: [name]  . Sexual activity: Yes  Other Topics Concern  . Not on file  Social History Narrative  . Not on file   Social Determinants of  Health   Financial Resource Strain: Not on file  Food Insecurity: Not on file  Transportation Needs: Not on file  Physical Activity: Not on file  Stress: Not on file  Social Connections: Not on file   [name] Known Allergies Family History  Problem Relation Age of Onset  . Hyperlipidemia Mother   . Aneurysm Mother   . Hypertension Mother   . Diabetes Father   . Hyperlipidemia Father   . Hypertension Father   . Healthy Daughter   . Healthy Daughter     Current Outpatient Medications (Endocrine & Metabolic):  .  metFORMIN (GLUCOPHAGE) 1000 MG tablet, Take 1 tablet (1,000 mg total) by mouth 2 (two) times daily with a meal. .  pioglitazone (ACTOS) 45 MG tablet, TAKE 1 TABLET BY MOUTH DAILY.  Current Outpatient Medications (Cardiovascular):  .  amLODipine (NORVASC) 5 MG tablet, TAKE 1 TABLET (5 MG TOTAL) BY MOUTH DAILY. .  hydrochlorothiazide (HYDRODIURIL) 25 MG tablet, TAKE 1 TABLET (25 MG TOTAL) BY MOUTH DAILY. Marland Kitchen  losartan (COZAAR) 100 MG tablet, TAKE 1 TABLET BY MOUTH DAILY. .  simvastatin (ZOCOR) 20 MG tablet, TAKE 1 TABLET BY MOUTH DAILY. .  tadalafil (CIALIS) 20 MG tablet, TAKE 1 TABLET BY MOUTH DAILY AS NEEDED FOR ERECTILE DYSFUNCTION.  Current Outpatient Medications (Respiratory):  .  loratadine (CLARITIN) 10 MG tablet, Take 10 mg by mouth daily.  Current Outpatient Medications (Analgesics):  .  aspirin EC 81 MG tablet, Take 81 mg by mouth daily. Marland Kitchen  HYDROcodone-acetaminophen (NORCO) 7.5-325 MG tablet, Take 1 tablet by mouth 2 (two) times daily as needed for moderate pain. .  naproxen (NAPROSYN) 500 MG tablet, Take 1 tablet (500 mg total) by mouth 2 (two) times daily as needed.   Current Outpatient Medications (Other):  Marland Kitchen  DULoxetine (CYMBALTA) 60 MG capsule, TAKE 1 CAPSULE (60 MG TOTAL) BY MOUTH DAILY. .  hydroxychloroquine (PLAQUENIL) 200 MG tablet, TAKE 1 TABLET (200 MG TOTAL) BY MOUTH 2 (TWO) TIMES DAILY. .  Multiple Vitamin (MULTIVITAMIN WITH MINERALS) TABS tablet, Take  1 tablet by mouth daily. .  pantoprazole (PROTONIX) 40 MG tablet, Take 40 mg by mouth daily. Marland Kitchen  tiZANidine (ZANAFLEX) 4 MG tablet, Take 1 tablet (4 mg total) by mouth at bedtime.   Reviewed prior external information including notes and imaging from  primary care provider As well as notes that were available from care everywhere and other healthcare systems.  Past medical history, social, surgical and family history all reviewed in electronic medical record.  [name] pertanent information unless stated regarding to the chief complaint.   Review of Systems:  [name] headache, visual changes, nausea, vomiting, diarrhea, constipation, dizziness, abdominal pain, skin rash, fevers, chills, night sweats, weight loss, swollen lymph nodes, body aches, joint swelling, chest pain, shortness of breath, mood changes. POSITIVE muscle aches  Objective  Blood pressure 120/82, pulse 79, height  6' (1.829 m), weight 240 lb (108.9 kg), SpO2 97 %.   General: [name] apparent distress alert and oriented x3 mood and affect normal, dressed appropriately.  HEENT: Pupils equal, extraocular movements intact  Respiratory: Patient's speak in full sentences and does not appear short of breath  Cardiovascular: [name] lower extremity edema, non tender, [name] erythema  Right shoulder exam still shows a positive crossover that is fairly severe.  Patient does have tenderness over the acromioclavicular joint.  Rotator cuff strength 4+ out of 5 But does have pain with testing.  Mild positive O'Brien's as well.  Low back exam does have some mild loss of lordosis.  Some tenderness to palpation in the low back L5-S1 bilaterally.  Mild tenderness with straight leg test and FABER test.  Procedure: Real-time Ultrasound Guided Injection of right subacromial space Device: GE Logiq Q7  Ultrasound guided injection is preferred based studies that show increased duration, increased effect, greater accuracy, decreased procedural pain, increased response rate  with ultrasound guided versus blind injection.  Verbal informed consent obtained.  Time-out conducted.  Noted [name] overlying erythema, induration, or other signs of local infection.  Skin prepped in a sterile fashion.  Local anesthesia: Topical Ethyl chloride.  With sterile technique and under real time ultrasound guidance:  Joint visualized.  23g 1  inch needle inserted lateral approach. Pictures taken for needle placement. Patient did have injection of, 2 cc of 0.5% Marcaine, and 1.0 cc of Kenalog 40 mg/dL. Completed without difficulty  Pain immediately resolved suggesting accurate placement of the medication.  Advised to call if fevers/chills, erythema, induration, drainage, or persistent bleeding.  Impression: Technically successful ultrasound guided injection.  Procedure: Real-time Ultrasound Guided Injection of right acromioclavicular joint Device: GE Logiq Q7 Ultrasound guided injection is preferred based studies that show increased duration, increased effect, greater accuracy, decreased procedural pain, increased response rate, and decreased cost with ultrasound guided versus blind injection.  Verbal informed consent obtained.  Time-out conducted.  Noted [name] overlying erythema, induration, or other signs of local infection.  Skin prepped in a sterile fashion.  Local anesthesia: Topical Ethyl chloride.  With sterile technique and under real time ultrasound guidance: With a 25-gauge half inch needle injected with 0.5 cc of 0.5% Marcaine and 0.5 cc of Kenalog 40 mg/mL into the [facility] joint superiorly Completed without difficulty  Pain immediately resolved suggesting accurate placement of the medication.  Advised to call if fevers/chills, erythema, induration, drainage, or persistent bleeding.  Impression: Technically successful ultrasound guided injection.  Osteopathic findings C5 flexed rotated and side bent left T7 extended rotated and side bent right L4 flexed rotated and side bent  left Sacrum appears right on right   Impression and Recommendations:     The above documentation has been reviewed and is accurate and complete Lyndal Pulley, DO

## 2020-04-26 ENCOUNTER — Ambulatory Visit (INDEPENDENT_AMBULATORY_CARE_PROVIDER_SITE_OTHER): Payer: 59 | Admitting: Family Medicine

## 2020-04-26 ENCOUNTER — Ambulatory Visit: Payer: Self-pay

## 2020-04-26 ENCOUNTER — Encounter: Payer: Self-pay | Admitting: Family Medicine

## 2020-04-26 ENCOUNTER — Other Ambulatory Visit: Payer: Self-pay

## 2020-04-26 VITALS — BP 120/82 | HR 79 | Ht 72.0 in | Wt 240.0 lb

## 2020-04-26 DIAGNOSIS — M47816 Spondylosis without myelopathy or radiculopathy, lumbar region: Secondary | ICD-10-CM

## 2020-04-26 DIAGNOSIS — M778 Other enthesopathies, not elsewhere classified: Secondary | ICD-10-CM | POA: Diagnosis not present

## 2020-04-26 DIAGNOSIS — M25511 Pain in right shoulder: Secondary | ICD-10-CM

## 2020-04-26 DIAGNOSIS — M999 Biomechanical lesion, unspecified: Secondary | ICD-10-CM | POA: Diagnosis not present

## 2020-04-26 DIAGNOSIS — G8929 Other chronic pain: Secondary | ICD-10-CM | POA: Diagnosis not present

## 2020-04-26 DIAGNOSIS — M7581 Other shoulder lesions, right shoulder: Secondary | ICD-10-CM

## 2020-04-26 DIAGNOSIS — S4351XA Sprain of right acromioclavicular joint, initial encounter: Secondary | ICD-10-CM

## 2020-04-26 NOTE — Assessment & Plan Note (Signed)
Patient given injection today.  Tolerated the procedure well.  Discussed formal physical therapy.  Discussed continuing home exercises.  Patient having worsening symptoms after all of this will need to consider the possibility of MRI.  Patient would consider surgical intervention if necessary.

## 2020-04-26 NOTE — Assessment & Plan Note (Signed)
Attempted osteopathic manipulation again today.  Patient feels like he is responding.  Patient will continue with the Zanaflex.  Patient initially did take the gabapentin but did not notice improvement and has discontinued.  Continue the Cymbalta at this point from primary care provider.  Follow-up with me again 6 weeks

## 2020-04-26 NOTE — Patient Instructions (Signed)
2 injections today Give it about 3 days then restart exercises Ice is a good idea See me again in 4-6 weeks if not better MRI or PT

## 2020-04-26 NOTE — Assessment & Plan Note (Signed)
Patient given injection and tolerated the procedure well.  Patient has other modalities and will continue with the home exercises.  Hopefully patient will respond well to this injection.  Patient on ultrasound does have some findings that is consistent with potentially more of a rotator cuff tendinopathy and we will monitor.  Worsening weakness or pain we do need to consider advanced imaging.  Patient will follow up with me again in 4 to 6 weeks.

## 2020-04-27 ENCOUNTER — Other Ambulatory Visit: Payer: Self-pay | Admitting: Student in an Organized Health Care Education/Training Program

## 2020-05-02 DIAGNOSIS — U071 COVID-19: Secondary | ICD-10-CM | POA: Diagnosis not present

## 2020-05-10 ENCOUNTER — Other Ambulatory Visit: Payer: Self-pay | Admitting: Family Medicine

## 2020-05-10 ENCOUNTER — Other Ambulatory Visit: Payer: Self-pay

## 2020-05-10 ENCOUNTER — Other Ambulatory Visit: Payer: Self-pay | Admitting: Student in an Organized Health Care Education/Training Program

## 2020-05-10 ENCOUNTER — Telehealth: Payer: Self-pay | Admitting: Family Medicine

## 2020-05-10 DIAGNOSIS — G8929 Other chronic pain: Secondary | ICD-10-CM

## 2020-05-10 MED FILL — HYDROCHLOROTHIAZIDE 25 MG T: 25 | 90 days supply | Qty: 90 | Fill #1

## 2020-05-10 MED FILL — AMLODIPINE BESYLATE 5 MG TA: 5 | 90 days supply | Qty: 90 | Fill #1

## 2020-05-10 MED FILL — LOSARTAN POTASSIUM 100 MG T: 100 | 30 days supply | Qty: 30 | Fill #3

## 2020-05-10 MED FILL — DULOXETINE HCL 60 MG CPEP: 60 | 90 days supply | Qty: 90 | Fill #0

## 2020-05-10 MED FILL — TADALAFIL 20 MG TABS: 20 | 30 days supply | Qty: 6 | Fill #0

## 2020-05-10 MED FILL — tiZANidine HCL 4 MG TABS: 4 | 30 days supply | Qty: 30 | Fill #0

## 2020-05-10 NOTE — Telephone Encounter (Signed)
Patient called stating that he is still having continued shoulder pain and would like to proceed with the MRI.

## 2020-05-10 NOTE — Telephone Encounter (Signed)
MRI ordered. Message sent via mychart.

## 2020-05-11 ENCOUNTER — Other Ambulatory Visit: Payer: Self-pay | Admitting: Family Medicine

## 2020-05-11 DIAGNOSIS — Z77018 Contact with and (suspected) exposure to other hazardous metals: Secondary | ICD-10-CM

## 2020-05-12 MED FILL — HYDROCODON-APAP 7.5-325: 7.5-325 | 30 days supply | Qty: 45 | Fill #0

## 2020-05-12 MED FILL — METFORMIN HCL 1000 MG TABS: 1000 | 90 days supply | Qty: 180 | Fill #2

## 2020-05-19 ENCOUNTER — Other Ambulatory Visit: Payer: 59

## 2020-05-27 ENCOUNTER — Ambulatory Visit: Payer: 59 | Admitting: Family Medicine

## 2020-05-27 NOTE — Progress Notes (Deleted)
Adam [name] [place] Phone: [phone] Subjective:    I'm seeing this patient by the request  of:  Adam Filler, MD  CC: Shoulder pain follow-up, low back follow-up  [id number]   04/26/2020 Attempted osteopathic manipulation again today.  Patient feels like he is responding.  Patient will continue with the Zanaflex.  Patient initially did take the gabapentin but did not notice improvement and has discontinued.  Continue the Cymbalta at this point from primary care provider.  Follow-up with me again 6 weeks Update 05/27/2020 Adam [name] is a 50 y.o. male coming in with complaint of right shoulder and back pain. OMT last visit.   -   Patient has an MRI scheduled for June 07, 2020  Patient has had lumbar x-rays previously in November 2021.  Patient was found to have mild degenerative disc disease throughout the lumbar spine postsurgical fusion noted at L5-S1  Past Medical History:  Diagnosis Date  . Anxiety   . Arthritis    lower back, hands  . Deviated septum    nasal turbinate hypertrophy  . Hypertension    states under control with meds., has been on medication since age 63  . Insulin dependent diabetes mellitus    type 2 DM  . Morbid obesity ([place])   . Sleep apnea    uses CPAP "most of the time", per pt.   Past Surgical History:  Procedure Laterality Date  . CARPAL TUNNEL RELEASE Right 07/2014  . LAPAROSCOPIC GASTRIC SLEEVE RESECTION     Dr. Excell [name] 09-09-17  . LAPAROSCOPIC GASTRIC SLEEVE RESECTION N/A 09/09/2017   Procedure: LAPAROSCOPIC GASTRIC SLEEVE RESECTION  AND ERAS PATHWAY;  Surgeon: Adam Seltzer, MD;  Location: [facility];  Service: General;  Laterality: N/A;  . LUMBAR FUSION    . NASAL SEPTOPLASTY W/ TURBINOPLASTY Bilateral 03/22/2017   Procedure: NASAL SEPTOPLASTY WITH  BILATERAL INFERIOR   TURBINATE REDUCTION;  Surgeon: Adam Belfast, MD;  Location: [place];  Service: ENT;   Laterality: Bilateral;  . SHOULDER ARTHROSCOPY Left    x 2  . TONSILLECTOMY AND ADENOIDECTOMY    . TYMPANOSTOMY TUBE PLACEMENT Bilateral   . WISDOM TOOTH EXTRACTION     Social History   Socioeconomic History  . Marital status: Married    Spouse name: Not on file  . Number of children: Not on file  . Years of education: Not on file  . Highest education level: Not on file  Occupational History  . Not on file  Tobacco Use  . Smoking status: Never Smoker  . Smokeless tobacco: Former Systems developer    Types: Secondary school teacher  . Vaping Use: Never used  Substance and Sexual Activity  . Alcohol use: Yes    Alcohol/week: 14.0 standard drinks    Types: 14 Standard drinks or equivalent per week    Comment: 2 drinks/day  . Drug use: No  . Sexual activity: Yes  Other Topics Concern  . Not on file  Social History Narrative  . Not on file   Social Determinants of Health   Financial Resource Strain: Not on file  Food Insecurity: Not on file  Transportation Needs: Not on file  Physical Activity: Not on file  Stress: Not on file  Social Connections: Not on file   No Known Allergies Family History  Problem Relation Age of Onset  . Hyperlipidemia Mother   . Aneurysm Mother   . Hypertension Mother   . Diabetes Father   .  Hyperlipidemia Father   . Hypertension Father   . Healthy Daughter   . Healthy Daughter     Current Outpatient Medications (Endocrine & Metabolic):  .  metFORMIN (GLUCOPHAGE) 1000 MG tablet, Take 1 tablet (1,000 mg total) by mouth 2 (two) times daily with a meal. .  pioglitazone (ACTOS) 45 MG tablet, TAKE 1 TABLET BY MOUTH DAILY.  Current Outpatient Medications (Cardiovascular):  .  amLODipine (NORVASC) 5 MG tablet, TAKE 1 TABLET (5 MG TOTAL) BY MOUTH DAILY. .  hydrochlorothiazide (HYDRODIURIL) 25 MG tablet, TAKE 1 TABLET (25 MG TOTAL) BY MOUTH DAILY. Marland Kitchen  losartan (COZAAR) 100 MG tablet, TAKE 1 TABLET BY MOUTH DAILY. .  simvastatin (ZOCOR) 20 MG tablet, TAKE 1 TABLET  BY MOUTH DAILY. .  tadalafil (CIALIS) 20 MG tablet, TAKE 1 TABLET BY MOUTH DAILY AS NEEDED FOR ERECTILE DYSFUNCTION.  Current Outpatient Medications (Respiratory):  .  loratadine (CLARITIN) 10 MG tablet, Take 10 mg by mouth daily.  Current Outpatient Medications (Analgesics):  .  HYDROcodone-acetaminophen (NORCO) 7.5-325 MG tablet, Take 1 tablet by mouth every 12 (twelve) hours as needed for moderate pain. This is a 30 day supply, ok to fill after 05/10/20 .  aspirin EC 81 MG tablet, Take 81 mg by mouth daily. Marland Kitchen  HYDROcodone-acetaminophen (NORCO) 7.5-325 MG tablet, Take 1 tablet by mouth 2 (two) times daily as needed for moderate pain. .  naproxen (NAPROSYN) 500 MG tablet, Take 1 tablet (500 mg total) by mouth 2 (two) times daily as needed.   Current Outpatient Medications (Other):  Marland Kitchen  DULoxetine (CYMBALTA) 60 MG capsule, TAKE 1 CAPSULE BY MOUTH ONCE A DAY .  hydroxychloroquine (PLAQUENIL) 200 MG tablet, TAKE 1 TABLET (200 MG TOTAL) BY MOUTH 2 (TWO) TIMES DAILY. .  Multiple Vitamin (MULTIVITAMIN WITH MINERALS) TABS tablet, Take 1 tablet by mouth daily. .  pantoprazole (PROTONIX) 40 MG tablet, Take 40 mg by mouth daily. Marland Kitchen  tiZANidine (ZANAFLEX) 4 MG tablet, TAKE 1 TABLET BY MOUTH AT BEDTIME   Reviewed prior external information including notes and imaging from  primary care provider As well as notes that were available from care everywhere and other healthcare systems.  Past medical history, social, surgical and family history all reviewed in electronic medical record.  No pertanent information unless stated regarding to the chief complaint.   Review of Systems:  No headache, visual changes, nausea, vomiting, diarrhea, constipation, dizziness, abdominal pain, skin rash, fevers, chills, night sweats, weight loss, swollen lymph nodes, body aches, joint swelling, chest pain, shortness of breath, mood changes. POSITIVE muscle aches  Objective  There were no vitals taken for this visit.    General: No apparent distress alert and oriented x3 mood and affect normal, dressed appropriately.  HEENT: Pupils equal, extraocular movements intact  Respiratory: Patient's speak in full sentences and does not appear short of breath  Cardiovascular: No lower extremity edema, non tender, no erythema  Gait normal with good balance and coordination.  MSK:  Non tender with full range of motion and good stability and symmetric strength and tone of shoulders, elbows, wrist, hip, knee and ankles bilaterally.     Impression and Recommendations:     The above documentation has been reviewed and is accurate and complete Lyndal Pulley, DO

## 2020-06-03 ENCOUNTER — Other Ambulatory Visit: Payer: Self-pay | Admitting: Family Medicine

## 2020-06-03 MED FILL — tiZANidine HCL 4 MG TABS: 4 | 30 days supply | Qty: 30 | Fill #0

## 2020-06-03 MED FILL — TADALAFIL 20 MG TABS: 20 | 30 days supply | Qty: 6 | Fill #1

## 2020-06-06 ENCOUNTER — Other Ambulatory Visit: Payer: Self-pay | Admitting: Family Medicine

## 2020-06-07 ENCOUNTER — Ambulatory Visit
Admission: RE | Admit: 2020-06-07 | Discharge: 2020-06-07 | Disposition: A | Discharge status: Home / Self Care | Payer: 59 | Source: Ambulatory Visit | Attending: Family Medicine | Admitting: Family Medicine

## 2020-06-07 ENCOUNTER — Other Ambulatory Visit: Payer: 59

## 2020-06-07 DIAGNOSIS — G8929 Other chronic pain: Secondary | ICD-10-CM

## 2020-06-07 DIAGNOSIS — M19011 Primary osteoarthritis, right shoulder: Secondary | ICD-10-CM | POA: Diagnosis not present

## 2020-06-08 NOTE — Progress Notes (Signed)
[place] Phone: [phone] Subjective:   [name], am serving as a scribe for Dr. Hulan Saas. This visit occurred during the SARS-CoV-2 public health emergency.  Safety protocols were in place, including screening questions prior to the visit, additional usage of staff PPE, and extensive cleaning of exam room while observing appropriate contact time as indicated for disinfecting solutions.   I'm seeing this patient by the request  of:  Axel Filler, MD  CC: Right shoulder pain follow-up  [id number]   04/26/2020 Attempted osteopathic manipulation again today.  Patient feels like he is responding.  Patient will continue with the Zanaflex.  Patient initially did take the gabapentin but did not notice improvement and has discontinued.  Continue the Cymbalta at this point from primary care provider.  Follow-up with me again 6 weeks  Patient given injection today.  Tolerated the procedure well.  Discussed formal physical therapy.  Discussed continuing home exercises.  Patient having worsening symptoms after all of this will need to consider the possibility of MRI.  Patient would consider surgical intervention if necessary.  Patient given injection and tolerated the procedure well.  Patient has other modalities and will continue with the home exercises.  Hopefully patient will respond well to this injection.  Patient on ultrasound does have some findings that is consistent with potentially more of a rotator cuff tendinopathy and we will monitor.  Worsening weakness or pain we do need to consider advanced imaging.  Patient will follow up with me again in 4 to 6 weeks.  Update 06/08/2020 Tahj Njoku is a 50 y.o. male coming in with complaint of right shoulder pain. Patient's pain is same as last visit. No worse. Is using zanaflex for relief.  Patient states it is still frustrating but not severe.  MRI  right shoulder 06/08/2020 IMPRESSION: Mild appearing supraspinatus and infraspinatus tendinopathy without tear.  Moderate acromioclavicular osteoarthritis.    Past Medical History:  Diagnosis Date  . Anxiety   . Arthritis    lower back, hands  . Deviated septum    nasal turbinate hypertrophy  . Hypertension    states under control with meds., has been on medication since age 41  . Insulin dependent diabetes mellitus    type 2 DM  . Morbid obesity ([place])   . Sleep apnea    uses CPAP "most of the time", per pt.   Past Surgical History:  Procedure Laterality Date  . CARPAL TUNNEL RELEASE Right 07/2014  . LAPAROSCOPIC GASTRIC SLEEVE RESECTION     Dr. Excell Seltzer 09-09-17  . LAPAROSCOPIC GASTRIC SLEEVE RESECTION N/A 09/09/2017   Procedure: LAPAROSCOPIC GASTRIC SLEEVE RESECTION  AND ERAS PATHWAY;  Surgeon: Excell Seltzer, MD;  Location: [facility];  Service: General;  Laterality: N/A;  . LUMBAR FUSION    . NASAL SEPTOPLASTY W/ TURBINOPLASTY Bilateral 03/22/2017   Procedure: NASAL SEPTOPLASTY WITH  BILATERAL INFERIOR   TURBINATE REDUCTION;  Surgeon: Jerrell Belfast, MD;  Location: [place];  Service: ENT;  Laterality: Bilateral;  . SHOULDER ARTHROSCOPY Left    x 2  . TONSILLECTOMY AND ADENOIDECTOMY    . TYMPANOSTOMY TUBE PLACEMENT Bilateral   . WISDOM TOOTH EXTRACTION     Social History   Socioeconomic History  . Marital status: Married    Spouse name: Not on file  . Number of children: Not on file  . Years of education: Not on file  . Highest education level: Not on file  Occupational History  . Not on file  Tobacco Use  . Smoking status: Never Smoker  . Smokeless tobacco: Former Systems developer    Types: Secondary school teacher  . Vaping Use: Never used  Substance and Sexual Activity  . Alcohol use: Yes    Alcohol/week: 14.0 standard drinks    Types: 14 Standard drinks or equivalent per week    Comment: 2 drinks/day  . Drug use: No  . Sexual activity: Yes  Other Topics Concern  . Not  on file  Social History Narrative  . Not on file   Social Determinants of Health   Financial Resource Strain: Not on file  Food Insecurity: Not on file  Transportation Needs: Not on file  Physical Activity: Not on file  Stress: Not on file  Social Connections: Not on file   No Known Allergies Family History  Problem Relation Age of Onset  . Hyperlipidemia Mother   . Aneurysm Mother   . Hypertension Mother   . Diabetes Father   . Hyperlipidemia Father   . Hypertension Father   . Healthy Daughter   . Healthy Daughter     Current Outpatient Medications (Endocrine & Metabolic):  .  metFORMIN (GLUCOPHAGE) 1000 MG tablet, Take 1 tablet (1,000 mg total) by mouth 2 (two) times daily with a meal. .  pioglitazone (ACTOS) 45 MG tablet, TAKE 1 TABLET BY MOUTH DAILY.  Current Outpatient Medications (Cardiovascular):  .  amLODipine (NORVASC) 5 MG tablet, TAKE 1 TABLET (5 MG TOTAL) BY MOUTH DAILY. .  hydrochlorothiazide (HYDRODIURIL) 25 MG tablet, TAKE 1 TABLET (25 MG TOTAL) BY MOUTH DAILY. Marland Kitchen  losartan (COZAAR) 100 MG tablet, TAKE 1 TABLET BY MOUTH DAILY. .  simvastatin (ZOCOR) 20 MG tablet, TAKE 1 TABLET BY MOUTH DAILY. .  tadalafil (CIALIS) 20 MG tablet, TAKE 1 TABLET BY MOUTH DAILY AS NEEDED FOR ERECTILE DYSFUNCTION.  Current Outpatient Medications (Respiratory):  .  loratadine (CLARITIN) 10 MG tablet, Take 10 mg by mouth daily.  Current Outpatient Medications (Analgesics):  .  aspirin EC 81 MG tablet, Take 81 mg by mouth daily. Marland Kitchen  HYDROcodone-acetaminophen (NORCO) 7.5-325 MG tablet, Take 1 tablet by mouth every 12 (twelve) hours as needed for moderate pain. This is a 30 day supply, ok to fill after 05/10/20 .  HYDROcodone-acetaminophen (NORCO) 7.5-325 MG tablet, Take 1 tablet by mouth 2 (two) times daily as needed for moderate pain. .  naproxen (NAPROSYN) 500 MG tablet, Take 1 tablet (500 mg total) by mouth 2 (two) times daily as needed.   Current Outpatient Medications (Other):   Marland Kitchen  DULoxetine (CYMBALTA) 60 MG capsule, TAKE 1 CAPSULE BY MOUTH ONCE A DAY .  hydroxychloroquine (PLAQUENIL) 200 MG tablet, TAKE 1 TABLET (200 MG TOTAL) BY MOUTH 2 (TWO) TIMES DAILY. .  Multiple Vitamin (MULTIVITAMIN WITH MINERALS) TABS tablet, Take 1 tablet by mouth daily. .  pantoprazole (PROTONIX) 40 MG tablet, Take 40 mg by mouth daily. Marland Kitchen  tiZANidine (ZANAFLEX) 4 MG tablet, TAKE 1 TABLET BY MOUTH AT BEDTIME   Reviewed prior external information including notes and imaging from  primary care provider As well as notes that were available from care everywhere and other healthcare systems.  Past medical history, social, surgical and family history all reviewed in electronic medical record.  No pertanent information unless stated regarding to the chief complaint.   Review of Systems:  No headache, visual changes, nausea, vomiting, diarrhea, constipation, dizziness, abdominal pain, skin rash, fevers, chills, night sweats, weight loss, swollen lymph  nodes, body aches, joint swelling, chest pain, shortness of breath, mood changes. POSITIVE muscle aches  Objective  Blood pressure 110/76, pulse 84, height 6' (1.829 m), weight 239 lb (108.4 kg), SpO2 98 %.   General: No apparent distress alert and oriented x3 mood and affect normal, dressed appropriately.  HEENT: Pupils equal, extraocular movements intact  Respiratory: Patient's speak in full sentences and does not appear short of breath  Cardiovascular: No lower extremity edema, non tender, no erythema  Gait normal with good balance and coordination.  MSK: Deferred evaluation today.  Patient was comfortable with sitting up.    Impression and Recommendations:     The above documentation has been reviewed and is accurate and complete Lyndal Pulley, DO

## 2020-06-09 ENCOUNTER — Other Ambulatory Visit: Payer: Self-pay | Admitting: Student in an Organized Health Care Education/Training Program

## 2020-06-09 ENCOUNTER — Other Ambulatory Visit: Payer: Self-pay

## 2020-06-09 ENCOUNTER — Ambulatory Visit (INDEPENDENT_AMBULATORY_CARE_PROVIDER_SITE_OTHER): Payer: 59 | Admitting: Family Medicine

## 2020-06-09 ENCOUNTER — Encounter: Payer: Self-pay | Admitting: Family Medicine

## 2020-06-09 DIAGNOSIS — M19011 Primary osteoarthritis, right shoulder: Secondary | ICD-10-CM | POA: Diagnosis not present

## 2020-06-09 DIAGNOSIS — M19019 Primary osteoarthritis, unspecified shoulder: Secondary | ICD-10-CM | POA: Insufficient documentation

## 2020-06-09 DIAGNOSIS — M47816 Spondylosis without myelopathy or radiculopathy, lumbar region: Secondary | ICD-10-CM

## 2020-06-09 MED FILL — HYDROCODON-APAP 7.5-325: 7.5-325 | 30 days supply | Qty: 45 | Fill #0

## 2020-06-09 MED FILL — LOSARTAN POTASSIUM 100 MG T: 100 | 30 days supply | Qty: 30 | Fill #4

## 2020-06-09 NOTE — Assessment & Plan Note (Signed)
Patient was found to have more of a acromioclavicular arthritis on the MRI.  Discussed with patient that if worsening pain can consider potential injections.  Discussed PRP, discussed though that I would highly suggest the patient consider the possibility of formal physical therapy.  Patient is having difficulty with work and would not have time to get off at this moment but may call if this changes.  We discussed keeping hands within peripheral vision and keeping the home exercises.  Follow-up with me again if patient decides on any of the other treatment options.

## 2020-06-09 NOTE — Patient Instructions (Signed)
Good to see you Seems to be the arthritis of the [facility] joint Consider PT or PRP If you want to do either give [place] a message through mychart Continue the exercises and icing After a few months if worsening pain we can consider steroid injetion See me again when you need me

## 2020-06-09 NOTE — Progress Notes (Addendum)
Office Visit Note  Patient: Adam [name]             Date of Birth: 1970-09-03           MRN: [id number]             PCP: Axel Filler, MD Referring: Axel Filler,* Visit Date: 06/20/2020 Occupation: @GUAROCC @  Subjective:  Other (Bilateral hip pain- patient has not taken PLQ in approximately 5 months. )   History of Present Illness: Adam [name] is a 50 y.o. male with a history of seropositive RA. RF on 11/10/2018 was 73.4 and anti-CCP was 4. He was taking Plaquenil 200 mg for RA, but has not taken it for the last 5 months. He reports he stopped taking the medication due it not help with his pain in addition to him quitting drinking. He says he was originally taking the medication because he was taking alcohol, but since he stopped drinking he no longer needed the medication. He rates his RA as a 7/10 due to hip pain. He denies any joint swelling with minimal warmth. He notes his right hand used to swell with drinking which has improved. He still has morning stiffness which affects his gait. He occasionally takes Tylenol or ibuprofen for the pain. He denies any new rashes, sicca symptoms or sores in his mouth or nose.   Today he has bilateral hip pain that has worsened in the last few months. The pain is worse on the left. He describes the pain as sharp pain that is constant. He rates the pain as a 7/10 and is constant. He has been taking Norco 7.5-325 mg as needed which helps decrease the pain for him to move around. The pain wakes him up at night and is aggravated when he lays on his side at night. He has had worsening fatigue over the few months, he is unsure if it truly has worsened or if it has just become more noticeable. He notes he has lost weight and his a1c and obstructive sleep apnea have improved.   He fell onto his right shoulder in 02/2021. He recently had an MRI of his right shoulder on 06/07/2020. It showed mild appearing supraspinatus and  infraspinatus tendinopathy without tear. Moderate acromioclavicular osteoarthritis.  He stopped drinking alcohol about 5 months ago. He has lost about 40 pounds since he quit.   Activities of Daily Living:  Patient reports morning stiffness for 3 hours.   Patient Reports nocturnal pain.  Difficulty dressing/grooming: Reports Difficulty climbing stairs: Denies Difficulty getting out of chair: Denies Difficulty using hands for taps, buttons, cutlery, and/or writing: Denies  Review of Systems  Constitutional: Positive for fatigue.  HENT: Negative for mouth sores, mouth dryness and nose dryness.   Eyes: Negative for pain, itching and dryness.  Respiratory: Negative for shortness of breath and difficulty breathing.   Cardiovascular: Negative for chest pain and palpitations.  Gastrointestinal: Negative for blood in stool, constipation and diarrhea.  Endocrine: Negative for increased urination.  Genitourinary: Negative for difficulty urinating.  Musculoskeletal: Positive for arthralgias, joint pain, myalgias, morning stiffness, muscle tenderness and myalgias. Negative for joint swelling.  Skin: Negative for color change, rash and redness.  Allergic/Immunologic: Negative for susceptible to infections.  Neurological: Negative for dizziness, numbness, headaches, memory loss and weakness.  Hematological: Negative for bruising/bleeding tendency.  Psychiatric/Behavioral: Negative for confusion.    PMFS History:  Patient Active Problem List   Diagnosis Date Noted  . AC (acromioclavicular) arthritis 06/09/2020  . Tendinitis  of right shoulder 04/26/2020  . Acromioclavicular sprain, right, initial encounter 03/23/2020  . Nonallopathic lesion of cervical region 03/03/2020  . Nonallopathic lesion of lumbar region 03/03/2020  . Nonallopathic lesion of sacral region 03/03/2020  . Neck pain 03/03/2020  . Alcohol use disorder, in early remission ([place]) 01/18/2020  . Rheumatoid arthritis ([place])  11/10/2018  . Hearing loss 11/12/2016  . Obesity, Class III, BMI 40-49.9 (morbid obesity) ([place]) 11/12/2016  . Osteoarthritis of lumbar spine 04/09/2016  . Tendinopathy of left rotator cuff 12/30/2015  . Obstructive sleep apnea 02/28/2015  . Type 2 diabetes mellitus with other specified complication ([place]) 79/89/2119  . Erectile dysfunction 01/06/2015  . Healthcare maintenance 01/06/2015  . Essential hypertension 01/06/2015    Past Medical History:  Diagnosis Date  . Anxiety   . Arthritis    lower back, hands  . Deviated septum    nasal turbinate hypertrophy  . Hypertension    states under control with meds., has been on medication since age 18  . Insulin dependent diabetes mellitus    type 2 DM  . Morbid obesity ([place])   . Sleep apnea    uses CPAP "most of the time", per pt.    Family History  Problem Relation Age of Onset  . Hyperlipidemia Mother   . Aneurysm Mother   . Hypertension Mother   . Diabetes Father   . Hyperlipidemia Father   . Hypertension Father   . Healthy Daughter   . Healthy Daughter    Past Surgical History:  Procedure Laterality Date  . CARPAL TUNNEL RELEASE Right 07/2014  . LAPAROSCOPIC GASTRIC SLEEVE RESECTION     Dr. Excell Seltzer 09-09-17  . LAPAROSCOPIC GASTRIC SLEEVE RESECTION N/A 09/09/2017   Procedure: LAPAROSCOPIC GASTRIC SLEEVE RESECTION  AND ERAS PATHWAY;  Surgeon: Excell Seltzer, MD;  Location: [facility];  Service: General;  Laterality: N/A;  . LUMBAR FUSION    . NASAL SEPTOPLASTY W/ TURBINOPLASTY Bilateral 03/22/2017   Procedure: NASAL SEPTOPLASTY WITH  BILATERAL INFERIOR   TURBINATE REDUCTION;  Surgeon: Jerrell Belfast, MD;  Location: [facility];  Service: ENT;  Laterality: Bilateral;  . SHOULDER ARTHROSCOPY Left    x 2  . TONSILLECTOMY AND ADENOIDECTOMY    . TYMPANOSTOMY TUBE PLACEMENT Bilateral   . WISDOM TOOTH EXTRACTION     Social History   Social History Narrative  . Not on file   Immunization History  Administered Date(s)  Administered  . Influenza Inj Mdck Quad Pf 01/10/2018  . Influenza,inj,Quad PF,6+ Mos 01/06/2015, 01/07/2017, 01/22/2019, 02/01/2020  . Influenza-Unspecified 03/10/2016  . PFIZER(Purple Top)SARS-COV-2 Vaccination 07/04/2019, 07/28/2019  . Pneumococcal Polysaccharide-23 01/06/2015  . Tdap 12/09/2010     Objective: Vital Signs: BP (!) 152/88 (BP Location: Left Arm, Patient Position: Sitting, Cuff Size: Normal)   Pulse 76   Resp 17   Ht 6' (1.829 m)   Wt 241 lb (109.3 kg)   BMI 32.69 kg/m    Physical Exam Constitutional:      Appearance: Normal appearance.  Cardiovascular:     Rate and Rhythm: Normal rate and regular rhythm.  Pulmonary:     Effort: Pulmonary effort is normal.  Abdominal:     Palpations: Abdomen is soft.     Tenderness: There is no abdominal tenderness.  Skin:    General: Skin is warm and dry.  Neurological:     Mental Status: He is alert and oriented to person, place, and time.  Psychiatric:        Mood and Affect: Mood normal.  Musculoskeletal Exam: Good ROM of C-spine with some tenderness when rotating to the right. No tenderness over thoracic and lumber spine. Good ROM of shoulders, elbows, wrist and hands bilaterally. No contractures of the elbow present. No signs of synovitis on DIPs and PIPs bilaterally. Knees had good ROM with no effusion or warmth present. Hip and ankles had good ROM. No tenderness when compressing MTPs bilaterally. Tenderness over trochanteric bursa bilaterally.   CDAI Exam: CDAI Score: - Patient Global: -; Provider Global: - Swollen: -; Tender: - Joint Exam 06/20/2020   No joint exam has been documented for this visit   There is currently no information documented on the homunculus. Go to the Rheumatology activity and complete the homunculus joint exam.  Investigation: No additional findings.  Imaging: MR SHOULDER RIGHT WO CONTRAST  Result Date: 06/08/2020 CLINICAL DATA:  Right shoulder pain and limited range of  motion since a fall in November, 2021. EXAM: MRI OF THE RIGHT SHOULDER WITHOUT CONTRAST TECHNIQUE: Multiplanar, multisequence MR imaging of the shoulder was performed. No intravenous contrast was administered. COMPARISON:  Plain films right shoulder 03/23/2020. FINDINGS: Rotator cuff: Intact. Mild appearing supraspinatus and infraspinatus tendinopathy noted. Muscles:  No focal atrophy or lesion. Biceps long head:  Intact. Acromioclavicular Joint: Moderate osteoarthritis. Type 2 acromion. No subacromial/subdeltoid bursal fluid. Glenohumeral Joint: Negative. Labrum:  Intact. Bones: No fracture or worrisome lesion. Small focus of heterotopic ossification superior to the acromion incidentally noted. Other: None. IMPRESSION: Mild appearing supraspinatus and infraspinatus tendinopathy without tear. Moderate acromioclavicular osteoarthritis. Electronically Signed   By: Inge Rise M.D.   On: 06/08/2020 13:15    Recent Labs: Lab Results  Component Value Date   WBC 8.6 08/20/2019   HGB 13.2 08/20/2019   PLT 179 08/20/2019   NA 137 01/18/2020   K 3.4 (L) 01/18/2020   CL 93 (L) 01/18/2020   CO2 23 01/18/2020   GLUCOSE 177 (H) 01/18/2020   BUN 17 01/18/2020   CREATININE 1.21 01/18/2020   BILITOT 0.3 01/18/2020   ALKPHOS 95 01/18/2020   AST 41 (H) 01/18/2020   ALT 37 01/18/2020   PROT 6.7 01/18/2020   ALBUMIN 4.7 01/18/2020   CALCIUM 9.5 01/18/2020   GFRAA 80 01/18/2020   QFTBGOLDPLUS NEGATIVE 12/25/2018    Speciality Comments: No specialty comments available.  Procedures:  Large Joint Inj: L greater trochanter on 06/20/2020 10:09 AM Indications: pain Details: 27 G 1.5 in needle, lateral approach  Arthrogram: No  Medications: 40 mg triamcinolone acetonide 40 MG/ML; 1.5 mL lidocaine 1 % Aspirate: 0 mL Outcome: tolerated well, no immediate complications Procedure, treatment alternatives, risks and benefits explained, specific risks discussed. Consent was given by the patient. Immediately  prior to procedure a time out was called to verify the correct patient, procedure, equipment, support staff and site/side marked as required. Patient was prepped and draped in the usual sterile fashion.     Allergies: Patient has no known allergies.   Assessment / Plan:     Visit Diagnoses: Rheumatoid arthritis involving multiple sites with positive rheumatoid factor (HCC)-positive rheumatoid factor 73.4, anti-CCP negative.  Patient had no synovitis on my examination today.  He has been off Plaquenil for 5 months now.  We will monitor for right now.  I have advised him to contact me in case he develops any increased swelling.  High risk medication use -(Plaquenil 200 mg 1 tablet twice daily started in October 2020-discontinued 5 months ago.)    Trochanteric bursitis of both hips-he sits at his job for  long hours.  He has been having severe pain and discomfort over bilateral trochanteric bursa especially the left side.  Different treatment options and their side effects were discussed.  He requested a cortisone injection.  Indications side effects contraindications of cortisone injection including elevation of blood sugar and blood pressure was discussed.  He states his blood sugar has been staying in 100-1 20 range.  He will monitor blood sugar closely after the injection.  After informed consent was obtained left trochanteric bursa was injected with cortisone as described above.  IT band stretching exercises were given.  I also offered physical therapy but he declined due to his busy schedule.  Some of the exercises were demonstrated in the office.  A stand-up desk was advised.  Patient will arrange at his work.  Below top was also advised over the mattress.  Chondromalacia patellae, right knee-currently not symptomatic.  Tendinopathy of left rotator cuff  Right shoulder injury-he had injury to his right shoulder joint.  He had MRI which showed supraspinatus and infra spinatus tendinopathy without  a tear.  He is not going for physical therapy.  He states the pain is bearable.  Spondylosis of lumbar region without myelopathy or radiculopathy  Essential hypertension-his blood pressure was elevated in the office today.  He states his blood pressure has been staying normal at home.  Has been advised to monitor blood pressure closely.  Type 2 diabetes mellitus with other specified complication, with long-term current use of insulin (HCC)-he has been advised to monitor blood sugar closely especially after the cortisone injection.  Obstructive sleep apnea-he has noticed improvement with the weight loss.  Bilateral hearing loss, unspecified hearing loss type  BMI 32.0-32.9,adult-improvement in BMI was noted.  He quit drinking alcohol 5 months ago.  Which is helped with the weight loss.  Orders: Orders Placed This Encounter  Procedures  . Large Joint Inj   No orders of the defined types were placed in this encounter.    Follow-Up Instructions: Return in about 6 months (around 12/18/2020) for Rheumatoid arthritis.   Bo Merino, MD  Note - This record has been created using Editor, commissioning.  Chart creation errors have been sought, but may not always  have been located. Such creation errors do not reflect on  the standard of medical care.

## 2020-06-09 NOTE — Telephone Encounter (Signed)
Last rx written 04/11/20. Last OV 02/01/20. Next OV has not been scheduled. UDS 02/10/18.

## 2020-06-20 ENCOUNTER — Other Ambulatory Visit: Payer: Self-pay

## 2020-06-20 ENCOUNTER — Encounter: Payer: Self-pay | Admitting: Rheumatology

## 2020-06-20 ENCOUNTER — Ambulatory Visit (INDEPENDENT_AMBULATORY_CARE_PROVIDER_SITE_OTHER): Payer: 59 | Admitting: Rheumatology

## 2020-06-20 VITALS — BP 152/88 | HR 76 | Resp 17 | Ht 72.0 in | Wt 241.0 lb

## 2020-06-20 DIAGNOSIS — M7062 Trochanteric bursitis, left hip: Secondary | ICD-10-CM

## 2020-06-20 DIAGNOSIS — M2241 Chondromalacia patellae, right knee: Secondary | ICD-10-CM | POA: Diagnosis not present

## 2020-06-20 DIAGNOSIS — G4733 Obstructive sleep apnea (adult) (pediatric): Secondary | ICD-10-CM | POA: Diagnosis not present

## 2020-06-20 DIAGNOSIS — S4991XS Unspecified injury of right shoulder and upper arm, sequela: Secondary | ICD-10-CM

## 2020-06-20 DIAGNOSIS — Z79899 Other long term (current) drug therapy: Secondary | ICD-10-CM

## 2020-06-20 DIAGNOSIS — M47816 Spondylosis without myelopathy or radiculopathy, lumbar region: Secondary | ICD-10-CM | POA: Diagnosis not present

## 2020-06-20 DIAGNOSIS — M67912 Unspecified disorder of synovium and tendon, left shoulder: Secondary | ICD-10-CM | POA: Diagnosis not present

## 2020-06-20 DIAGNOSIS — H9193 Unspecified hearing loss, bilateral: Secondary | ICD-10-CM

## 2020-06-20 DIAGNOSIS — I10 Essential (primary) hypertension: Secondary | ICD-10-CM | POA: Diagnosis not present

## 2020-06-20 DIAGNOSIS — E1169 Type 2 diabetes mellitus with other specified complication: Secondary | ICD-10-CM

## 2020-06-20 DIAGNOSIS — M0579 Rheumatoid arthritis with rheumatoid factor of multiple sites without organ or systems involvement: Secondary | ICD-10-CM | POA: Diagnosis not present

## 2020-06-20 DIAGNOSIS — M7061 Trochanteric bursitis, right hip: Secondary | ICD-10-CM | POA: Diagnosis not present

## 2020-06-20 DIAGNOSIS — Z794 Long term (current) use of insulin: Secondary | ICD-10-CM

## 2020-06-20 DIAGNOSIS — Z6832 Body mass index (BMI) 32.0-32.9, adult: Secondary | ICD-10-CM

## 2020-06-20 MED ORDER — TRIAMCINOLONE ACETONIDE 40 MG/ML IJ SUSP
40.0000 mg | INTRAMUSCULAR | Status: AC | PRN
Start: 2020-06-20 — End: 2020-06-20
  Administered 2020-06-20: 40 mg via INTRA_ARTICULAR

## 2020-06-20 MED ORDER — LIDOCAINE HCL 1 % IJ SOLN
1.5000 mL | INTRAMUSCULAR | Status: AC | PRN
  Administered 2020-06-20: 1.5 mL

## 2020-06-20 NOTE — Patient Instructions (Signed)
Iliotibial Band Syndrome Rehab Ask your health care provider which exercises are safe for you. Do exercises exactly as told by your health care provider and adjust them as directed. It is normal to feel mild stretching, pulling, tightness, or discomfort as you do these exercises. Stop right away if you feel sudden pain or your pain gets significantly worse. Do not begin these exercises until told by your health care provider. Stretching and range-of-motion exercises These exercises warm up your muscles and joints and improve the movement and flexibility of your hip and pelvis. Quadriceps stretch, prone 1. Lie on your abdomen (prone position) on a firm surface, such as a bed or padded floor. 2. Bend your left / right knee and reach back to hold your ankle or pant leg. If you cannot reach your ankle or pant leg, loop a belt around your foot and grab the belt instead. 3. Gently pull your heel toward your buttocks. Your knee should not slide out to the side. You should feel a stretch in the front of your thigh and knee (quadriceps). 4. Hold this position for __________ seconds. Repeat __________ times. Complete this exercise __________ times a day.   Iliotibial band stretch An iliotibial band is a strong band of muscle tissue that runs from the outer side of your hip to the outer side of your thigh and knee. 1. Lie on your side with your left / right leg in the top position. 2. Bend both of your knees and grab your left / right ankle. Stretch out your bottom arm to help you balance. 3. Slowly bring your top knee back so your thigh goes behind your trunk. 4. Slowly lower your top leg toward the floor until you feel a gentle stretch on the outside of your left / right hip and thigh. If you do not feel a stretch and your knee will not fall farther, place the heel of your other foot on top of your knee and pull your knee down toward the floor with your foot. 5. Hold this position for __________  seconds. Repeat __________ times. Complete this exercise __________ times a day.   Strengthening exercises These exercises build strength and endurance in your hip and pelvis. Endurance is the ability to use your muscles for a long time, even after they get tired. Straight leg raises, side-lying This exercise strengthens the muscles that rotate the leg at the hip and move it away from your body (hip abductors). 1. Lie on your side with your left / right leg in the top position. Lie so your head, shoulder, hip, and knee line up. You may bend your bottom knee to help you balance. 2. Roll your hips slightly forward so your hips are stacked directly over each other and your left / right knee is facing forward. 3. Tense the muscles in your outer thigh and lift your top leg 4-6 inches (10-15 cm). 4. Hold this position for __________ seconds. 5. Slowly lower your leg to return to the starting position. Let your muscles relax completely before doing another repetition. Repeat __________ times. Complete this exercise __________ times a day.   Leg raises, prone This exercise strengthens the muscles that move the hips backward (hip extensors). 1. Lie on your abdomen (prone position) on your bed or a firm surface. You can put a pillow under your hips if that is more comfortable for your lower back. 2. Bend your left / right knee so your foot is straight up in the air.   3. Squeeze your buttocks muscles and lift your left / right thigh off the bed. Do not let your back arch. 4. Tense your thigh muscle as hard as you can without increasing any knee pain. 5. Hold this position for __________ seconds. 6. Slowly lower your leg to return to the starting position and allow it to relax completely. Repeat __________ times. Complete this exercise __________ times a day. Hip hike 1. Stand sideways on a bottom step. Stand on your left / right leg with your other foot unsupported next to the step. You can hold on to a  railing or wall for balance if needed. 2. Keep your knees straight and your torso square. Then lift your left / right hip up toward the ceiling. 3. Slowly let your left / right hip lower toward the floor, past the starting position. Your foot should get closer to the floor. Do not lean or bend your knees. Repeat __________ times. Complete this exercise __________ times a day. This information is not intended to replace advice given to you by your health care provider. Make sure you discuss any questions you have with your health care provider. Document Revised: 06/17/2019 Document Reviewed: 06/17/2019 Elsevier Patient Education  2021 Elsevier Inc.  

## 2020-06-20 NOTE — Addendum Note (Signed)
Addended by: Bo Merino on: 06/20/2020 10:17 AM   Modules accepted: Level of Service

## 2020-07-03 MED FILL — TADALAFIL 20 MG TABS: 20 | 30 days supply | Qty: 6 | Fill #2

## 2020-07-05 ENCOUNTER — Other Ambulatory Visit: Payer: Self-pay | Admitting: Student in an Organized Health Care Education/Training Program

## 2020-07-05 ENCOUNTER — Other Ambulatory Visit: Payer: Self-pay | Admitting: *Deleted

## 2020-07-05 DIAGNOSIS — M47816 Spondylosis without myelopathy or radiculopathy, lumbar region: Secondary | ICD-10-CM

## 2020-07-05 MED ORDER — HYDROCODONE-ACETAMINOPHEN 7.5-325 MG PO TABS: ORAL_TABLET | ORAL | 0 refills | Status: DC

## 2020-07-05 NOTE — Telephone Encounter (Signed)
Last rx written 06/09/20. Last OV  02/01/20. Next OV 07/11/20. UDS  02/10/18.

## 2020-07-06 ENCOUNTER — Other Ambulatory Visit: Payer: Self-pay | Admitting: Student in an Organized Health Care Education/Training Program

## 2020-07-11 ENCOUNTER — Other Ambulatory Visit: Payer: Self-pay | Admitting: Student in an Organized Health Care Education/Training Program

## 2020-07-11 ENCOUNTER — Ambulatory Visit (INDEPENDENT_AMBULATORY_CARE_PROVIDER_SITE_OTHER): Payer: 59 | Admitting: Student in an Organized Health Care Education/Training Program

## 2020-07-11 ENCOUNTER — Encounter: Payer: Self-pay | Admitting: Student in an Organized Health Care Education/Training Program

## 2020-07-11 VITALS — BP 141/86 | HR 71 | Wt 237.5 lb

## 2020-07-11 DIAGNOSIS — Z794 Long term (current) use of insulin: Secondary | ICD-10-CM | POA: Diagnosis not present

## 2020-07-11 DIAGNOSIS — E1169 Type 2 diabetes mellitus with other specified complication: Secondary | ICD-10-CM | POA: Diagnosis not present

## 2020-07-11 DIAGNOSIS — I10 Essential (primary) hypertension: Secondary | ICD-10-CM

## 2020-07-11 DIAGNOSIS — Z23 Encounter for immunization: Secondary | ICD-10-CM

## 2020-07-11 DIAGNOSIS — Z0000 Encounter for general adult medical examination without abnormal findings: Secondary | ICD-10-CM

## 2020-07-11 LAB — POCT GLYCOSYLATED HEMOGLOBIN (HGB A1C): Hemoglobin A1C: 11.5 % — AB (ref 4.0–5.6)

## 2020-07-11 LAB — GLUCOSE, CAPILLARY: Glucose-Capillary: 302 mg/dL — ABNORMAL HIGH (ref 70–99)

## 2020-07-11 MED ORDER — CANAGLIFLOZIN 100 MG PO TABS: 100.0000 mg | ORAL_TABLET | Freq: Every day | ORAL | 3 refills | Status: DC

## 2020-07-11 NOTE — Progress Notes (Signed)
   Assessment and Plan:  See Encounters tab for problem-based medical decision making.   __________________________________________________________  HPI:   50 year old person here for follow-up of diabetes, hypertension, and obesity.  Reports doing very well since last time I saw him, no acute complaints.  He has had a series of injections to the right shoulder over the last 3 months because of tendinitis.  Shoulder pain is much improved today, not function limiting.  Reports good adherence with his medications without adverse side effect.  Reports some dietary indiscretion recently, having more dessert and other sweetened foods in the evening, feels like it is a partial replacement for alcohol which he has cut out of his life in recent months.  No recent fevers or chills.  No acute illness, no hospitalizations or ED visits.  He had a nail gun injury a few weeks back and is asking about his tetanus vaccination status.  __________________________________________________________  Problem List: Patient Active Problem List   Diagnosis Date Noted  . Rheumatoid arthritis ([place]) 11/10/2018    Priority: High  . Obesity, Class III, BMI 40-49.9 (morbid obesity) ([place]) 11/12/2016    Priority: High  . Type 2 diabetes mellitus with other specified complication ([place]) 62/06/5595    Priority: High  . Alcohol use disorder, in early remission ([place]) 01/18/2020    Priority: Medium  . Hearing loss 11/12/2016    Priority: Medium  . Osteoarthritis of lumbar spine 04/09/2016    Priority: Medium  . Tendinopathy of left rotator cuff 12/30/2015    Priority: Medium  . Obstructive sleep apnea 02/28/2015    Priority: Medium  . Essential hypertension 01/06/2015    Priority: Medium  . Erectile dysfunction 01/06/2015    Priority: Low  . Healthcare maintenance 01/06/2015    Priority: Low  . AC (acromioclavicular) arthritis 06/09/2020  . Tendinitis of right shoulder 04/26/2020  . Acromioclavicular sprain, right,  initial encounter 03/23/2020  . Nonallopathic lesion of cervical region 03/03/2020  . Nonallopathic lesion of lumbar region 03/03/2020  . Nonallopathic lesion of sacral region 03/03/2020  . Neck pain 03/03/2020    Medications: Reconciled today in Epic __________________________________________________________  Physical Exam:  Vital Signs: Vitals:   07/11/20 1047  BP: (!) 141/86  Pulse: 71  SpO2: 99%  Weight: 237 lb 8 oz (107.7 kg)    Gen: Well appearing, NAD CV: RRR, no murmurs Abd: Soft, NT, ND Ext: Warm, no edema, normal joints, no synovitis, right shoulder has good strength but some pain with abduction consistent with a supraspinatus tendinitis.

## 2020-07-11 NOTE — Assessment & Plan Note (Addendum)
Hemoglobin A1c had a dramatic increase from 6.6% to 11.5% today.  I think the reason is probably multifactorial, there have been some changes in his nutrition, he also has received several steroid injections to the right AC joints and right shoulder for tendinopathy/arthropathy which may be contributing.  We talked about some changes to his nutrition, and because his arthritis seems to be well controlled for now hopefully he can hold off on further steroid injections for the near future.  Will start Invokana 100 mg daily, previously used this for a brief period in 2018 and seemed tolerated well.  We will continue with full dose Metformin and Actos.  Check urine microalbumin today.  Addendum: I have changed Invokana to [place] based on his insurance preferred drug list.

## 2020-07-11 NOTE — Assessment & Plan Note (Signed)
Continues to have a nice decline in his weight after gastric sleeve operation in May 2019.  Weight is down from 300 pounds to 237 pounds today, BMI of 32.  His goal is to reach 220 pounds.

## 2020-07-11 NOTE — Assessment & Plan Note (Signed)
Due for colon cancer screening, has a FIT collection kit at home which she is planning to use.  We gave him a tetanus-diphtheria vaccination today.  He has a prescription for a shingles vaccine which she will get as an outpatient.  He is also going to go for a COVID-19 booster vaccine today.

## 2020-07-11 NOTE — Assessment & Plan Note (Signed)
Blood pressure is slightly above goal today.  We are adding on Invokana to his diabetes regimen which may also bring down his blood pressure some.  Plan to continue with losartan 100 mg daily and amlodipine 5 mg daily.  Check annual BMP at next visit.

## 2020-07-11 NOTE — Addendum Note (Signed)
Addended by: Marcelino Duster on: 07/11/2020 01:42 PM   Modules accepted: Orders

## 2020-07-12 ENCOUNTER — Other Ambulatory Visit (HOSPITAL_BASED_OUTPATIENT_CLINIC_OR_DEPARTMENT_OTHER): Payer: Self-pay

## 2020-07-12 LAB — MICROALBUMIN / CREATININE URINE RATIO
Creatinine, Urine: 29.1 mg/dL
Microalb/Creat Ratio: <10 mg/g creat (ref 0–29)
Microalbumin, Urine: <3 ug/mL

## 2020-07-20 MED FILL — tiZANidine HCL 4 MG TABS: 4 | 30 days supply | Qty: 30 | Fill #0

## 2020-07-21 ENCOUNTER — Telehealth: Payer: Self-pay | Admitting: *Deleted

## 2020-07-21 NOTE — Telephone Encounter (Signed)
Information was called to [employer].  PA  [id number].  Awaiting determination within 24 to 72 hours.  Sharol Harness 07/21/2020 3:45 PM.

## 2020-07-25 ENCOUNTER — Other Ambulatory Visit: Payer: Self-pay

## 2020-07-25 ENCOUNTER — Other Ambulatory Visit: Payer: Self-pay | Admitting: Student in an Organized Health Care Education/Training Program

## 2020-07-25 ENCOUNTER — Telehealth: Payer: Self-pay | Admitting: Rheumatology

## 2020-07-25 ENCOUNTER — Telehealth: Payer: Self-pay

## 2020-07-25 ENCOUNTER — Other Ambulatory Visit (HOSPITAL_COMMUNITY): Payer: Self-pay

## 2020-07-25 DIAGNOSIS — E119 Type 2 diabetes mellitus without complications: Secondary | ICD-10-CM

## 2020-07-25 DIAGNOSIS — Z794 Long term (current) use of insulin: Secondary | ICD-10-CM

## 2020-07-25 MED ORDER — HYDROXYCHLOROQUINE SULFATE 200 MG PO TABS
200.0000 mg | ORAL_TABLET | Freq: Two times a day (BID) | ORAL | 0 refills | Status: DC
Start: 2020-07-25 — End: 2021-02-03
  Filled 2020-07-25: qty 180, 90d supply, fill #0

## 2020-07-25 MED ORDER — PREDNISONE 5 MG PO TABS: ORAL_TABLET | ORAL | 0 refills | Status: DC

## 2020-07-25 MED ORDER — PIOGLITAZONE HCL 45 MG PO TABS
ORAL_TABLET | Freq: Every day | ORAL | 3 refills | Status: DC
  Filled 2020-07-25: qty 90, 90d supply, fill #0
  Filled 2020-10-19: qty 90, 90d supply, fill #1
  Filled 2021-01-17: qty 90, 90d supply, fill #2

## 2020-07-25 MED ORDER — DAPAGLIFLOZIN PROPANEDIOL 5 MG PO TABS: 5.0000 mg | ORAL_TABLET | Freq: Every day | ORAL | 3 refills | Status: DC

## 2020-07-25 NOTE — Telephone Encounter (Signed)
RX for PLQ sent to [facility], I called patient.

## 2020-07-25 NOTE — Telephone Encounter (Signed)
Patient left a message stating Rx for Prednisone, and RA medication was going to be sent in for him to [place]. [facility] only had rx for Prednisone. Patient wants to know if RA medication was sent somewhere else? Please call to advise.

## 2020-07-25 NOTE — Telephone Encounter (Signed)
Pt states  canagliflozin (INVOKANA) 100 MG TABS tablet is too expensive, requesting another medicine.  Please call pt back.

## 2020-07-25 NOTE — Telephone Encounter (Signed)
Patient states is not taking any medication. Patient is having fatigue and total body pain. What can patient take?

## 2020-07-25 NOTE — Telephone Encounter (Signed)
Per chart review, patient's LOV with PCP was 07/11/20 and it is noted in an addendum that d/t price of Inokana, PCP would change this to [place]. Per med list, Anastasio Auerbach was d/c'd and there is an active RX listed for Farxiga 5mg .  Patient states he did not know this, but can pick up [place] at the pharmacy.  Dr. Evette Doffing, Please advise if this is your POC.    On 07/21/20 it looks like a PA request was received from patient's insurance for the [facility] and is in process now, so I want to double check. Thank you, Billy Turvey

## 2020-07-25 NOTE — Telephone Encounter (Signed)
Pt called and notified of Dr. Autumn Patty instructions below. SChaplin, RN,BSN

## 2020-07-25 NOTE — Telephone Encounter (Signed)
I returned patient's call.  He is having increased joint pain swelling and stiffness.  He has been also feeling very tired.  He has been off Plaquenil for 2 months.  He came off the medication as he felt that he was in remission.  He would like to have a prednisone taper and would like to restart on Plaquenil.  I will schedule an earlier office visit for him.  We will start a prednisone taper starting at 20 mg and then taper by 5 mg every 4 days.  Please send a prescription for hydroxychloroquine 200 mg p.o. twice daily at [facility].

## 2020-07-25 NOTE — Addendum Note (Signed)
Addended by: Lalla Brothers T on: 07/25/2020 10:35 AM   Modules accepted: Orders

## 2020-07-25 NOTE — Telephone Encounter (Signed)
Patient requested a return call to discuss medication.  Patient states he has pain and fatigue and current medication isn't helping.

## 2020-07-25 NOTE — Progress Notes (Signed)
Office Visit Note  Patient: Adam [name]             Date of Birth: Aug 25, 1970           MRN: [id number]             PCP: Axel Filler, MD Referring: Axel Filler,* Visit Date: 07/26/2020 Occupation: @GUAROCC @  Subjective:  Pain and swelling in multiple joints.   History of Present Illness: Adam [name] is a 50 y.o. male with a history of seropositive rheumatoid arthritis.  He was doing quite well on hydroxychloroquine.  He decided to come off Plaquenil in October 2021.  His last visit was in February 2022 and at that time he was asymptomatic.  He states for the last 1-1/2 months symptoms a started coming back with increased pain and discomfort in his shoulders, hands and hip joints.  He has had to x-ray of his cervical and lumbar spine by Dr. Tamala Julian which showed degenerative changes in the lumbar region.  He also had x-rays of the right shoulder and MRI of the right shoulder which showed acromioclavicular joint arthropathy.  He has been noticing increased pain and swelling in his hands.  He called me yesterday and he was placed on prednisone taper due to severe discomfort and we discussed resuming hydroxychloroquine.  Continues to have chronic fatigue.  Activities of Daily Living:  Patient reports morning stiffness for all day.  Patient Reports nocturnal pain.  Difficulty dressing/grooming: Denies Difficulty climbing stairs: Denies Difficulty getting out of chair: Denies Difficulty using hands for taps, buttons, cutlery, and/or writing: Denies  Review of Systems  Constitutional: Positive for fatigue.  HENT: Negative for mouth sores, mouth dryness and nose dryness.   Eyes: Negative for pain, itching and dryness.  Respiratory: Negative for shortness of breath and difficulty breathing.   Cardiovascular: Negative for chest pain and palpitations.  Gastrointestinal: Negative for blood in stool, constipation and diarrhea.  Endocrine: Negative for increased  urination.  Genitourinary: Negative for difficulty urinating.  Musculoskeletal: Positive for arthralgias, joint pain, myalgias, morning stiffness and myalgias. Negative for joint swelling and muscle tenderness.  Skin: Negative for color change, rash and redness.  Allergic/Immunologic: Negative for susceptible to infections.  Neurological: Negative for dizziness, numbness, headaches, memory loss and weakness.  Hematological: Negative for bruising/bleeding tendency.  Psychiatric/Behavioral: Negative for confusion.    PMFS History:  Patient Active Problem List   Diagnosis Date Noted  . AC (acromioclavicular) arthritis 06/09/2020  . Tendinitis of right shoulder 04/26/2020  . Acromioclavicular sprain, right, initial encounter 03/23/2020  . Nonallopathic lesion of cervical region 03/03/2020  . Nonallopathic lesion of lumbar region 03/03/2020  . Nonallopathic lesion of sacral region 03/03/2020  . Neck pain 03/03/2020  . Alcohol use disorder, in early remission ([place]) 01/18/2020  . Rheumatoid arthritis ([place]) 11/10/2018  . Hearing loss 11/12/2016  . Obesity, Class III, BMI 40-49.9 (morbid obesity) ([place]) 11/12/2016  . Osteoarthritis of lumbar spine 04/09/2016  . Tendinopathy of left rotator cuff 12/30/2015  . Obstructive sleep apnea 02/28/2015  . Type 2 diabetes mellitus with other specified complication ([place]) 29/92/4268  . Erectile dysfunction 01/06/2015  . Healthcare maintenance 01/06/2015  . Essential hypertension 01/06/2015    Past Medical History:  Diagnosis Date  . Anxiety   . Arthritis    lower back, hands  . Deviated septum    nasal turbinate hypertrophy  . Hypertension    states under control with meds., has been on medication since age 21  . Insulin dependent  diabetes mellitus    type 2 DM  . Morbid obesity ([place])   . Sleep apnea    uses CPAP "most of the time", per pt.    Family History  Problem Relation Age of Onset  . Hyperlipidemia Mother   . Aneurysm Mother   .  Hypertension Mother   . Diabetes Father   . Hyperlipidemia Father   . Hypertension Father   . Healthy Daughter   . Healthy Daughter    Past Surgical History:  Procedure Laterality Date  . CARPAL TUNNEL RELEASE Right 07/2014  . LAPAROSCOPIC GASTRIC SLEEVE RESECTION     Dr. Excell Seltzer 09-09-17  . LAPAROSCOPIC GASTRIC SLEEVE RESECTION N/A 09/09/2017   Procedure: LAPAROSCOPIC GASTRIC SLEEVE RESECTION  AND ERAS PATHWAY;  Surgeon: Excell Seltzer, MD;  Location: [facility];  Service: General;  Laterality: N/A;  . LUMBAR FUSION    . NASAL SEPTOPLASTY W/ TURBINOPLASTY Bilateral 03/22/2017   Procedure: NASAL SEPTOPLASTY WITH  BILATERAL INFERIOR   TURBINATE REDUCTION;  Surgeon: Jerrell Belfast, MD;  Location: [facility];  Service: ENT;  Laterality: Bilateral;  . SHOULDER ARTHROSCOPY Left    x 2  . TONSILLECTOMY AND ADENOIDECTOMY    . TYMPANOSTOMY TUBE PLACEMENT Bilateral   . WISDOM TOOTH EXTRACTION     Social History   Social History Narrative  . Not on file   Immunization History  Administered Date(s) Administered  . Influenza Inj Mdck Quad Pf 01/10/2018  . Influenza,inj,Quad PF,6+ Mos 01/06/2015, 01/07/2017, 01/22/2019, 02/01/2020  . Influenza-Unspecified 03/10/2016  . PFIZER(Purple Top)SARS-COV-2 Vaccination 07/04/2019, 07/28/2019, 07/05/2020  . Pneumococcal Polysaccharide-23 01/06/2015  . Td 07/11/2020  . Tdap 12/09/2010     Objective: Vital Signs: BP 136/84 (BP Location: Left Arm, Patient Position: Sitting, Cuff Size: Normal)   Pulse 79   Resp 16   Ht 6' (1.829 m)   Wt 235 lb 12.8 oz (107 kg)   BMI 31.98 kg/m    Physical Exam Vitals and nursing note reviewed.  Constitutional:      Appearance: He is well-developed.  HENT:     Head: Normocephalic and atraumatic.  Eyes:     Conjunctiva/sclera: Conjunctivae normal.     Pupils: Pupils are equal, round, and reactive to light.  Cardiovascular:     Rate and Rhythm: Normal rate and regular rhythm.     Heart sounds: Normal heart  sounds.  Pulmonary:     Effort: Pulmonary effort is normal.     Breath sounds: Normal breath sounds.  Abdominal:     General: Bowel sounds are normal.     Palpations: Abdomen is soft.  Musculoskeletal:     Cervical back: Normal range of motion and neck supple.  Skin:    General: Skin is warm and dry.     Capillary Refill: Capillary refill takes less than 2 seconds.  Neurological:     Mental Status: He is alert and oriented to person, place, and time.  Psychiatric:        Behavior: Behavior normal.      Musculoskeletal Exam: C-spine was in good range of motion.  He had discomfort range of motion of his lumbar spine.  He discomfort with raising his right arm.  Shoulder joints and elbow joints with good range of motion.  He has some synovitis over MCPs as described below.  Hip joints were in good range of motion with some tenderness over trochanteric area.  Knee joints, ankles, MTPs with good range of motion with no synovitis.  CDAI Exam: CDAI Score:  6.3  Patient Global: 8 mm; Provider Global: 5 mm Swollen: 2 ; Tender: 4  Joint Exam 07/26/2020      Right  Left  Acromioclavicular   Tender     MCP 2   Tender  Swollen Tender  MCP 3  Swollen Tender        Investigation: No additional findings.  Imaging: No results found.  Recent Labs: Lab Results  Component Value Date   WBC 8.6 08/20/2019   HGB 13.2 08/20/2019   PLT 179 08/20/2019   NA 137 01/18/2020   K 3.4 (L) 01/18/2020   CL 93 (L) 01/18/2020   CO2 23 01/18/2020   GLUCOSE 177 (H) 01/18/2020   BUN 17 01/18/2020   CREATININE 1.21 01/18/2020   BILITOT 0.3 01/18/2020   ALKPHOS 95 01/18/2020   AST 41 (H) 01/18/2020   ALT 37 01/18/2020   PROT 6.7 01/18/2020   ALBUMIN 4.7 01/18/2020   CALCIUM 9.5 01/18/2020   GFRAA 80 01/18/2020   QFTBGOLDPLUS NEGATIVE 12/25/2018    Speciality Comments: No specialty comments available.  Procedures:  No procedures performed Allergies: Patient has no known allergies.    Assessment / Plan:     Visit Diagnoses: Rheumatoid arthritis involving multiple sites with positive rheumatoid factor (HCC) - positive rheumatoid factor 73.4, anti-CCP negative.  He stopped hydroxychloroquine in October 2021.  He started having a flare about 6 weeks ago.  He called yesterday with severe pain and swelling.  He has taken 2 doses of prednisone since yesterday and has felt better.  He still had some synovitis over MCP joints.  He described his rheumatoid arthritis on the scale of 0-10 about 8.  He will be starting hydroxychloroquine at 200 mg p.o. twice daily as prescribed earlier.- Plan: Sedimentation rate  High risk medication use - Plaquenil 200 mg 1 tablet twice daily, prednisone 20 mg p.o. daily which he will be tapering by 5 mg every 4 days..  - Plan: CBC with Differential/Platelet, COMPLETE METABOLIC PANEL WITH GFR today and then in 3 months.  Trochanteric bursitis of both hips-IT band stretches were discussed.  He had response to the cortisone injections in the past.  Chondromalacia patellae, right knee-he has chronic discomfort.  Tendinopathy of left rotator cuff-he has been having discomfort in his left shoulder.  Arthritis of right acromioclavicular joint - MRI June 08, 2020 which showed supraspinatus and infra spinatus tendinopathy and acromioclavicular arthritis.  DDD (degenerative disc disease), lumbar - s/p L5-S1 fusion.  Core strengthening exercises were emphasized.  A handout on core strength exercises was given.  Vitamin D deficiency -he has been taking vitamin D over-the-counter.  He has been experiencing increased fatigue.  I will check vitamin D levels today.  Plan: VITAMIN D 25 Hydroxy (Vit-D Deficiency, Fractures)  Other fatigue - Plan: VITAMIN D 25 Hydroxy (Vit-D Deficiency, Fractures)  Obstructive sleep apnea  Essential hypertension-his blood pressure is normal.  Type 2 diabetes mellitus with other specified complication, with long-term current use  of insulin (HCC)  Bilateral hearing loss, unspecified hearing loss type  BMI 32.0-32.9,adult - He quit drinking alcohol 5 months ago.  Which is helped with the weight loss.    Orders: Orders Placed This Encounter  Procedures  . CBC with Differential/Platelet  . COMPLETE METABOLIC PANEL WITH GFR  . Sedimentation rate  . VITAMIN D 25 Hydroxy (Vit-D Deficiency, Fractures)   No orders of the defined types were placed in this encounter.     Follow-Up Instructions: Return in about 3 months (  around 10/25/2020) for Rheumatoid arthritis, Osteoarthritis.   Bo Merino, MD  Note - This record has been created using Editor, commissioning.  Chart creation errors have been sought, but may not always  have been located. Such creation errors do not reflect on  the standard of medical care.

## 2020-07-25 NOTE — Telephone Encounter (Signed)
Yes, Adam [name] is fine as it is preferred by his insurance's formulary.

## 2020-07-26 ENCOUNTER — Encounter: Payer: Self-pay | Admitting: Rheumatology

## 2020-07-26 ENCOUNTER — Other Ambulatory Visit: Payer: Self-pay

## 2020-07-26 ENCOUNTER — Ambulatory Visit (INDEPENDENT_AMBULATORY_CARE_PROVIDER_SITE_OTHER): Payer: 59 | Admitting: Rheumatology

## 2020-07-26 VITALS — BP 136/84 | HR 79 | Resp 16 | Ht 72.0 in | Wt 235.8 lb

## 2020-07-26 DIAGNOSIS — M2241 Chondromalacia patellae, right knee: Secondary | ICD-10-CM

## 2020-07-26 DIAGNOSIS — M19011 Primary osteoarthritis, right shoulder: Secondary | ICD-10-CM

## 2020-07-26 DIAGNOSIS — I10 Essential (primary) hypertension: Secondary | ICD-10-CM | POA: Diagnosis not present

## 2020-07-26 DIAGNOSIS — Z6832 Body mass index (BMI) 32.0-32.9, adult: Secondary | ICD-10-CM

## 2020-07-26 DIAGNOSIS — E559 Vitamin D deficiency, unspecified: Secondary | ICD-10-CM

## 2020-07-26 DIAGNOSIS — M5136 Other intervertebral disc degeneration, lumbar region: Secondary | ICD-10-CM | POA: Diagnosis not present

## 2020-07-26 DIAGNOSIS — M7062 Trochanteric bursitis, left hip: Secondary | ICD-10-CM

## 2020-07-26 DIAGNOSIS — M67912 Unspecified disorder of synovium and tendon, left shoulder: Secondary | ICD-10-CM | POA: Diagnosis not present

## 2020-07-26 DIAGNOSIS — E1169 Type 2 diabetes mellitus with other specified complication: Secondary | ICD-10-CM

## 2020-07-26 DIAGNOSIS — S4991XS Unspecified injury of right shoulder and upper arm, sequela: Secondary | ICD-10-CM

## 2020-07-26 DIAGNOSIS — G4733 Obstructive sleep apnea (adult) (pediatric): Secondary | ICD-10-CM | POA: Diagnosis not present

## 2020-07-26 DIAGNOSIS — M0579 Rheumatoid arthritis with rheumatoid factor of multiple sites without organ or systems involvement: Secondary | ICD-10-CM

## 2020-07-26 DIAGNOSIS — R5383 Other fatigue: Secondary | ICD-10-CM

## 2020-07-26 DIAGNOSIS — M7061 Trochanteric bursitis, right hip: Secondary | ICD-10-CM | POA: Diagnosis not present

## 2020-07-26 DIAGNOSIS — M47816 Spondylosis without myelopathy or radiculopathy, lumbar region: Secondary | ICD-10-CM

## 2020-07-26 DIAGNOSIS — M51369 Other intervertebral disc degeneration, lumbar region without mention of lumbar back pain or lower extremity pain: Secondary | ICD-10-CM

## 2020-07-26 DIAGNOSIS — Z794 Long term (current) use of insulin: Secondary | ICD-10-CM

## 2020-07-26 DIAGNOSIS — H9193 Unspecified hearing loss, bilateral: Secondary | ICD-10-CM

## 2020-07-26 DIAGNOSIS — Z79899 Other long term (current) drug therapy: Secondary | ICD-10-CM

## 2020-07-26 NOTE — Patient Instructions (Signed)
Standing Labs We placed an order today for your standing lab work.   Please have your standing labs drawn in July and then every 5 months  If possible, please have your labs drawn 2 weeks prior to your appointment so that the provider can discuss your results at your appointment.  We have open lab daily Monday through Thursday from 1:30-4:30 PM and Friday from 1:30-4:00 PM at the office of Dr. Bo Merino, [facility].   Please be advised, all patients with office appointments requiring lab work will take precedents over walk-in lab work.  If possible, please come for your lab work on Monday and Friday afternoons, as you may experience shorter wait times. The office is located at [address], [place], [place], [place] [address] No appointment is necessary.   Labs are drawn by [facility]. Please bring your co-pay at the time of your lab draw.  You may receive a bill from [facility] for your lab work.  If you wish to have your labs drawn at another location, please call the office 24 hours in advance to send orders.  If you have any questions regarding directions or hours of operation,  please call [phone].   As a reminder, please drink plenty of water prior to coming for your lab work. Thanks!  Back Exercises The following exercises strengthen the muscles that help to support the trunk and back. They also help to keep the lower back flexible. Doing these exercises can help to prevent back pain or lessen existing pain.  If you have back pain or discomfort, try doing these exercises 2-3 times each day or as told by your health care provider.  As your pain improves, do them once each day, but increase the number of times that you repeat the steps for each exercise (do more repetitions).  To prevent the recurrence of back pain, continue to do these exercises once each day or as told by your health care provider. Do exercises exactly as told by your health care provider and  adjust them as directed. It is normal to feel mild stretching, pulling, tightness, or discomfort as you do these exercises, but you should stop right away if you feel sudden pain or your pain gets worse. Exercises Single knee to chest Repeat these steps 3-5 times for each leg: 1. Lie on your back on a firm bed or the floor with your legs extended. 2. Bring one knee to your chest. Your other leg should stay extended and in contact with the floor. 3. Hold your knee in place by grabbing your knee or thigh with both hands and hold. 4. Pull on your knee until you feel a gentle stretch in your lower back or buttocks. 5. Hold the stretch for 10-30 seconds. 6. Slowly release and straighten your leg. Pelvic tilt Repeat these steps 5-10 times: 1. Lie on your back on a firm bed or the floor with your legs extended. 2. Bend your knees so they are pointing toward the ceiling and your feet are flat on the floor. 3. Tighten your lower abdominal muscles to press your lower back against the floor. This motion will tilt your pelvis so your tailbone points up toward the ceiling instead of pointing to your feet or the floor. 4. With gentle tension and even breathing, hold this position for 5-10 seconds. Cat-cow Repeat these steps until your lower back becomes more flexible: 1. Get into a hands-and-knees position on a firm surface. Keep your hands under your  shoulders, and keep your knees under your hips. You may place padding under your knees for comfort. 2. Let your head hang down toward your chest. Contract your abdominal muscles and point your tailbone toward the floor so your lower back becomes rounded like the back of a cat. 3. Hold this position for 5 seconds. 4. Slowly lift your head, let your abdominal muscles relax and point your tailbone up toward the ceiling so your back forms a sagging arch like the back of a cow. 5. Hold this position for 5 seconds.   Press-ups Repeat these steps 5-10 times: 1. Lie  on your abdomen (face-down) on the floor. 2. Place your palms near your head, about shoulder-width apart. 3. Keeping your back as relaxed as possible and keeping your hips on the floor, slowly straighten your arms to raise the top half of your body and lift your shoulders. Do not use your back muscles to raise your upper torso. You may adjust the placement of your hands to make yourself more comfortable. 4. Hold this position for 5 seconds while you keep your back relaxed. 5. Slowly return to lying flat on the floor.   [place]ridges Repeat these steps 10 times: 1. Lie on your back on a firm surface. 2. Bend your knees so they are pointing toward the ceiling and your feet are flat on the floor. Your arms should be flat at your sides, next to your body. 3. Tighten your buttocks muscles and lift your buttocks off the floor until your waist is at almost the same height as your knees. You should feel the muscles working in your buttocks and the back of your thighs. If you do not feel these muscles, slide your feet 1-2 inches farther away from your buttocks. 4. Hold this position for 3-5 seconds. 5. Slowly lower your hips to the starting position, and allow your buttocks muscles to relax completely. If this exercise is too easy, try doing it with your arms crossed over your chest.   Abdominal crunches Repeat these steps 5-10 times: 1. Lie on your back on a firm bed or the floor with your legs extended. 2. Bend your knees so they are pointing toward the ceiling and your feet are flat on the floor. 3. Cross your arms over your chest. 4. Tip your chin slightly toward your chest without bending your neck. 5. Tighten your abdominal muscles and slowly raise your trunk (torso) high enough to lift your shoulder blades a tiny bit off the floor. Avoid raising your torso higher than that because it can put too much stress on your low back and does not help to strengthen your abdominal muscles. 6. Slowly return to your  starting position. Back lifts Repeat these steps 5-10 times: 1. Lie on your abdomen (face-down) with your arms at your sides, and rest your forehead on the floor. 2. Tighten the muscles in your legs and your buttocks. 3. Slowly lift your chest off the floor while you keep your hips pressed to the floor. Keep the back of your head in line with the curve in your back. Your eyes should be looking at the floor. 4. Hold this position for 3-5 seconds. 5. Slowly return to your starting position. Contact a health care provider if:  Your back pain or discomfort gets much worse when you do an exercise.  Your worsening back pain or discomfort does not lessen within 2 hours after you exercise. If you have any of these problems, stop doing these  exercises right away. Do not do them again unless your health care provider says that you can. Get help right away if:  You develop sudden, severe back pain. If this happens, stop doing the exercises right away. Do not do them again unless your health care provider says that you can. This information is not intended to replace advice given to you by your health care provider. Make sure you discuss any questions you have with your health care provider. Document Revised: 08/14/2018 Document Reviewed: 01/09/2018 Elsevier Patient Education  [place].

## 2020-07-27 LAB — COMPLETE METABOLIC PANEL WITH GFR
AG Ratio: 2.1 (calc) (ref 1.0–2.5)
ALT: 31 U/L (ref 9–46)
AST: 19 U/L (ref 10–35)
Albumin: 4.8 g/dL (ref 3.6–5.1)
Alkaline phosphatase (APISO): 72 U/L (ref 35–144)
BUN: 19 mg/dL (ref 7–25)
CO2: 26 mmol/L (ref 20–32)
Calcium: 11.3 mg/dL — ABNORMAL HIGH (ref 8.6–10.3)
Chloride: 97 mmol/L — ABNORMAL LOW (ref 98–110)
Creat: 0.97 mg/dL (ref 0.70–1.33)
GFR, Est African American: 104 mL/min/{1.73_m2} (ref >=60)
GFR, Est Non African American: 90 mL/min/{1.73_m2} (ref >=60)
Globulin: 2.3 g/dL (calc) (ref 1.9–3.7)
Glucose, Bld: 240 mg/dL — ABNORMAL HIGH (ref 65–99)
Potassium: 4.3 mmol/L (ref 3.5–5.3)
Sodium: 135 mmol/L (ref 135–146)
Total Bilirubin: 0.7 mg/dL (ref 0.2–1.2)
Total Protein: 7.1 g/dL (ref 6.1–8.1)

## 2020-07-27 LAB — CBC WITH DIFFERENTIAL/PLATELET
Absolute Monocytes: 264 cells/uL (ref 200–950)
Basophils Absolute: 18 cells/uL (ref 0–200)
Basophils Relative: 0.2 %
Eosinophils Absolute: 0 cells/uL — ABNORMAL LOW (ref 15–500)
Eosinophils Relative: 0 %
HCT: 47.6 % (ref 38.5–50.0)
Hemoglobin: 16.1 g/dL (ref 13.2–17.1)
Lymphs Abs: 937 cells/uL (ref 850–3900)
MCH: 29.9 pg (ref 27.0–33.0)
MCHC: 33.8 g/dL (ref 32.0–36.0)
MCV: 88.5 fL (ref 80.0–100.0)
MPV: 10.7 fL (ref 7.5–12.5)
Monocytes Relative: 2.9 %
Neutro Abs: 7881 cells/uL — ABNORMAL HIGH (ref 1500–7800)
Neutrophils Relative %: 86.6 %
Platelets: 255 10*3/uL (ref 140–400)
RBC: 5.38 10*6/uL (ref 4.20–5.80)
RDW: 12.8 % (ref 11.0–15.0)
Total Lymphocyte: 10.3 %
WBC: 9.1 10*3/uL (ref 3.8–10.8)

## 2020-07-27 LAB — VITAMIN D 25 HYDROXY (VIT D DEFICIENCY, FRACTURES): Vit D, 25-Hydroxy: 32 ng/mL (ref 30–100)

## 2020-07-27 LAB — SEDIMENTATION RATE: Sed Rate: 2 mm/h (ref 0–20)

## 2020-07-27 NOTE — Progress Notes (Signed)
CBC is normal, glucose is elevated at 240 calcium is elevated, sed rate is normal, vitamin D is low normal.  Please advise patient to avoid calcium supplement.  He should take vitamin D 1000 units daily.

## 2020-07-28 ENCOUNTER — Other Ambulatory Visit (HOSPITAL_COMMUNITY): Payer: Self-pay

## 2020-08-01 ENCOUNTER — Other Ambulatory Visit (HOSPITAL_COMMUNITY): Payer: Self-pay

## 2020-08-01 ENCOUNTER — Other Ambulatory Visit: Payer: Self-pay | Admitting: Student in an Organized Health Care Education/Training Program

## 2020-08-01 MED FILL — Duloxetine HCl Enteric Coated Pellets Cap 60 MG (Base Eq): ORAL | 90 days supply | Qty: 90 | Fill #0 | Status: AC

## 2020-08-01 MED FILL — Amlodipine Besylate Tab 5 MG (Base Equivalent): ORAL | 90 days supply | Qty: 90 | Fill #0 | Status: AC

## 2020-08-01 MED FILL — Hydrochlorothiazide Tab 25 MG: ORAL | 90 days supply | Qty: 90 | Fill #0 | Status: AC

## 2020-08-03 ENCOUNTER — Other Ambulatory Visit (HOSPITAL_COMMUNITY): Payer: Self-pay

## 2020-08-03 ENCOUNTER — Other Ambulatory Visit: Payer: Self-pay | Admitting: Student in an Organized Health Care Education/Training Program

## 2020-08-03 DIAGNOSIS — M47816 Spondylosis without myelopathy or radiculopathy, lumbar region: Secondary | ICD-10-CM

## 2020-08-03 MED ORDER — HYDROCODONE-ACETAMINOPHEN 7.5-325 MG PO TABS
ORAL_TABLET | ORAL | 0 refills | Status: DC
Start: 2020-08-03 — End: 2020-08-29
  Filled 2020-08-03: qty 45, 30d supply, fill #0

## 2020-08-03 MED ORDER — LOSARTAN POTASSIUM 100 MG PO TABS
ORAL_TABLET | Freq: Every day | ORAL | 3 refills | Status: DC
  Filled 2020-08-03: qty 90, 90d supply, fill #0
  Filled 2020-10-26: qty 90, 90d supply, fill #1
  Filled 2021-01-24: qty 90, 90d supply, fill #2
  Filled 2021-04-24: qty 90, 90d supply, fill #3

## 2020-08-03 MED FILL — Metformin HCl Tab 1000 MG: ORAL | 90 days supply | Qty: 180 | Fill #0 | Status: AC

## 2020-08-03 NOTE — Telephone Encounter (Signed)
Last rx written  07/05/20. Last OV 07/11/20. Next OV 10/03/20. UDS 02/10/18.

## 2020-08-05 ENCOUNTER — Other Ambulatory Visit (HOSPITAL_COMMUNITY): Payer: Self-pay

## 2020-08-20 ENCOUNTER — Other Ambulatory Visit (HOSPITAL_COMMUNITY): Payer: Self-pay

## 2020-08-20 MED ORDER — CANAGLIFLOZIN 100 MG PO TABS
100.0000 mg | ORAL_TABLET | Freq: Every day | ORAL | 3 refills | Status: DC
  Filled 2020-08-20: qty 90, 90d supply, fill #0

## 2020-08-22 ENCOUNTER — Other Ambulatory Visit (HOSPITAL_COMMUNITY): Payer: Self-pay

## 2020-08-22 MED ORDER — DAPAGLIFLOZIN PROPANEDIOL 5 MG PO TABS
5.0000 mg | ORAL_TABLET | Freq: Every day | ORAL | 0 refills | Status: DC
  Filled 2020-08-22: qty 90, 90d supply, fill #0
  Filled 2020-11-25: qty 90, 90d supply, fill #1
  Filled 2021-02-17: qty 90, 90d supply, fill #2
  Filled 2021-05-22: qty 60, 60d supply, fill #3
  Filled 2021-05-26: qty 60, 60d supply, fill #0

## 2020-08-23 ENCOUNTER — Other Ambulatory Visit (HOSPITAL_COMMUNITY): Payer: Self-pay

## 2020-08-23 ENCOUNTER — Ambulatory Visit (INDEPENDENT_AMBULATORY_CARE_PROVIDER_SITE_OTHER): Payer: 59 | Admitting: Family Medicine

## 2020-08-23 ENCOUNTER — Other Ambulatory Visit: Payer: Self-pay

## 2020-08-23 ENCOUNTER — Ambulatory Visit: Payer: Self-pay

## 2020-08-23 ENCOUNTER — Encounter: Payer: Self-pay | Admitting: Family Medicine

## 2020-08-23 VITALS — BP 122/78 | HR 73 | Ht 72.0 in | Wt 237.0 lb

## 2020-08-23 DIAGNOSIS — M19011 Primary osteoarthritis, right shoulder: Secondary | ICD-10-CM

## 2020-08-23 DIAGNOSIS — G8929 Other chronic pain: Secondary | ICD-10-CM | POA: Diagnosis not present

## 2020-08-23 DIAGNOSIS — M25512 Pain in left shoulder: Secondary | ICD-10-CM

## 2020-08-23 MED ORDER — METHOCARBAMOL 500 MG PO TABS
500.0000 mg | ORAL_TABLET | Freq: Three times a day (TID) | ORAL | 0 refills | Status: DC | PRN
  Filled 2020-08-23: qty 30, 10d supply, fill #0

## 2020-08-23 MED ORDER — METHOCARBAMOL 500 MG PO TABS: 500.0000 mg | ORAL_TABLET | Freq: Three times a day (TID) | ORAL | 0 refills | Status: DC | PRN

## 2020-08-23 NOTE — Assessment & Plan Note (Addendum)
Repeat injection given today.  Chronic problem with exacerbation.  Patient has not responded as well to this.  We did discuss the possibility of PRP.  We also discussed the possibility of surgical intervention.  Patient will consider this.  Patient will follow up with me again in 6 to 8 weeks.  Getting a different muscle relaxer today to secondary to the discomfort and pain.  Patient knows to discontinue the Zanaflex.

## 2020-08-23 NOTE — Progress Notes (Signed)
[place] Phone: [phone] Subjective:   Adam [name], am serving as a scribe for Dr. Hulan Saas. This visit occurred during the SARS-CoV-2 public health emergency.  Safety protocols were in place, including screening questions prior to the visit, additional usage of staff PPE, and extensive cleaning of exam room while observing appropriate contact time as indicated for disinfecting solutions.   I'm seeing this patient by the request  of:  Axel Filler, MD  CC: Right shoulder exam follow-up  [id number]   06/09/2020 Patient was found to have more of a acromioclavicular arthritis on the MRI.  Discussed with patient that if worsening pain can consider potential injections.  Discussed PRP, discussed though that I would highly suggest the patient consider the possibility of formal physical therapy.  Patient is having difficulty with work and would not have time to get off at this moment but may call if this changes.  We discussed keeping hands within peripheral vision and keeping the home exercises.  Follow-up with me again if patient decides on any of the other treatment options.  Update 08/23/2020 Adam [name] is a 50 y.o. male coming in with complaint of R shoulder pain. Patient states that his pain is worse than last visit. Feels pain and movement in R clavicle as well. Takes hydrocodone and Tylenol for pain that is constant.  Patient states is waking him up at night.  Affecting daily activities.  Feels like it is worsening.     Past Medical History:  Diagnosis Date  . Anxiety   . Arthritis    lower back, hands  . Deviated septum    nasal turbinate hypertrophy  . Hypertension    states under control with meds., has been on medication since age 31  . Insulin dependent diabetes mellitus    type 2 DM  . Morbid obesity ([place])   . Sleep apnea    uses CPAP "most of the time", per pt.   Past  Surgical History:  Procedure Laterality Date  . CARPAL TUNNEL RELEASE Right 07/2014  . LAPAROSCOPIC GASTRIC SLEEVE RESECTION     Dr. Excell Seltzer 09-09-17  . LAPAROSCOPIC GASTRIC SLEEVE RESECTION N/A 09/09/2017   Procedure: LAPAROSCOPIC GASTRIC SLEEVE RESECTION  AND ERAS PATHWAY;  Surgeon: Excell Seltzer, MD;  Location: [facility];  Service: General;  Laterality: N/A;  . LUMBAR FUSION    . NASAL SEPTOPLASTY W/ TURBINOPLASTY Bilateral 03/22/2017   Procedure: NASAL SEPTOPLASTY WITH  BILATERAL INFERIOR   TURBINATE REDUCTION;  Surgeon: Jerrell Belfast, MD;  Location: [facility];  Service: ENT;  Laterality: Bilateral;  . SHOULDER ARTHROSCOPY Left    x 2  . TONSILLECTOMY AND ADENOIDECTOMY    . TYMPANOSTOMY TUBE PLACEMENT Bilateral   . WISDOM TOOTH EXTRACTION     Social History   Socioeconomic History  . Marital status: Married    Spouse name: Not on file  . Number of children: Not on file  . Years of education: Not on file  . Highest education level: Not on file  Occupational History  . Not on file  Tobacco Use  . Smoking status: Never Smoker  . Smokeless tobacco: Former Systems developer    Types: Secondary school teacher  . Vaping Use: Never used  Substance and Sexual Activity  . Alcohol use: Not Currently  . Drug use: [name]  . Sexual activity: Yes  Other Topics Concern  . Not on file  Social History Narrative  .  Not on file   Social Determinants of Health   Financial Resource Strain: Not on file  Food Insecurity: Not on file  Transportation Needs: Not on file  Physical Activity: Not on file  Stress: Not on file  Social Connections: Not on file   [name] Known Allergies Family History  Problem Relation Age of Onset  . Hyperlipidemia Mother   . Aneurysm Mother   . Hypertension Mother   . Diabetes Father   . Hyperlipidemia Father   . Hypertension Father   . Healthy Daughter   . Healthy Daughter     Current Outpatient Medications (Endocrine & Metabolic):  .  dapagliflozin propanediol (FARXIGA) 5 MG  TABS tablet, Take 1 tablet (5 mg total) by mouth daily before breakfast. .  dapagliflozin propanediol (FARXIGA) 5 MG TABS tablet, Take 1 tablet (5 mg total) by mouth daily before breakfast. .  metFORMIN (GLUCOPHAGE) 1000 MG tablet, TAKE 1 TABLET BY MOUTH TWICE DAILY WITH A MEAL. .  pioglitazone (ACTOS) 45 MG tablet, TAKE 1 TABLET BY MOUTH DAILY. Marland Kitchen  predniSONE (DELTASONE) 5 MG tablet, Take 4 tabs po x 4 days, 3  tabs po x 4 days, 2  tabs po x 4 days, 1  tab po x 4 days  Current Outpatient Medications (Cardiovascular):  .  amLODipine (NORVASC) 5 MG tablet, TAKE 1 TABLET BY MOUTH ONCE A DAY .  hydrochlorothiazide (HYDRODIURIL) 25 MG tablet, TAKE 1 TABLET BY MOUTH ONCE A DAY .  losartan (COZAAR) 100 MG tablet, TAKE 1 TABLET BY MOUTH DAILY. .  simvastatin (ZOCOR) 20 MG tablet, TAKE 1 TABLET BY MOUTH ONCE DAILY .  tadalafil (CIALIS) 20 MG tablet, TAKE 1 TABLET BY MOUTH DAILY AS NEEDED FOR ERECTILE DYSFUNCTION.  Current Outpatient Medications (Respiratory):  .  loratadine (CLARITIN) 10 MG tablet, Take 10 mg by mouth daily.  Current Outpatient Medications (Analgesics):  .  aspirin EC 81 MG tablet, Take 81 mg by mouth daily. Marland Kitchen  HYDROcodone-acetaminophen (NORCO) 7.5-325 MG tablet, TAKE 1 TABLET BY MOUTH EVERY 12 HOURS AS NEEDED FOR MODERATE PAIN. THIS IS A 30 DAY SUPPLY. .  naproxen (NAPROSYN) 500 MG tablet, Take 1 tablet (500 mg total) by mouth 2 (two) times daily as needed.   Current Outpatient Medications (Other):  Marland Kitchen  DULoxetine (CYMBALTA) 60 MG capsule, TAKE 1 CAPSULE BY MOUTH ONCE A DAY .  hydroxychloroquine (PLAQUENIL) 200 MG tablet, Take 1 tablet (200 mg total) by mouth 2 (two) times daily. .  Multiple Vitamin (MULTIVITAMIN WITH MINERALS) TABS tablet, Take 1 tablet by mouth daily. .  methocarbamol (ROBAXIN) 500 MG tablet, Take 1 tablet (500 mg total) by mouth every 8 (eight) hours as needed for muscle spasms.   Reviewed prior external information including notes and imaging from  primary  care provider As well as notes that were available from care everywhere and other healthcare systems.  Past medical history, social, surgical and family history all reviewed in electronic medical record.  [name] pertanent information unless stated regarding to the chief complaint.   Review of Systems:  [name] headache, visual changes, nausea, vomiting, diarrhea, constipation, dizziness, abdominal pain, skin rash, fevers, chills, night sweats, weight loss, swollen lymph nodes, body aches, joint swelling, chest pain, shortness of breath, mood changes. POSITIVE muscle aches  Objective  Blood pressure 122/78, pulse 73, height 6' (1.829 m), weight 237 lb (107.5 kg), SpO2 98 %.   General: [name] apparent distress alert and oriented x3 mood and affect normal, dressed appropriately.  HEENT: Pupils equal, extraocular  movements intact  Respiratory: Patient's speak in full sentences and does not appear short of breath  Cardiovascular: [name] lower extremity edema, non tender, [name] erythema  Gait normal with good balance and coordination.  MSK: Right shoulder exam shows the patient does have positive impingement noted.  Patient does have severe tenderness to palpation in the paraspinal musculature of the thoracic area. Positive impingement and positive crossover noted.  Procedure: Real-time Ultrasound Guided Injection of right acromioclavicular joint Device: GE Logiq Q7 Ultrasound guided injection is preferred based studies that show increased duration, increased effect, greater accuracy, decreased procedural pain, increased response rate, and decreased cost with ultrasound guided versus blind injection.  Verbal informed consent obtained.  Time-out conducted.  Noted [name] overlying erythema, induration, or other signs of local infection.  Skin prepped in a sterile fashion.  Local anesthesia: Topical Ethyl chloride.  With sterile technique and under real time ultrasound guidance: With a 25-gauge half inch needle injected  with 0.5 cc of 0.5% Marcaine and 0.5 cc of Kenalog 40 mg/mL. Completed without difficulty  Pain immediately resolved suggesting accurate placement of the medication.  Advised to call if fevers/chills, erythema, induration, drainage, or persistent bleeding.  Impression: Technically successful ultrasound guided injection.   Impression and Recommendations:     The above documentation has been reviewed and is accurate and complete Lyndal Pulley, DO

## 2020-08-23 NOTE — Patient Instructions (Signed)
Injected AC joint today If not better may consider PRP or surgery Stop taking zanaflex Robaxin sent into your pharmacy See me again in 6 weeks

## 2020-08-29 ENCOUNTER — Other Ambulatory Visit: Payer: Self-pay | Admitting: Student in an Organized Health Care Education/Training Program

## 2020-08-29 ENCOUNTER — Telehealth: Payer: Self-pay | Admitting: Family Medicine

## 2020-08-29 ENCOUNTER — Other Ambulatory Visit (HOSPITAL_COMMUNITY): Payer: Self-pay

## 2020-08-29 ENCOUNTER — Other Ambulatory Visit: Payer: Self-pay

## 2020-08-29 DIAGNOSIS — G8929 Other chronic pain: Secondary | ICD-10-CM

## 2020-08-29 DIAGNOSIS — M47816 Spondylosis without myelopathy or radiculopathy, lumbar region: Secondary | ICD-10-CM

## 2020-08-29 NOTE — Telephone Encounter (Signed)
Order sent. Patient notified via mychart. 

## 2020-08-29 NOTE — Telephone Encounter (Signed)
Pt would like [place] to refer him to Dr. Marlou Sa at [facility] for his R shoulder.

## 2020-08-30 ENCOUNTER — Other Ambulatory Visit (HOSPITAL_COMMUNITY): Payer: Self-pay

## 2020-08-30 MED ORDER — HYDROCODONE-ACETAMINOPHEN 7.5-325 MG PO TABS
ORAL_TABLET | ORAL | 0 refills | Status: DC
  Filled 2020-08-30 – 2020-08-31 (×2): qty 45, 30d supply, fill #0

## 2020-08-31 ENCOUNTER — Other Ambulatory Visit (HOSPITAL_COMMUNITY): Payer: Self-pay

## 2020-09-05 ENCOUNTER — Telehealth: Payer: Self-pay | Admitting: Student in an Organized Health Care Education/Training Program

## 2020-09-05 ENCOUNTER — Other Ambulatory Visit: Payer: Self-pay

## 2020-09-05 ENCOUNTER — Telehealth: Payer: Self-pay

## 2020-09-05 ENCOUNTER — Ambulatory Visit (INDEPENDENT_AMBULATORY_CARE_PROVIDER_SITE_OTHER): Payer: 59 | Admitting: Orthopedic Surgery

## 2020-09-05 DIAGNOSIS — M05741 Rheumatoid arthritis with rheumatoid factor of right hand without organ or systems involvement: Secondary | ICD-10-CM

## 2020-09-05 DIAGNOSIS — M19011 Primary osteoarthritis, right shoulder: Secondary | ICD-10-CM | POA: Diagnosis not present

## 2020-09-05 DIAGNOSIS — M47816 Spondylosis without myelopathy or radiculopathy, lumbar region: Secondary | ICD-10-CM

## 2020-09-05 NOTE — Telephone Encounter (Signed)
Sounds good to me

## 2020-09-05 NOTE — Telephone Encounter (Signed)
Pls contact pt [phone] 

## 2020-09-05 NOTE — Telephone Encounter (Signed)
Return pt's call. Pt wants to know if his doctor could order pain clinic referral in [place]. States he has been there before - [facility]. Inform pt an appt to see his doctor first may be needed; states he understands.

## 2020-09-05 NOTE — Telephone Encounter (Signed)
Pain clinic in [place] is reasonable. The patient has rheumatoid arthritis as well as lumbar osteoarthritis and uses chronic opioids to improve function.

## 2020-09-05 NOTE — Telephone Encounter (Signed)
Pt f/u with his referral request for a Pain clinic.  The Pain Clinic [facility] [place]) he is suggesting has been Closed down since Last year.  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  Pt verbally agreed to use the office to send his referral too.  Please advise.

## 2020-09-06 ENCOUNTER — Ambulatory Visit: Payer: Self-pay

## 2020-09-06 ENCOUNTER — Other Ambulatory Visit (HOSPITAL_COMMUNITY): Payer: Self-pay

## 2020-09-06 ENCOUNTER — Ambulatory Visit (INDEPENDENT_AMBULATORY_CARE_PROVIDER_SITE_OTHER): Payer: 59 | Admitting: Family Medicine

## 2020-09-06 VITALS — BP 138/90 | HR 76 | Ht 72.0 in | Wt 230.8 lb

## 2020-09-06 DIAGNOSIS — G8929 Other chronic pain: Secondary | ICD-10-CM

## 2020-09-06 DIAGNOSIS — M25511 Pain in right shoulder: Secondary | ICD-10-CM

## 2020-09-06 DIAGNOSIS — M778 Other enthesopathies, not elsewhere classified: Secondary | ICD-10-CM | POA: Diagnosis not present

## 2020-09-06 DIAGNOSIS — M62838 Other muscle spasm: Secondary | ICD-10-CM

## 2020-09-06 DIAGNOSIS — M19011 Primary osteoarthritis, right shoulder: Secondary | ICD-10-CM | POA: Diagnosis not present

## 2020-09-06 MED ORDER — TIZANIDINE HCL 4 MG PO TABS: 4.0000 mg | ORAL_TABLET | Freq: Three times a day (TID) | ORAL | 1 refills | Status: DC | PRN

## 2020-09-06 MED FILL — Tadalafil Tab 20 MG: ORAL | 30 days supply | Qty: 6 | Fill #0 | Status: AC

## 2020-09-06 NOTE — Progress Notes (Signed)
I, Wendy Poet, LAT, ATC, am serving as scribe for Dr. [name].  Hershal Eriksson is a 50 y.o. male who presents to [place] [facility] today for f/u of chronic R shoulder pain.  He was last seen by Dr. Tamala Julian on 08/23/20 noting that his pain was worsening despite taking hydrocodone and Tylenol for pain.  He had a R ACJ injection at his last visit and was advised to consider PRP or possible surgical intervention.  Pt saw Dr. Marlou Sa at [facility] on 09/05/20.  Since his last visit w/ Dr. Tamala Julian, pt reports Dr. Marlou Sa is planning shoulder surgery. Pt is hoping to get a couple trigger point injections in his mid-trap. Pt reports the muscle dysfunction is causing pain in L clavicle w/ mechanical symptoms. No neck pain. Pt c/o pain waking him up at night.  Diagnostic imaging: R shoulder MRI- 06/07/20; R shoulder XR- 03/23/20  Pertinent review of systems: No fevers or chills  Relevant historical information: Diabetes.  Rheumatoid arthritis.   Exam:  BP 138/90 (BP Location: Right Arm, Patient Position: Sitting, Cuff Size: Normal)   Pulse 76   Ht 6' (1.829 m)   Wt 230 lb 12.8 oz (104.7 kg)   SpO2 99%   BMI 31.30 kg/m  General: Well Developed, well nourished, and in no acute distress.   MSK: Right shoulder normal-appearing Tender palpation right trapezius at insertion onto superior medial scapula corner. Decreased shoulder motion to abduction.  Pain with shoulder shrug.    Lab and Radiology Results  Procedure: Real-time Ultrasound Guided Injection of right trapezius insertion superior medial scapular corner Device: Philips Affiniti 50G Images permanently stored and available for review in PACS Verbal informed consent obtained.  Discussed risks and benefits of procedure. Warned about infection bleeding damage to structures skin hypopigmentation and fat atrophy among others.  Additionally discussed risk of pneumothorax with this procedure. Patient expresses understanding  and agreement Time-out conducted.   Noted no overlying erythema, induration, or other signs of local infection.   Skin prepped in a sterile fashion.   Local anesthesia: Topical Ethyl chloride.   With sterile technique and under real time ultrasound guidance:  40 mg of Kenalog and 2 mL of Marcaine injected into tendon insertion trapezius scapular corner. Fluid seen entering the insertion site.  Completed without difficulty   Pain immediately resolved suggesting accurate placement of the medication.   Advised to call if fevers/chills, erythema, induration, drainage, or persistent bleeding.   Images permanently stored and available for review in the ultrasound unit.  Impression: Technically successful ultrasound guided injection.         Assessment and Plan: 50 y.o. male with right trapezius spasm and dysfunction.  This occurs in the setting of AC DJD and right shoulder rotator cuff tendinopathy.  Patient is scheduled to have subacromial decompression and distal clavicle excision with Dr. Marlou Sa in about 3 weeks.  He has bothersome trapezius spasm and dysfunction now and wishes to have a junction to help with his pain.  Plan for injection.  Additionally proceed with conservative management strategies including heating pad TENS unit and tizanidine.  Ideally physical therapy would be used in this situation however he does not have enough time before surgery for physical therapy to do much.  If surgery gets delayed PT would be a great option.  Patient will keep me updated.  Recheck back with myself or Dr. Tamala Julian as needed.  PDMP not reviewed this encounter. Orders Placed This Encounter  Procedures  .  [place] LIMITED JOINT SPACE STRUCTURES UP RIGHT(NO LINKED CHARGES)    Standing Status:   Future    Number of Occurrences:   1    Standing Expiration Date:   03/09/2021    Order Specific Question:   Reason for Exam (SYMPTOM  OR DIAGNOSIS REQUIRED)    Answer:   right shoulder pain    Order Specific  Question:   Preferred imaging location?    Answer:   [facility]   Meds ordered this encounter  Medications  . tiZANidine (ZANAFLEX) 4 MG tablet    Sig: Take 1 tablet (4 mg total) by mouth every 8 (eight) hours as needed for muscle spasms.    Dispense:  90 tablet    Refill:  1     Discussed warning signs or symptoms. Please see discharge instructions. Patient expresses understanding.   The above documentation has been reviewed and is accurate and complete [name], M.D.

## 2020-09-06 NOTE — Patient Instructions (Signed)
Thank you for coming in today.  Call or go to the ER if you develop a large red swollen joint with extreme pain or oozing puss.   Use heating pad and TENS unit.   Plan for surgery.   Recheck with Dr Tamala Julian or myself.   If surgery is delayed PT could help this issue a lot.

## 2020-09-07 ENCOUNTER — Encounter: Payer: Self-pay | Admitting: Orthopedic Surgery

## 2020-09-07 NOTE — Progress Notes (Signed)
Office Visit Note   Patient: Adam [name]           Date of Birth: Aug 29, 1970           MRN: [id number] Visit Date: 09/05/2020 Requested by: Adam Filler, MD [address] [place],  [place] [address] PCP: Adam Filler, MD  Subjective: Chief Complaint  Patient presents with  . Right Shoulder - Pain    HPI: Adam [name] is a 50 year old patient with right shoulder pain.  He describes popping in the clavicle region.  He is a right-sided sleeper and is hard for him to sleep on that side.  Has been taking Tylenol Norco and Motrin for the problem.  Sustained an electrocution injury in 1999 and had subsequent left shoulder surgery for that.  Patient describes having a fall in November 2021 on the right-hand side.  Describes relatively constant pain in the superior aspect of the shoulder.  MRI scan has been performed which shows intact rotator cuff intact biceps tendon and moderate arthritis in the [facility] joint.  Patient describes having had several injections which have helped particularly the one into the [facility] joint from direct superior approach.  However that relief was short-lived and his pain has recurred.              ROS: All systems reviewed are negative as they relate to the chief complaint within the history of present illness.  Patient denies  fevers or chills.   Assessment & Plan: Visit Diagnoses:  1. Arthritis of right acromioclavicular joint     Plan: Impression is symptomatic right shoulder AC joint arthritis.  Rotator cuff strength and passive range of motion are good at this time.  We discussed operative and nonoperative treatment options for this problem.  He would prefer a more definitive solution for 6 months of symptoms.  No other discrete pathology present in the right shoulder.  He did get relief from the [facility] joint injection.  Plan at this time would be arthroscopic distal clavicle excision.  The risk and benefits are discussed including not limited to  shoulder stiffness incomplete pain relief as well as a period where he may not be able to sleep on that right-hand side which would likely be about 3 to 4 weeks.  Anticipate about 1 week out of work.  He would need to who avoid physical type of lifting activities for the first 3 to 4 weeks after surgery.  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  Patient understands risk benefits and wishes to proceed.  All questions answered  Follow-Up Instructions: No follow-ups on file.   Orders:  No orders of the defined types were placed in this encounter.  No orders of the defined types were placed in this encounter.     Procedures: No procedures performed   Clinical Data: No additional findings.  Objective: Vital Signs: There were no vitals taken for this visit.  Physical Exam:   Constitutional: Patient appears well-developed HEENT:  Head: Normocephalic Eyes:EOM are normal Neck: Normal range of motion Cardiovascular: Normal rate Pulmonary/chest: Effort normal Neurologic: Patient is alert Skin: Skin is warm Psychiatric: Patient has normal mood and affect    Ortho Exam: Ortho exam demonstrates AC joint tenderness right versus left.  He does have a little bit of popping in that [facility] joint with passive range of motion and crossarm adduction.  The distal clavicle is stable in terms of coracoclavicular ligament function.  Rotator  cuff strength is intact infraspinatus supraspinatus and subscap muscle testing.  No masses lymphadenopathy or skin changes noted in that shoulder girdle region.  Shoulder range of motion passively is 45/95/170.  Specialty Comments:  No specialty comments available.  Imaging: No results found.   PMFS History: Patient Active Problem List   Diagnosis Date Noted  . AC (acromioclavicular) arthritis 06/09/2020  . Tendinitis of right shoulder 04/26/2020  . Acromioclavicular sprain, right, initial encounter 03/23/2020  . Nonallopathic lesion  of cervical region 03/03/2020  . Nonallopathic lesion of lumbar region 03/03/2020  . Nonallopathic lesion of sacral region 03/03/2020  . Neck pain 03/03/2020  . Alcohol use disorder, in early remission ([place]) 01/18/2020  . Rheumatoid arthritis ([place]) 11/10/2018  . Hearing loss 11/12/2016  . Obesity, Class III, BMI 40-49.9 (morbid obesity) ([place]) 11/12/2016  . Osteoarthritis of lumbar spine 04/09/2016  . Tendinopathy of left rotator cuff 12/30/2015  . Obstructive sleep apnea 02/28/2015  . Type 2 diabetes mellitus with other specified complication ([place]) 58/52/7782  . Erectile dysfunction 01/06/2015  . Healthcare maintenance 01/06/2015  . Essential hypertension 01/06/2015   Past Medical History:  Diagnosis Date  . Anxiety   . Arthritis    lower back, hands  . Deviated septum    nasal turbinate hypertrophy  . Hypertension    states under control with meds., has been on medication since age 79  . Insulin dependent diabetes mellitus    type 2 DM  . Morbid obesity ([place])   . Sleep apnea    uses CPAP "most of the time", per pt.    Family History  Problem Relation Age of Onset  . Hyperlipidemia Mother   . Aneurysm Mother   . Hypertension Mother   . Diabetes Father   . Hyperlipidemia Father   . Hypertension Father   . Healthy Daughter   . Healthy Daughter     Past Surgical History:  Procedure Laterality Date  . CARPAL TUNNEL RELEASE Right 07/2014  . LAPAROSCOPIC GASTRIC SLEEVE RESECTION     Dr. Excell [name] 09-09-17  . LAPAROSCOPIC GASTRIC SLEEVE RESECTION N/A 09/09/2017   Procedure: LAPAROSCOPIC GASTRIC SLEEVE RESECTION  AND ERAS PATHWAY;  Surgeon: Adam Seltzer, MD;  Location: [facility];  Service: General;  Laterality: N/A;  . LUMBAR FUSION    . NASAL SEPTOPLASTY W/ TURBINOPLASTY Bilateral 03/22/2017   Procedure: NASAL SEPTOPLASTY WITH  BILATERAL INFERIOR   TURBINATE REDUCTION;  Surgeon: Adam Belfast, MD;  Location: [facility];  Service: ENT;  Laterality: Bilateral;  . SHOULDER  ARTHROSCOPY Left    x 2  . TONSILLECTOMY AND ADENOIDECTOMY    . TYMPANOSTOMY TUBE PLACEMENT Bilateral   . WISDOM TOOTH EXTRACTION     Social History   Occupational History  . Not on file  Tobacco Use  . Smoking status: Never Smoker  . Smokeless tobacco: Former Systems developer    Types: Secondary school teacher  . Vaping Use: Never used  Substance and Sexual Activity  . Alcohol use: Not Currently  . Drug use: No  . Sexual activity: Yes

## 2020-09-08 ENCOUNTER — Other Ambulatory Visit: Payer: Self-pay

## 2020-09-12 NOTE — Progress Notes (Addendum)
Surgical Instructions    Your procedure is scheduled on Friday, May 27th, 2022  Report to [facility] Main Entrance "A" at 09:50 A.M., then check in with the Admitting office.  Call this number if you have problems the morning of surgery:  [phone]   If you have any questions prior to your surgery date call [phone]: Open Monday-Friday 8am-4pm    Remember:  Do not eat after midnight the night before your surgery  You may drink clear liquids until 08:50 A.M. the morning of your surgery.   Clear liquids allowed are: Water, Non-Citrus Juices (without pulp), Carbonated Beverages, Clear Tea, Black Coffee Only, and Gatorade  Patient Instructions  . The day of surgery (if you have diabetes): o Drink ONE (1) 10 oz water bottle given to you in your pre admission testing appointment by 08:50 the morning of surgery. Drink in one sitting. Do not sip.  o This drink was given to you during your hospital  pre-op appointment visit.  o Nothing else to drink after completing the  10 oz bottle of water.          If you have questions, please contact your surgeon's office.     Take these medicines the morning of surgery with A SIP OF WATER   amLODipine (NORVASC)  DULoxetine (CYMBALTA) hydroxychloroquine (PLAQUENIL)  loratadine (CLARITIN)  simvastatin (ZOCOR)  If needed:  acetaminophen (TYLENOL)  HYDROcodone-acetaminophen ([place])   Follow your surgeon's instructions on when to stop Aspirin.  If no instructions were given by your surgeon then you will need to call the office to get those instructions.    As of today, STOP taking any Aspirin (unless otherwise instructed by your surgeon) Aleve, Naproxen, Ibuprofen, Motrin, Advil, Goody's, BC's, all herbal medications, fish oil, and all vitamins.   WHAT DO I DO ABOUT MY DIABETES MEDICATION?   Marland Kitchen Do not take dapagliflozin propanediol (FARXIGA) the day before surgery and the day of surgery  . The day of surgery, do not take metFORMIN  (GLUCOPHAGE) and pioglitazone (ACTOS)  . If your CBG is greater than 220 mg/dL, you may take  of your sliding scale (correction) dose of insulin.   HOW TO MANAGE YOUR DIABETES BEFORE AND AFTER SURGERY  Why is it important to control my blood sugar before and after surgery? . Improving blood sugar levels before and after surgery helps healing and can limit problems. . A way of improving blood sugar control is eating a healthy diet by: o  Eating less sugar and carbohydrates o  Increasing activity/exercise o  Talking with your doctor about reaching your blood sugar goals . High blood sugars (greater than 180 mg/dL) can raise your risk of infections and slow your recovery, so you will need to focus on controlling your diabetes during the weeks before surgery. . Make sure that the doctor who takes care of your diabetes knows about your planned surgery including the date and location.  How do I manage my blood sugar before surgery? . Check your blood sugar at least 4 times a day, starting 2 days before surgery, to make sure that the level is not too high or low.  . Check your blood sugar the morning of your surgery when you wake up and every 2 hours until you get to the Short Stay unit.  o If your blood sugar is less than 70 mg/dL, you will need to treat for low blood sugar: - Do not take insulin. - Treat a low blood sugar (less  than 70 mg/dL) with  cup of clear juice (cranberry or apple), 4 glucose tablets, OR glucose gel. - Recheck blood sugar in 15 minutes after treatment (to make sure it is greater than 70 mg/dL). If your blood sugar is not greater than 70 mg/dL on recheck, call [phone] for further instructions. . Report your blood sugar to the short stay nurse when you get to Short Stay.  . If you are admitted to the hospital after surgery: o Your blood sugar will be checked by the staff and you will probably be given insulin after surgery (instead of oral diabetes medicines) to  make sure you have good blood sugar levels. o The goal for blood sugar control after surgery is 80-180 mg/dL.                      Do not wear jewelry            Do not wear lotions, powders, colognes, or deodorant.            Men may shave face and neck.            Do not bring valuables to the hospital.            [facility] is not responsible for any belongings or valuables.  Do NOT Smoke (Tobacco/Vaping) or drink Alcohol 24 hours prior to your procedure If you use a CPAP at night, you may bring all equipment for your overnight stay.   Contacts, glasses, dentures or bridgework may not be worn into surgery, please bring cases for these belongings   For patients admitted to the hospital, discharge time will be determined by your treatment team.   Patients discharged the day of surgery will not be allowed to drive home, and someone needs to stay with them for 24 hours.   Oral Hygiene is also important to reduce your risk of infection.  Remember - BRUSH YOUR TEETH THE MORNING OF SURGERY WITH YOUR REGULAR TOOTHPASTE  [place]- Preparing for Total Shoulder Arthroplasty  Before surgery, you can play an important role. Because skin is not sterile, your skin needs to be as free of germs as possible. You can reduce the number of germs on your skin by using the following products.   Benzoyl Peroxide Gel  o Reduces the number of germs present on the skin  o Applied twice a day to shoulder area starting two days before surgery   Chlorhexidine Gluconate (CHG) Soap (instructions listed above on how to wash with CHG Soap)  o An antiseptic cleaner that kills germs and bonds with the skin to continue killing germs even after washing  o Used for showering the night before surgery and morning of surgery   ==================================================================  Please follow these instructions carefully:  BENZOYL PEROXIDE 5% GEL  Please do not use if you have an allergy  to benzoyl peroxide. If your skin becomes reddened/irritated stop using the benzoyl peroxide.  Starting two days before surgery, apply as follows:  1. Apply benzoyl peroxide in the morning and at night. Apply after taking a shower. If you are not taking a shower clean entire shoulder front, back, and side along with the armpit with a clean wet washcloth.  2. Place a quarter-sized dollop on your SHOULDER and rub in thoroughly, making sure to cover the front, back, and side of your shoulder, along with the armpit.   2 Days prior to Surgery First Dose on _____________ Morning Second Dose  on ______________ Night  Day Before Surgery First Dose on ______________ Morning Night before surgery wash (entire body except face and private areas) with CHG Soap THEN Second Dose on ____________ Night   Morning of Surgery  wash BODY AGAIN with CHG Soap   4. Do NOT apply benzoyl peroxide gel on the day of surgery   [place]- Preparing For Surgery  Before surgery, you can play an important role. Because skin is not sterile, your skin needs to be as free of germs as possible. You can reduce the number of germs on your skin by washing with CHG (chlorahexidine gluconate) Soap before surgery.  CHG is an antiseptic cleaner which kills germs and bonds with the skin to continue killing germs even after washing.     Please do not use if you have an allergy to CHG or antibacterial soaps. If your skin becomes reddened/irritated stop using the CHG.  Do not shave (including legs and underarms) for at least 48 hours prior to first CHG shower. It is OK to shave your face.  Please follow these instructions carefully.    1.  Shower the NIGHT BEFORE SURGERY and the MORNING OF SURGERY with CHG Soap.   If you chose to wash your hair, wash your hair first as usual with your normal shampoo. After you shampoo, rinse your hair and body thoroughly to remove the shampoo.  Then [employer] and genitals (private parts) with  your normal soap and rinse thoroughly to remove soap.  2. After that Use CHG Soap as you would any other liquid soap. You can apply CHG directly to the skin and wash gently with a scrungie or a clean washcloth.   3. Apply the CHG Soap to your body ONLY FROM THE NECK DOWN.  Do not use on open wounds or open sores. Avoid contact with your eyes, ears, mouth and genitals (private parts). Wash Face and genitals (private parts)  with your normal soap.   4. Wash thoroughly, paying special attention to the area where your surgery will be performed.  5. Thoroughly rinse your body with warm water from the neck down.  6. DO NOT shower/wash with your normal soap after using and rinsing off the CHG Soap.  7. Pat yourself dry with a CLEAN TOWEL.  8. Apply the Benzoyl Peroxide only the night before surgery.  Do Not use it the morning of surgery.  8. Wear CLEAN PAJAMAS to bed the night before surgery  9. Place CLEAN SHEETS on your bed the night before your surgery  10. DO NOT SLEEP WITH PETS.   Day of Surgery: Take a shower with CHG soap. Wear Clean/Comfortable clothing the morning of surgery Do not apply any deodorants/lotions.   Remember to brush your teeth WITH YOUR REGULAR TOOTHPASTE.   Please read over the following fact sheets that you were given.

## 2020-09-13 ENCOUNTER — Encounter (HOSPITAL_COMMUNITY): Payer: Self-pay

## 2020-09-13 ENCOUNTER — Encounter (HOSPITAL_COMMUNITY)
Admission: RE | Admit: 2020-09-13 | Discharge: 2020-09-13 | Disposition: A | Discharge status: Home / Self Care | Payer: 59 | Source: Ambulatory Visit | Attending: Orthopedic Surgery | Admitting: Orthopedic Surgery

## 2020-09-13 ENCOUNTER — Other Ambulatory Visit: Payer: Self-pay

## 2020-09-13 DIAGNOSIS — Z01818 Encounter for other preprocedural examination: Secondary | ICD-10-CM | POA: Diagnosis not present

## 2020-09-13 DIAGNOSIS — Z20822 Contact with and (suspected) exposure to covid-19: Secondary | ICD-10-CM | POA: Diagnosis not present

## 2020-09-13 LAB — BASIC METABOLIC PANEL
Anion gap: 11 (ref 5–15)
BUN: 13 mg/dL (ref 6–20)
CO2: 26 mmol/L (ref 22–32)
Calcium: 9.7 mg/dL (ref 8.9–10.3)
Chloride: 101 mmol/L (ref 98–111)
Creatinine, Ser: 0.94 mg/dL (ref 0.61–1.24)
GFR, Estimated: >60 mL/min (ref >60)
Glucose, Bld: 258 mg/dL — ABNORMAL HIGH (ref 70–99)
Potassium: 3.8 mmol/L (ref 3.5–5.1)
Sodium: 138 mmol/L (ref 135–145)

## 2020-09-13 LAB — GLUCOSE, CAPILLARY: Glucose-Capillary: 231 mg/dL — ABNORMAL HIGH (ref 70–99)

## 2020-09-13 LAB — CBC
HCT: 45.5 % (ref 39.0–52.0)
Hemoglobin: 15.7 g/dL (ref 13.0–17.0)
MCH: 30.2 pg (ref 26.0–34.0)
MCHC: 34.5 g/dL (ref 30.0–36.0)
MCV: 87.5 fL (ref 80.0–100.0)
Platelets: 250 10*3/uL (ref 150–400)
RBC: 5.2 MIL/uL (ref 4.22–5.81)
RDW: 12.5 % (ref 11.5–15.5)
WBC: 8.6 10*3/uL (ref 4.0–10.5)
nRBC: 0 % (ref 0.0–0.2)

## 2020-09-13 LAB — SURGICAL PCR SCREEN
MRSA, PCR: NEGATIVE
Staphylococcus aureus: POSITIVE — AB

## 2020-09-13 LAB — HEMOGLOBIN A1C
Hgb A1c MFr Bld: 8.8 % — ABNORMAL HIGH (ref 4.8–5.6)
Mean Plasma Glucose: 206 mg/dL

## 2020-09-13 LAB — SARS CORONAVIRUS 2 (TAT 6-24 HRS): SARS Coronavirus 2: NEGATIVE

## 2020-09-13 NOTE — Progress Notes (Addendum)
PCP - Axel Filler, MD Cardiologist - denies  PPM/ICD - denies Device Orders - N/A Rep Notified - N/A  Chest x-ray - N/A EKG - 09/13/2020 Stress Test - more than 10 years ago ECHO - denies Cardiac Cath - denies  Sleep Study - yes CPAP - not using at this time  Fasting Blood Sugar - 130-140 Checks Blood Sugar once a day CBG today - 231 A1C - today in PAT  Blood Thinner Instructions: N/A Aspirin Instructions: Patient was instructed to follow his surgeon's instructions on when to stop Aspirin.  If no instructions were given then the patient will need to call the office to get those instructions.    Patient was instructed: As of today, STOP taking any Aspirin (unless otherwise instructed by your surgeon) Aleve, Naproxen, Ibuprofen, Motrin, Advil, Goody's, BC's, all herbal medications, fish oil, and all vitamins.  ERAS Protcol - yes PRE-SURGERY water - yes  COVID TEST- 09/13/2020   Anesthesia review: Yes; abnormal EKG  Patient denies shortness of breath, fever, cough and chest pain at PAT appointment   All instructions explained to the patient, with a verbal understanding of the material. Patient agrees to go over the instructions while at home for a better understanding. Patient also instructed to self quarantine after being tested for COVID-19. The opportunity to ask questions was provided.

## 2020-09-14 NOTE — Progress Notes (Signed)
Hi Adam [name].  I called Adam [name].  His hemoglobin A1c is 8.8 but it is trending down significantly from over 11 2 months ago.  I told him that he is a little bit in the gray zone as far as an arthroscopic procedure on the shoulder.  Not too much literature out there about increased risk.  There is some data about increased risk for knee arthroscopy.  Discussed that with him and in general it is trending down and he would like to proceed even with a slightly increased risk of postop complication.  I think that is reasonable based on how his blood glucose is trending.  This is not an open procedure.  He appreciated the call.

## 2020-09-15 NOTE — Anesthesia Preprocedure Evaluation (Addendum)
Anesthesia Evaluation  Patient identified by MRN, date of birth, ID band Patient awake    Reviewed: Allergy & Precautions, NPO status , Patient's Chart, lab work & pertinent test results  History of Anesthesia Complications Negative for: history of anesthetic complications  Airway Mallampati: II  TM Distance: >3 FB Neck ROM: Full    Dental  (+) Teeth Intact   Pulmonary sleep apnea and Continuous Positive Airway Pressure Ventilation , former smoker,    Pulmonary exam normal        Cardiovascular hypertension, Pt. on medications Normal cardiovascular exam     Neuro/Psych Anxiety Depression negative neurological ROS     GI/Hepatic negative GI ROS, Neg liver ROS,   Endo/Other  diabetes, Type 2, Insulin Dependent, Oral Hypoglycemic Agents  Renal/GU negative Renal ROS  negative genitourinary   Musculoskeletal  (+) Arthritis , Rheumatoid disorders,    Abdominal   Peds  Hematology negative hematology ROS (+)   Anesthesia Other Findings   Reproductive/Obstetrics                            Anesthesia Physical Anesthesia Plan  ASA: III  Anesthesia Plan: General   Post-op Pain Management: GA combined w/ Regional for post-op pain   Induction: Intravenous  PONV Risk Score and Plan: 2 and Ondansetron, Dexamethasone, Treatment may vary due to age or medical condition and Midazolam  Airway Management Planned: Oral ETT  Additional Equipment: None  Intra-op Plan:   Post-operative Plan: Extubation in OR  Informed Consent: I have reviewed the patients History and Physical, chart, labs and discussed the procedure including the risks, benefits and alternatives for the proposed anesthesia with the patient or authorized representative who has indicated his/her understanding and acceptance.     Dental advisory given  Plan Discussed with:   Anesthesia Plan Comments:        Anesthesia Quick  Evaluation

## 2020-09-15 NOTE — Progress Notes (Signed)
Patient informed of surgery time change.  Surgery now starts at 0730-10 am.  Patient informed to be here at 0530. Clear liquids til 0430 on DOS.  Patient verbalized understanding.

## 2020-09-16 ENCOUNTER — Encounter (HOSPITAL_COMMUNITY)
Admission: RE | Disposition: A | Discharge status: Home / Self Care | Payer: Self-pay | Source: Home / Self Care | Attending: Orthopedic Surgery

## 2020-09-16 ENCOUNTER — Ambulatory Visit (HOSPITAL_COMMUNITY)
Admission: RE | Admit: 2020-09-16 | Discharge: 2020-09-16 | Disposition: A | Discharge status: Home / Self Care | Payer: 59 | Attending: Orthopedic Surgery | Admitting: Orthopedic Surgery

## 2020-09-16 ENCOUNTER — Encounter: Payer: Self-pay | Admitting: Orthopedic Surgery

## 2020-09-16 ENCOUNTER — Other Ambulatory Visit: Payer: Self-pay

## 2020-09-16 ENCOUNTER — Ambulatory Visit (HOSPITAL_COMMUNITY): Payer: 59 | Admitting: Physician Assistant

## 2020-09-16 ENCOUNTER — Encounter (HOSPITAL_COMMUNITY): Payer: Self-pay | Admitting: Orthopedic Surgery

## 2020-09-16 ENCOUNTER — Ambulatory Visit (HOSPITAL_COMMUNITY): Payer: 59 | Admitting: Anesthesiology

## 2020-09-16 ENCOUNTER — Other Ambulatory Visit (HOSPITAL_COMMUNITY): Payer: Self-pay

## 2020-09-16 DIAGNOSIS — Z7982 Long term (current) use of aspirin: Secondary | ICD-10-CM | POA: Diagnosis not present

## 2020-09-16 DIAGNOSIS — Z79899 Other long term (current) drug therapy: Secondary | ICD-10-CM | POA: Diagnosis not present

## 2020-09-16 DIAGNOSIS — G4733 Obstructive sleep apnea (adult) (pediatric): Secondary | ICD-10-CM | POA: Diagnosis not present

## 2020-09-16 DIAGNOSIS — Z888 Allergy status to other drugs, medicaments and biological substances status: Secondary | ICD-10-CM | POA: Insufficient documentation

## 2020-09-16 DIAGNOSIS — F17220 Nicotine dependence, chewing tobacco, uncomplicated: Secondary | ICD-10-CM | POA: Diagnosis not present

## 2020-09-16 DIAGNOSIS — Z6830 Body mass index (BMI) 30.0-30.9, adult: Secondary | ICD-10-CM | POA: Diagnosis not present

## 2020-09-16 DIAGNOSIS — M19011 Primary osteoarthritis, right shoulder: Secondary | ICD-10-CM | POA: Insufficient documentation

## 2020-09-16 DIAGNOSIS — Z9989 Dependence on other enabling machines and devices: Secondary | ICD-10-CM | POA: Diagnosis not present

## 2020-09-16 DIAGNOSIS — I10 Essential (primary) hypertension: Secondary | ICD-10-CM | POA: Diagnosis not present

## 2020-09-16 DIAGNOSIS — Z7984 Long term (current) use of oral hypoglycemic drugs: Secondary | ICD-10-CM | POA: Insufficient documentation

## 2020-09-16 DIAGNOSIS — G8918 Other acute postprocedural pain: Secondary | ICD-10-CM | POA: Diagnosis not present

## 2020-09-16 HISTORY — PX: SHOULDER ARTHROSCOPY WITH DISTAL CLAVICLE RESECTION: SHX5675

## 2020-09-16 LAB — GLUCOSE, CAPILLARY
Glucose-Capillary: 120 mg/dL — ABNORMAL HIGH (ref 70–99)
Glucose-Capillary: 192 mg/dL — ABNORMAL HIGH (ref 70–99)

## 2020-09-16 SURGERY — SHOULDER ARTHROSCOPY WITH DISTAL CLAVICLE RESECTION
Anesthesia: General | Site: Shoulder | Laterality: Right

## 2020-09-16 MED ORDER — DEXAMETHASONE SODIUM PHOSPHATE 10 MG/ML IJ SOLN
INTRAMUSCULAR | Status: DC | PRN
  Administered 2020-09-16: 5 mg via INTRAVENOUS

## 2020-09-16 MED ORDER — SODIUM CHLORIDE 0.9 % IR SOLN
Status: DC | PRN
  Administered 2020-09-16 (×4): 3000 mL

## 2020-09-16 MED ORDER — LIDOCAINE 2% (20 MG/ML) 5 ML SYRINGE
INTRAMUSCULAR | Status: DC | PRN
  Administered 2020-09-16: 100 mg via INTRAVENOUS

## 2020-09-16 MED ORDER — MIDAZOLAM HCL 5 MG/5ML IJ SOLN
INTRAMUSCULAR | Status: DC | PRN
  Administered 2020-09-16 (×2): 1 mg via INTRAVENOUS

## 2020-09-16 MED ORDER — 0.9 % SODIUM CHLORIDE (POUR BTL) OPTIME
TOPICAL | Status: DC | PRN
  Administered 2020-09-16: 1000 mL

## 2020-09-16 MED ORDER — PROPOFOL 10 MG/ML IV BOLUS
INTRAVENOUS | Status: AC
  Filled 2020-09-16: qty 20

## 2020-09-16 MED ORDER — ORAL CARE MOUTH RINSE: 15.0000 mL | Freq: Once | OROMUCOSAL | Status: DC

## 2020-09-16 MED ORDER — PROPOFOL 10 MG/ML IV BOLUS
INTRAVENOUS | Status: DC | PRN
  Administered 2020-09-16: 200 mg via INTRAVENOUS

## 2020-09-16 MED ORDER — MIDAZOLAM HCL 2 MG/2ML IJ SOLN
INTRAMUSCULAR | Status: AC
  Filled 2020-09-16: qty 2

## 2020-09-16 MED ORDER — LACTATED RINGERS IV SOLN: INTRAVENOUS | Status: DC

## 2020-09-16 MED ORDER — EPINEPHRINE PF 1 MG/ML IJ SOLN
INTRAMUSCULAR | Status: DC | PRN
  Administered 2020-09-16 (×2): 1 mg
  Administered 2020-09-16: .15 mL via INTRAMUSCULAR
  Administered 2020-09-16 (×2): 1 mg

## 2020-09-16 MED ORDER — ORAL CARE MOUTH RINSE: 15.0000 mL | Freq: Once | OROMUCOSAL | Status: AC

## 2020-09-16 MED ORDER — ROCURONIUM BROMIDE 10 MG/ML (PF) SYRINGE
PREFILLED_SYRINGE | INTRAVENOUS | Status: AC
  Filled 2020-09-16: qty 10

## 2020-09-16 MED ORDER — DEXAMETHASONE SODIUM PHOSPHATE 10 MG/ML IJ SOLN
INTRAMUSCULAR | Status: AC
  Filled 2020-09-16: qty 1

## 2020-09-16 MED ORDER — ROCURONIUM BROMIDE 10 MG/ML (PF) SYRINGE
PREFILLED_SYRINGE | INTRAVENOUS | Status: DC | PRN
  Administered 2020-09-16: 100 mg via INTRAVENOUS

## 2020-09-16 MED ORDER — EPINEPHRINE PF 1 MG/ML IJ SOLN
INTRAMUSCULAR | Status: AC
  Filled 2020-09-16: qty 1

## 2020-09-16 MED ORDER — BUPIVACAINE LIPOSOME 1.3 % IJ SUSP
INTRAMUSCULAR | Status: DC | PRN
  Administered 2020-09-16: 10 mL

## 2020-09-16 MED ORDER — KETOROLAC TROMETHAMINE 10 MG PO TABS
10.0000 mg | ORAL_TABLET | Freq: Three times a day (TID) | ORAL | 0 refills | Status: DC | PRN
  Filled 2020-09-16: qty 15, 5d supply, fill #0

## 2020-09-16 MED ORDER — ONDANSETRON HCL 4 MG/2ML IJ SOLN
INTRAMUSCULAR | Status: AC
  Filled 2020-09-16: qty 2

## 2020-09-16 MED ORDER — AMISULPRIDE (ANTIEMETIC) 5 MG/2ML IV SOLN
10.0000 mg | Freq: Once | INTRAVENOUS | Status: DC | PRN
Start: 2020-09-16 — End: 2020-09-16

## 2020-09-16 MED ORDER — FENTANYL CITRATE (PF) 100 MCG/2ML IJ SOLN: 25.0000 ug | INTRAMUSCULAR | Status: DC | PRN

## 2020-09-16 MED ORDER — SUGAMMADEX SODIUM 200 MG/2ML IV SOLN
INTRAVENOUS | Status: DC | PRN
  Administered 2020-09-16: 300 mg via INTRAVENOUS

## 2020-09-16 MED ORDER — OXYCODONE-ACETAMINOPHEN 5-325 MG PO TABS
1.0000 | ORAL_TABLET | ORAL | Status: DC | PRN
  Administered 2020-09-16: 1 via ORAL

## 2020-09-16 MED ORDER — SODIUM CHLORIDE (PF) 0.9 % IJ SOLN
INTRAMUSCULAR | Status: DC | PRN
  Administered 2020-09-16: 20 mL

## 2020-09-16 MED ORDER — ONDANSETRON HCL 4 MG/2ML IJ SOLN: 4.0000 mg | Freq: Once | INTRAMUSCULAR | Status: DC | PRN

## 2020-09-16 MED ORDER — BUPIVACAINE HCL (PF) 0.25 % IJ SOLN
INTRAMUSCULAR | Status: AC
  Filled 2020-09-16: qty 30

## 2020-09-16 MED ORDER — FENTANYL CITRATE (PF) 250 MCG/5ML IJ SOLN
INTRAMUSCULAR | Status: AC
  Filled 2020-09-16: qty 5

## 2020-09-16 MED ORDER — CHLORHEXIDINE GLUCONATE 0.12 % MT SOLN
15.0000 mL | Freq: Once | OROMUCOSAL | Status: AC
  Administered 2020-09-16: 15 mL via OROMUCOSAL

## 2020-09-16 MED ORDER — TRANEXAMIC ACID-NACL 1000-0.7 MG/100ML-% IV SOLN
1000.0000 mg | INTRAVENOUS | Status: AC
  Administered 2020-09-16: 1000 mg via INTRAVENOUS
  Filled 2020-09-16: qty 100

## 2020-09-16 MED ORDER — OXYCODONE-ACETAMINOPHEN 5-325 MG PO TABS
ORAL_TABLET | ORAL | Status: AC
  Filled 2020-09-16: qty 1

## 2020-09-16 MED ORDER — EPINEPHRINE PF 1 MG/ML IJ SOLN
INTRAMUSCULAR | Status: AC
  Filled 2020-09-16: qty 4

## 2020-09-16 MED ORDER — CEFAZOLIN SODIUM-DEXTROSE 2-4 GM/100ML-% IV SOLN
2.0000 g | INTRAVENOUS | Status: AC
Start: 2020-09-16 — End: 2020-09-16
  Administered 2020-09-16: 2 g via INTRAVENOUS
  Filled 2020-09-16: qty 100

## 2020-09-16 MED ORDER — CHLORHEXIDINE GLUCONATE 0.12 % MT SOLN
15.0000 mL | Freq: Once | OROMUCOSAL | Status: DC
  Filled 2020-09-16: qty 15

## 2020-09-16 MED ORDER — OXYCODONE-ACETAMINOPHEN 5-325 MG PO TABS
1.0000 | ORAL_TABLET | ORAL | 0 refills | Status: DC | PRN
  Filled 2020-09-16: qty 30, 5d supply, fill #0

## 2020-09-16 MED ORDER — OXYCODONE HCL 5 MG/5ML PO SOLN: 5.0000 mg | Freq: Once | ORAL | Status: DC | PRN

## 2020-09-16 MED ORDER — ONDANSETRON HCL 4 MG/2ML IJ SOLN
INTRAMUSCULAR | Status: DC | PRN
  Administered 2020-09-16: 4 mg via INTRAVENOUS

## 2020-09-16 MED ORDER — OXYCODONE HCL 5 MG PO TABS: 5.0000 mg | ORAL_TABLET | Freq: Once | ORAL | Status: DC | PRN

## 2020-09-16 MED ORDER — BUPIVACAINE HCL (PF) 0.5 % IJ SOLN
INTRAMUSCULAR | Status: DC | PRN
  Administered 2020-09-16: 15 mL via PERINEURAL

## 2020-09-16 MED ORDER — FENTANYL CITRATE (PF) 250 MCG/5ML IJ SOLN
INTRAMUSCULAR | Status: DC | PRN
  Administered 2020-09-16: 50 ug via INTRAVENOUS

## 2020-09-16 MED ORDER — LIDOCAINE 2% (20 MG/ML) 5 ML SYRINGE
INTRAMUSCULAR | Status: AC
  Filled 2020-09-16: qty 5

## 2020-09-16 MED ORDER — MUPIROCIN 2 % EX OINT
TOPICAL_OINTMENT | Freq: Two times a day (BID) | CUTANEOUS | Status: DC
  Administered 2020-09-16: 1 via NASAL
  Filled 2020-09-16 (×2): qty 22

## 2020-09-16 SURGICAL SUPPLY — 68 items
ALCOHOL 70% 16 OZ (MISCELLANEOUS) ×2 IMPLANT
BLADE CUTTER GATOR 3.5 (BLADE) IMPLANT
BLADE EXCALIBUR 4.0X13 (MISCELLANEOUS) IMPLANT
BLADE SURG 11 STRL SS (BLADE) ×2 IMPLANT
BUR OVAL 6.0 (BURR) IMPLANT
BURR OVAL 8 FLU 4.0X13 (MISCELLANEOUS) ×2 IMPLANT
COVER SURGICAL LIGHT HANDLE (MISCELLANEOUS) ×2 IMPLANT
COVER WAND RF STERILE (DRAPES) ×2 IMPLANT
DRAPE INCISE IOBAN 66X45 STRL (DRAPES) ×4 IMPLANT
DRAPE STERI 35X30 U-POUCH (DRAPES) ×2 IMPLANT
DRAPE U-SHAPE 47X51 STRL (DRAPES) ×4 IMPLANT
DRSG TEGADERM 4X4.5 CHG (GAUZE/BANDAGES/DRESSINGS) ×6 IMPLANT
DRSG TEGADERM 4X4.75 (GAUZE/BANDAGES/DRESSINGS) ×6 IMPLANT
DRSG XEROFORM 1X8 (GAUZE/BANDAGES/DRESSINGS) ×2 IMPLANT
DURAPREP 26ML APPLICATOR (WOUND CARE) ×2 IMPLANT
ELECT REM PT RETURN 9FT ADLT (ELECTROSURGICAL) ×2
ELECTRODE REM PT RTRN 9FT ADLT (ELECTROSURGICAL) ×1 IMPLANT
FILTER STRAW FLUID ASPIR (MISCELLANEOUS) ×2 IMPLANT
GAUZE SPONGE 4X4 12PLY STRL (GAUZE/BANDAGES/DRESSINGS) ×2 IMPLANT
GAUZE XEROFORM 1X8 LF (GAUZE/BANDAGES/DRESSINGS) ×2 IMPLANT
GLOVE ECLIPSE 7.0 STRL STRAW (GLOVE) ×2 IMPLANT
GLOVE ECLIPSE 8.0 STRL XLNG CF (GLOVE) ×2 IMPLANT
GLOVE SRG 8 PF TXTR STRL LF DI (GLOVE) ×1 IMPLANT
GLOVE SURG UNDER POLY LF SZ7 (GLOVE) ×2 IMPLANT
GLOVE SURG UNDER POLY LF SZ8 (GLOVE) ×1
GOWN STRL REUS W/ TWL LRG LVL3 (GOWN DISPOSABLE) ×3 IMPLANT
GOWN STRL REUS W/TWL LRG LVL3 (GOWN DISPOSABLE) ×3
HYDROGEN PEROXIDE 16OZ (MISCELLANEOUS) ×2 IMPLANT
KIT BASIN OR (CUSTOM PROCEDURE TRAY) ×2 IMPLANT
KIT TURNOVER KIT B (KITS) ×2 IMPLANT
MANIFOLD NEPTUNE II (INSTRUMENTS) ×2 IMPLANT
NDL SUT 6 .5 CRC .975X.05 MAYO (NEEDLE) ×1 IMPLANT
NEEDLE HYPO 25X1 1.5 SAFETY (NEEDLE) ×2 IMPLANT
NEEDLE MAYO TAPER (NEEDLE) ×1
NEEDLE SCORPION MULTI FIRE (NEEDLE) IMPLANT
NEEDLE SPNL 18GX3.5 QUINCKE PK (NEEDLE) ×2 IMPLANT
NS IRRIG 1000ML POUR BTL (IV SOLUTION) ×2 IMPLANT
PACK SHOULDER (CUSTOM PROCEDURE TRAY) ×2 IMPLANT
PAD ARMBOARD 7.5X6 YLW CONV (MISCELLANEOUS) ×4 IMPLANT
PORT APPOLLO RF 90DEGREE MULTI (SURGICAL WAND) ×2 IMPLANT
RESTRAINT HEAD UNIVERSAL NS (MISCELLANEOUS) ×2 IMPLANT
SLING ARM IMMOBILIZER LRG (SOFTGOODS) IMPLANT
SOL PREP POV-IOD 4OZ 10% (MISCELLANEOUS) ×4 IMPLANT
SPONGE LAP 4X18 RFD (DISPOSABLE) ×2 IMPLANT
STRIP CLOSURE SKIN 1/2X4 (GAUZE/BANDAGES/DRESSINGS) ×2 IMPLANT
SUCTION FRAZIER HANDLE 10FR (MISCELLANEOUS) ×1
SUCTION TUBE FRAZIER 10FR DISP (MISCELLANEOUS) ×1 IMPLANT
SUT 2 FIBERLOOP 20 STRT BLUE (SUTURE)
SUT ETHILON 3 0 PS 1 (SUTURE) ×4 IMPLANT
SUT FIBERWIRE #2 38 T-5 BLUE (SUTURE)
SUT MNCRL AB 3-0 PS2 18 (SUTURE) ×2 IMPLANT
SUT VIC AB 0 CT1 27 (SUTURE) ×2
SUT VIC AB 0 CT1 27XBRD ANBCTR (SUTURE) ×2 IMPLANT
SUT VIC AB 1 CT1 27 (SUTURE) ×1
SUT VIC AB 1 CT1 27XBRD ANBCTR (SUTURE) ×1 IMPLANT
SUT VIC AB 2-0 CT1 27 (SUTURE) ×1
SUT VIC AB 2-0 CT1 TAPERPNT 27 (SUTURE) ×1 IMPLANT
SUT VICRYL 0 UR6 27IN ABS (SUTURE) ×2 IMPLANT
SUTURE 2 FIBERLOOP 20 STRT BLU (SUTURE) IMPLANT
SUTURE FIBERWR #2 38 T-5 BLUE (SUTURE) IMPLANT
SYR 20ML LL LF (SYRINGE) ×4 IMPLANT
SYR 30ML LL (SYRINGE) ×2 IMPLANT
SYR 3ML LL SCALE MARK (SYRINGE) ×2 IMPLANT
SYR TB 1ML LUER SLIP (SYRINGE) ×2 IMPLANT
TOWEL GREEN STERILE (TOWEL DISPOSABLE) ×2 IMPLANT
TOWEL GREEN STERILE FF (TOWEL DISPOSABLE) ×2 IMPLANT
TUBING ARTHROSCOPY IRRIG 16FT (MISCELLANEOUS) ×2 IMPLANT
WATER STERILE IRR 1000ML POUR (IV SOLUTION) ×2 IMPLANT

## 2020-09-16 NOTE — Anesthesia Postprocedure Evaluation (Signed)
Anesthesia Post Note  Patient: Adam [name]  Procedure(s) Performed: RIGHT SHOULDER ARTHROSCOPY WITH ARTHROSCOPIC DISTAL CLAVICLE EXCISION (Right Shoulder)     Patient location during evaluation: PACU Anesthesia Type: General Level of consciousness: awake and alert Pain management: pain level controlled Vital Signs Assessment: post-procedure vital signs reviewed and stable Respiratory status: spontaneous breathing, nonlabored ventilation and respiratory function stable Cardiovascular status: blood pressure returned to baseline and stable Postop Assessment: no apparent nausea or vomiting Anesthetic complications: no   No complications documented.  Last Vitals:  Vitals:   09/16/20 1015 09/16/20 1025  BP:  (!) 144/74  Pulse: 72 68  Resp: 19 18  Temp:  36.5 C  SpO2: 97% 96%    Last Pain:  Vitals:   09/16/20 0955  TempSrc:   PainSc: 3                  Lidia Collum

## 2020-09-16 NOTE — Transfer of Care (Signed)
Immediate Anesthesia Transfer of Care Note  Patient: Sylvan Sookdeo  Procedure(s) Performed: RIGHT SHOULDER ARTHROSCOPY WITH ARTHROSCOPIC DISTAL CLAVICLE EXCISION (Right Shoulder)  Patient Location: PACU  Anesthesia Type:GA combined with regional for post-op pain  Level of Consciousness: awake, alert  and oriented  Airway & Oxygen Therapy: Patient Spontanous Breathing  Post-op Assessment: Report given to RN and Post -op Vital signs reviewed and stable  Post vital signs: Reviewed and stable  Last Vitals:  Vitals Value Taken Time  BP 158/78 09/16/20 0956  Temp 36.5 C 09/16/20 0955  Pulse 80 09/16/20 0957  Resp 17 09/16/20 0957  SpO2 97 % 09/16/20 0957  Vitals shown include unvalidated device data.  Last Pain:  Vitals:   09/16/20 0955  TempSrc:   PainSc: 3          Complications: No complications documented.

## 2020-09-16 NOTE — Anesthesia Procedure Notes (Signed)
Anesthesia Regional Block: Interscalene brachial plexus block   Pre-Anesthetic Checklist: ,, timeout performed, Correct Patient, Correct Site, Correct Laterality, Correct Procedure, Correct Position, site marked, Risks and benefits discussed,  Surgical consent,  Pre-op evaluation,  At surgeon's request and post-op pain management  Laterality: Right  Prep: chloraprep       Needles:  Injection technique: Single-shot  Needle Type: Echogenic Stimulator Needle     Needle Length: 10cm  Needle Gauge: 20     Additional Needles:   Procedures:,,,, ultrasound used (permanent image in chart),,,,  Narrative:  Start time: 09/16/2020 7:10 AM End time: 09/16/2020 7:14 AM Injection made incrementally with aspirations every 5 mL.  Performed by: Personally  Anesthesiologist: Lidia Collum, MD  Additional Notes: Standard monitors applied. Skin prepped. Good needle visualization with ultrasound. Injection made in 5cc increments with no resistance to injection. Patient tolerated the procedure well.

## 2020-09-16 NOTE — H&P (Signed)
Adam [name] is an 50 y.o. male.   Chief Complaint: Right shoulder pain HPI: Adam [name] is a 50 year old patient with right shoulder pain.  He describes popping in the clavicle region.  He is a right-sided sleeper and is hard for him to sleep on that side.  Has been taking Tylenol Norco and Motrin for the problem.  Sustained an electrocution injury in 1999 and had subsequent left shoulder surgery for that.  Patient describes having a fall in November 2021 on the right-hand side.  Describes relatively constant pain in the superior aspect of the shoulder.  MRI scan has been performed which shows intact rotator cuff intact biceps tendon and moderate arthritis in the [facility] joint.  Patient describes having had several injections which have helped particularly the one into the [facility] joint from direct superior approach.  However that relief was short-lived and his pain has recurred.  Past Medical History:  Diagnosis Date  . Anxiety   . Arthritis    lower back, hands  . Depression   . Deviated septum    nasal turbinate hypertrophy  . Hypertension    states under control with meds., has been on medication since age 19  . Insulin dependent diabetes mellitus    type 2 DM  . Morbid obesity ([place])   . Sleep apnea    uses CPAP "most of the time", per pt.    Past Surgical History:  Procedure Laterality Date  . CARPAL TUNNEL RELEASE Right 07/2014  . LAPAROSCOPIC GASTRIC SLEEVE RESECTION     Dr. Excell Seltzer 09-09-17  . LAPAROSCOPIC GASTRIC SLEEVE RESECTION N/A 09/09/2017   Procedure: LAPAROSCOPIC GASTRIC SLEEVE RESECTION  AND ERAS PATHWAY;  Surgeon: Excell Seltzer, MD;  Location: [facility];  Service: General;  Laterality: N/A;  . LUMBAR FUSION    . NASAL SEPTOPLASTY W/ TURBINOPLASTY Bilateral 03/22/2017   Procedure: NASAL SEPTOPLASTY WITH  BILATERAL INFERIOR   TURBINATE REDUCTION;  Surgeon: Jerrell Belfast, MD;  Location: [place];  Service: ENT;  Laterality: Bilateral;  . SHOULDER ARTHROSCOPY Left    x 2  .  TONSILLECTOMY AND ADENOIDECTOMY    . TYMPANOSTOMY TUBE PLACEMENT Bilateral   . WISDOM TOOTH EXTRACTION      Family History  Problem Relation Age of Onset  . Hyperlipidemia Mother   . Aneurysm Mother   . Hypertension Mother   . Diabetes Father   . Hyperlipidemia Father   . Hypertension Father   . Healthy Daughter   . Healthy Daughter    Social History:  reports that he has quit smoking. His smokeless tobacco use includes chew. He reports previous alcohol use. He reports that he does not use drugs.  Allergies:  Allergies  Allergen Reactions  . Chlorhexidine Itching and Rash    Medications Prior to Admission  Medication Sig Dispense Refill  . acetaminophen (TYLENOL) 500 MG tablet Take 1,000 mg by mouth every 6 (six) hours as needed for moderate pain.    Marland Kitchen amLODipine (NORVASC) 5 MG tablet TAKE 1 TABLET BY MOUTH ONCE A DAY (Patient taking differently: Take 5 mg by mouth daily.) 90 tablet 2  . aspirin EC 81 MG tablet Take 81 mg by mouth daily.    . cholecalciferol (VITAMIN D3) 25 MCG (1000 UNIT) tablet Take 1,000 Units by mouth daily.    . dapagliflozin propanediol (FARXIGA) 5 MG TABS tablet Take 1 tablet (5 mg total) by mouth daily before breakfast. 330 tablet 0  . DULoxetine (CYMBALTA) 60 MG capsule TAKE 1 CAPSULE BY MOUTH ONCE  A DAY (Patient taking differently: Take 60 mg by mouth daily.) 90 capsule 3  . hydrochlorothiazide (HYDRODIURIL) 25 MG tablet TAKE 1 TABLET BY MOUTH ONCE A DAY (Patient taking differently: Take 25 mg by mouth daily.) 90 tablet 2  . HYDROcodone-acetaminophen (NORCO) 7.5-325 MG tablet TAKE 1 TABLET BY MOUTH EVERY 12 HOURS AS NEEDED FOR MODERATE PAIN. THIS IS A 30 DAY SUPPLY. (Patient taking differently: Take 1 tablet by mouth every 12 (twelve) hours as needed for moderate pain.) 45 tablet 0  . hydroxychloroquine (PLAQUENIL) 200 MG tablet Take 1 tablet (200 mg total) by mouth 2 (two) times daily. 180 tablet 0  . loratadine (CLARITIN) 10 MG tablet Take 10 mg by  mouth daily.    Marland Kitchen losartan (COZAAR) 100 MG tablet TAKE 1 TABLET BY MOUTH DAILY. (Patient taking differently: Take 100 mg by mouth daily.) 90 tablet 3  . metFORMIN (GLUCOPHAGE) 1000 MG tablet TAKE 1 TABLET BY MOUTH TWICE DAILY WITH A MEAL. (Patient taking differently: Take 1,000 mg by mouth 2 (two) times daily with a meal.) 180 tablet 3  . naproxen sodium (ALEVE) 220 MG tablet Take 440 mg by mouth daily as needed (pain).    . pioglitazone (ACTOS) 45 MG tablet TAKE 1 TABLET BY MOUTH DAILY. (Patient taking differently: Take 45 mg by mouth daily.) 90 tablet 3  . simvastatin (ZOCOR) 20 MG tablet TAKE 1 TABLET BY MOUTH ONCE DAILY (Patient taking differently: Take 20 mg by mouth daily.) 90 tablet 0  . tadalafil (CIALIS) 20 MG tablet TAKE 1 TABLET BY MOUTH DAILY AS NEEDED FOR ERECTILE DYSFUNCTION. (Patient taking differently: Take 20 mg by mouth daily as needed for erectile dysfunction.) 30 tablet 2  . tiZANidine (ZANAFLEX) 4 MG tablet Take 1 tablet (4 mg total) by mouth every 8 (eight) hours as needed for muscle spasms. (Patient taking differently: Take 4 mg by mouth at bedtime.) 90 tablet 1  . predniSONE (DELTASONE) 5 MG tablet Take 4 tabs po x 4 days, 3  tabs po x 4 days, 2  tabs po x 4 days, 1  tab po x 4 days (Patient not taking: No sig reported) 40 tablet 0    Results for orders placed or performed during the hospital encounter of 09/16/20 (from the past 48 hour(s))  Glucose, capillary     Status: Abnormal   Collection Time: 09/16/20  5:51 AM  Result Value Ref Range   Glucose-Capillary 120 (H) 70 - 99 mg/dL    Comment: Glucose reference range applies only to samples taken after fasting for at least 8 hours.   No results found.  Review of Systems  Musculoskeletal: Positive for arthralgias.  All other systems reviewed and are negative.   Blood pressure (!) 142/75, pulse (!) 59, temperature 97.7 F (36.5 C), temperature source Oral, resp. rate 17, height 5' 11.5" (1.816 m), weight 99.8 kg, SpO2  96 %. Physical Exam Vitals reviewed.  HENT:     Head: Normocephalic.     Nose: Nose normal.     Mouth/Throat:     Mouth: Mucous membranes are moist.  Eyes:     Pupils: Pupils are equal, round, and reactive to light.  Cardiovascular:     Rate and Rhythm: Normal rate.     Pulses: Normal pulses.  Pulmonary:     Effort: Pulmonary effort is normal.  Abdominal:     General: Abdomen is flat.  Musculoskeletal:     Cervical back: Normal range of motion.  Skin:    General:  Skin is warm.     Capillary Refill: Capillary refill takes less than 2 seconds.  Neurological:     General: No focal deficit present.     Mental Status: He is alert.  Psychiatric:        Mood and Affect: Mood normal.     Ortho exam demonstrates AC joint tenderness right versus left.  He does have a little bit of popping in that [facility] joint with passive range of motion and crossarm adduction.  The distal clavicle is stable in terms of coracoclavicular ligament function.  Rotator cuff strength is intact infraspinatus supraspinatus and subscap muscle testing.  No masses lymphadenopathy or skin changes noted in that shoulder girdle region.  Shoulder range of motion passively is 45/95/170.  Assessment/Plan  Impression is symptomatic right shoulder AC joint arthritis.  Rotator cuff strength and passive range of motion are good at this time.  We discussed operative and nonoperative treatment options for this problem.  He would prefer a more definitive solution for 6 months of symptoms.  No other discrete pathology present in the right shoulder.  He did get relief from the [facility] joint injection.  Plan at this time would be arthroscopic distal clavicle excision.  The risk and benefits are discussed including not limited to shoulder stiffness incomplete pain relief as well as a period where he may not be able to sleep on that right-hand side which would likely be about 3 to 4 weeks.  Anticipate about 1 week out of work.  He would need to who  avoid physical type of lifting activities for the first 3 to 4 weeks after surgery.  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  Patient understands risk benefits and wishes to proceed.  All questions answered  Anderson Malta, MD 09/16/2020, 7:30 AM

## 2020-09-16 NOTE — Anesthesia Procedure Notes (Signed)
Procedure Name: Intubation Date/Time: 09/16/2020 8:13 AM Performed by: Trinna Post., CRNA Pre-anesthesia Checklist: Patient identified, Emergency Drugs available, Suction available, Patient being monitored and Timeout performed Patient Re-evaluated:Patient Re-evaluated prior to induction Oxygen Delivery Method: Circle system utilized Preoxygenation: Pre-oxygenation with 100% oxygen Induction Type: IV induction Ventilation: Mask ventilation without difficulty Laryngoscope Size: Mac and 4 Grade View: Grade II Tube type: Oral Tube size: 7.5 mm Number of attempts: 1 Airway Equipment and Method: Stylet Placement Confirmation: ETT inserted through vocal cords under direct vision,  positive ETCO2 and breath sounds checked- equal and bilateral Secured at: 22 cm Tube secured with: Tape Dental Injury: Teeth and Oropharynx as per pre-operative assessment

## 2020-09-17 ENCOUNTER — Encounter (HOSPITAL_COMMUNITY): Payer: Self-pay | Admitting: Orthopedic Surgery

## 2020-09-20 NOTE — Brief Op Note (Signed)
   09/16/2020  8:28 AM  PATIENT:  Adam [name]  50 y.o. male  PRE-OPERATIVE DIAGNOSIS:  right shoulder acromioclavicular osteoarthritis  POST-OPERATIVE DIAGNOSIS:  right shoulder acromioclavicular osteoarthritis  PROCEDURE:  Procedure(s): RIGHT SHOULDER ARTHROSCOPY WITH ARTHROSCOPIC DISTAL CLAVICLE EXCISION  SURGEON:  Surgeon(s): Marlou Sa Tonna Corner, MD  ASSISTANT: Annie Main, PA  ANESTHESIA:   general  EBL: 5 ml    No intake/output data recorded.  BLOOD ADMINISTERED: none  DRAINS: none   LOCAL MEDICATIONS USED:  none  SPECIMEN:  No Specimen  COUNTS:  YES  TOURNIQUET:  * No tourniquets in log *  DICTATION: .Other Dictation: Dictation Number [id number]  PLAN OF CARE: Discharge to home after PACU  PATIENT DISPOSITION:  PACU - hemodynamically stable

## 2020-09-20 NOTE — Op Note (Signed)
NAMENERI, SAMEK MEDICAL RECORD NO: [id number] ACCOUNT NO: [id number] DATE OF BIRTH: Feb 27, 1971 FACILITY: [facility] LOCATION: [facility] PHYSICIAN: Yetta Barre. Marlou Sa, MD  Operative Report   DATE OF PROCEDURE: 09/16/2020   PREOPERATIVE DIAGNOSIS:  Right shoulder acromioclavicular joint arthritis.  POSTOPERATIVE DIAGNOSIS:  Right shoulder acromioclavicular joint arthritis.  PROCEDURE:  Right shoulder arthroscopy with arthroscopic distal clavicle excision.  SURGEON:  Yetta Barre. Marlou Sa, MD.  ASSISTANT:  Annie Main, PA.  INDICATIONS:  The patient is a 50 year old patient with right shoulder pain refractory to nonoperative management.  MRI scan shows AC joint arthritis.  He has had good, but short lasting relief from [facility] joint injections earlier this year.  He presents now  for operative management after explanation of risks and benefits.    DESCRIPTION OF PROCEDURE:  The patient was brought to the operating room where general anesthetic was induced.  Preoperative IV antibiotics were administered.  Timeout was called.  The patient placed in the beach chair position with the head in neutral  position.  Right arm was examined under anesthesia and found to have full active and passive range of motion including a range of motion of 55/95/170.  Following this, the arm was prescrubbed with alcohol and Betadine and then DuraPrep solution and  draped in a sterile manner.  Charlie Pitter was used to seal the operative field and cover the axilla.  Next, a posterior portal was created 2 cm medial and posterior to medial and inferior to the posterolateral margin of the acromion.  Diagnostic arthroscopy was  performed.  The patient had intact rotator cuff.  Biceps anchor was stable.  No significant synovitis within the rotator interval.  Anterior, inferior and posterior inferior glenohumeral ligaments were intact.  Next, the scope was placed into the  subacromial space.  Anterior and lateral portals were created at this  time, anterior portal was created in line with the distal end of the clavicle.  Under arthroscopic visualization, the distal 9-10 mm of the clavicle was resected.  The superior and  posterior ligaments were maintained.  Good amount of resection and decompression of the [facility] joint space was confirmed by placing the scope through the anterior portal and then crossing the arm and no bony impingement was present.  Bursectomy performed.   Rotator cuff intact also from the bursal side.  At this time, thorough irrigation was performed.  Instruments were removed and portals were closed using 3-0 nylon.  Impervious dressing was placed.  The patient tolerated the procedure well without  immediate complications and transferred to the recovery room in stable condition.  Luke's assistance was required for limb positioning, opening and closing.  His assistance was medical necessity.   [email or web address] D: 09/20/2020 8:32:08 am T: 09/20/2020 8:50:00 am  JOB: [id number]/ [id number]

## 2020-09-22 ENCOUNTER — Ambulatory Visit (INDEPENDENT_AMBULATORY_CARE_PROVIDER_SITE_OTHER): Payer: 59 | Admitting: Orthopedic Surgery

## 2020-09-22 ENCOUNTER — Other Ambulatory Visit: Payer: Self-pay

## 2020-09-22 DIAGNOSIS — M19011 Primary osteoarthritis, right shoulder: Secondary | ICD-10-CM

## 2020-09-23 ENCOUNTER — Encounter: Payer: Self-pay | Admitting: Orthopedic Surgery

## 2020-09-23 NOTE — Progress Notes (Signed)
Post-Op Visit Note   Patient: Adam [name]           Date of Birth: 1970-05-10           MRN: [id number] Visit Date: 09/22/2020 PCP: Adam Filler, MD   Assessment & Plan:  Chief Complaint:  Chief Complaint  Patient presents with  . Right Shoulder - Routine Post Op   Visit Diagnoses:  1. Arthritis of right acromioclavicular joint     Plan: Adam [name] is a 50 year old patient who is now a week out right shoulder arthroscopy with distal clavicle excision.  In general he is doing well.  On exam he has pretty reasonable passive range of motion below shoulder level at this time.  Portal incisions are well-healed.  I am going to discontinue the sling but not have him do any lifting with that right arm.  Passive range of motion is encouraged.  He wants to try to work on this on his own in terms of getting his range of motion back.  Again no real exercising at this time except for passive range of motion.  4-week return for clinical recheck.  He is starting a new job next week.  Follow-Up Instructions: Return in about 4 weeks (around 10/20/2020).   Orders:  No orders of the defined types were placed in this encounter.  No orders of the defined types were placed in this encounter.   Imaging: No results found.  PMFS History: Patient Active Problem List   Diagnosis Date Noted  . AC (acromioclavicular) arthritis 06/09/2020  . Tendinitis of right shoulder 04/26/2020  . Acromioclavicular sprain, right, initial encounter 03/23/2020  . Nonallopathic lesion of cervical region 03/03/2020  . Nonallopathic lesion of lumbar region 03/03/2020  . Nonallopathic lesion of sacral region 03/03/2020  . Neck pain 03/03/2020  . Alcohol use disorder, in early remission (Adam [name]) 01/18/2020  . Rheumatoid arthritis ([place]) 11/10/2018  . Hearing loss 11/12/2016  . Obesity, Class III, BMI 40-49.9 (morbid obesity) ([place]) 11/12/2016  . Osteoarthritis of lumbar spine 04/09/2016  . Tendinopathy of left  rotator cuff 12/30/2015  . Obstructive sleep apnea 02/28/2015  . Type 2 diabetes mellitus with other specified complication ([place]) 18/84/1660  . Erectile dysfunction 01/06/2015  . Healthcare maintenance 01/06/2015  . Essential hypertension 01/06/2015   Past Medical History:  Diagnosis Date  . Anxiety   . Arthritis    lower back, hands  . Depression   . Deviated septum    nasal turbinate hypertrophy  . Hypertension    states under control with meds., has been on medication since age 41  . Insulin dependent diabetes mellitus    type 2 DM  . Morbid obesity ([place])   . Sleep apnea    uses CPAP "most of the time", per pt.    Family History  Problem Relation Age of Onset  . Hyperlipidemia Mother   . Aneurysm Mother   . Hypertension Mother   . Diabetes Father   . Hyperlipidemia Father   . Hypertension Father   . Healthy Daughter   . Healthy Daughter     Past Surgical History:  Procedure Laterality Date  . CARPAL TUNNEL RELEASE Right 07/2014  . LAPAROSCOPIC GASTRIC SLEEVE RESECTION     Dr. Excell [name] 09-09-17  . LAPAROSCOPIC GASTRIC SLEEVE RESECTION N/A 09/09/2017   Procedure: LAPAROSCOPIC GASTRIC SLEEVE RESECTION  AND ERAS PATHWAY;  Surgeon: Adam Seltzer, MD;  Location: [facility];  Service: General;  Laterality: N/A;  . LUMBAR FUSION    .  NASAL SEPTOPLASTY W/ TURBINOPLASTY Bilateral 03/22/2017   Procedure: NASAL SEPTOPLASTY WITH  BILATERAL INFERIOR   TURBINATE REDUCTION;  Surgeon: Adam Belfast, MD;  Location: [place];  Service: ENT;  Laterality: Bilateral;  . SHOULDER ARTHROSCOPY Left    x 2  . SHOULDER ARTHROSCOPY WITH DISTAL CLAVICLE RESECTION Right 09/16/2020   Procedure: RIGHT SHOULDER ARTHROSCOPY WITH ARTHROSCOPIC DISTAL CLAVICLE EXCISION;  Surgeon: Adam Pel, MD;  Location: [place];  Service: Orthopedics;  Laterality: Right;  . TONSILLECTOMY AND ADENOIDECTOMY    . TYMPANOSTOMY TUBE PLACEMENT Bilateral   . WISDOM TOOTH EXTRACTION     Social History    Occupational History  . Not on file  Tobacco Use  . Smoking status: Former Research scientist (life sciences)  . Smokeless tobacco: Current User    Types: Chew  Vaping Use  . Vaping Use: Never used  Substance and Sexual Activity  . Alcohol use: Not Currently  . Drug use: No  . Sexual activity: Yes

## 2020-09-26 ENCOUNTER — Other Ambulatory Visit: Payer: Self-pay | Admitting: Student in an Organized Health Care Education/Training Program

## 2020-09-26 ENCOUNTER — Other Ambulatory Visit (HOSPITAL_COMMUNITY): Payer: Self-pay

## 2020-09-26 DIAGNOSIS — M47816 Spondylosis without myelopathy or radiculopathy, lumbar region: Secondary | ICD-10-CM

## 2020-09-27 ENCOUNTER — Other Ambulatory Visit: Payer: Self-pay | Admitting: Student in an Organized Health Care Education/Training Program

## 2020-09-28 ENCOUNTER — Other Ambulatory Visit: Payer: Self-pay | Admitting: Student in an Organized Health Care Education/Training Program

## 2020-09-28 ENCOUNTER — Other Ambulatory Visit (HOSPITAL_COMMUNITY): Payer: Self-pay

## 2020-09-28 MED ORDER — HYDROCODONE-ACETAMINOPHEN 7.5-325 MG PO TABS: ORAL_TABLET | ORAL | 0 refills | Status: DC

## 2020-09-28 NOTE — Telephone Encounter (Signed)
Patient calling back for pain med. Please resend as Normal. Thank you.

## 2020-09-28 NOTE — Addendum Note (Signed)
Addended by: Velora Heckler on: 09/28/2020 01:57 PM   Modules accepted: Orders

## 2020-09-29 ENCOUNTER — Other Ambulatory Visit (HOSPITAL_COMMUNITY): Payer: Self-pay

## 2020-09-29 ENCOUNTER — Other Ambulatory Visit: Payer: Self-pay | Admitting: *Deleted

## 2020-09-29 DIAGNOSIS — M47816 Spondylosis without myelopathy or radiculopathy, lumbar region: Secondary | ICD-10-CM

## 2020-09-29 MED ORDER — SIMVASTATIN 20 MG PO TABS
ORAL_TABLET | Freq: Every day | ORAL | 0 refills | Status: DC
  Filled 2020-09-29: qty 90, 90d supply, fill #0

## 2020-09-29 MED ORDER — HYDROCODONE-ACETAMINOPHEN 7.5-325 MG PO TABS
ORAL_TABLET | ORAL | 0 refills | Status: DC
  Filled 2020-09-29: qty 45, 30d supply, fill #0

## 2020-09-29 NOTE — Telephone Encounter (Signed)
  HYDROcodone-acetaminophen (NORCO) 7.5-325 MG tablet, refill request @  [place] Phone:  [phone]  Fax:  [phone]

## 2020-09-29 NOTE — Telephone Encounter (Signed)
Hydrocodone rx was filled yesterday but as "Print". Please re-send electronically. Thanks

## 2020-09-29 NOTE — Telephone Encounter (Signed)
Pt called / informed of refill. 

## 2020-09-29 NOTE — Telephone Encounter (Signed)
Hydrocodone Rx sent

## 2020-09-29 NOTE — Telephone Encounter (Signed)
Next appt scheduled 6/13 with PCP.

## 2020-09-30 ENCOUNTER — Other Ambulatory Visit (HOSPITAL_COMMUNITY): Payer: Self-pay

## 2020-10-03 ENCOUNTER — Encounter: Payer: 59 | Admitting: Student in an Organized Health Care Education/Training Program

## 2020-10-03 ENCOUNTER — Other Ambulatory Visit (HOSPITAL_COMMUNITY): Payer: Self-pay

## 2020-10-03 DIAGNOSIS — M542 Cervicalgia: Secondary | ICD-10-CM | POA: Diagnosis not present

## 2020-10-03 DIAGNOSIS — M5459 Other low back pain: Secondary | ICD-10-CM | POA: Diagnosis not present

## 2020-10-03 DIAGNOSIS — Z79899 Other long term (current) drug therapy: Secondary | ICD-10-CM | POA: Diagnosis not present

## 2020-10-03 DIAGNOSIS — G894 Chronic pain syndrome: Secondary | ICD-10-CM | POA: Diagnosis not present

## 2020-10-03 DIAGNOSIS — M25519 Pain in unspecified shoulder: Secondary | ICD-10-CM | POA: Diagnosis not present

## 2020-10-03 DIAGNOSIS — Z79891 Long term (current) use of opiate analgesic: Secondary | ICD-10-CM | POA: Diagnosis not present

## 2020-10-03 MED ORDER — HYDROCODONE-ACETAMINOPHEN 7.5-325 MG PO TABS
1.0000 | ORAL_TABLET | Freq: Four times a day (QID) | ORAL | 0 refills | Status: DC | PRN
  Filled 2020-10-03 – 2020-10-13 (×2): qty 120, 30d supply, fill #0

## 2020-10-03 NOTE — Progress Notes (Deleted)
Adam [name] Phone: [phone] Subjective:    I'm seeing this patient by the request  of:  Axel Filler, MD  CC:   [id number]  09/06/2020 50 y.o. male with right trapezius spasm and dysfunction.  This occurs in the setting of AC DJD and right shoulder rotator cuff tendinopathy.   Patient is scheduled to have subacromial decompression and distal clavicle excision with Dr. Marlou Sa in about 3 weeks.  He has bothersome trapezius spasm and dysfunction now and wishes to have a junction to help with his pain.  Plan for injection.  Additionally proceed with conservative management strategies including heating pad TENS unit and tizanidine.  Ideally physical therapy would be used in this situation however he does not have enough time before surgery for physical therapy to do much.  If surgery gets delayed PT would be a great option.  Patient will keep me updated.  Recheck back with myself or Dr. Tamala Julian as needed.    Update 10/04/2020 Adam [name] is a 50 y.o. male coming in with complaint of R shoulder pain. Surgery on 09/16/2020.  Onset-  Location Duration-  Character- Aggravating factors- Reliving factors-  Therapies tried-  Severity-     Past Medical History:  Diagnosis Date   Anxiety    Arthritis    lower back, hands   Depression    Deviated septum    nasal turbinate hypertrophy   Hypertension    states under control with meds., has been on medication since age 60   Insulin dependent diabetes mellitus    type 2 DM   Morbid obesity ([name])    Sleep apnea    uses CPAP "most of the time", per pt.   Past Surgical History:  Procedure Laterality Date   CARPAL TUNNEL RELEASE Right 07/2014   LAPAROSCOPIC GASTRIC SLEEVE RESECTION     Dr. Excell Seltzer 09-09-17   LAPAROSCOPIC GASTRIC SLEEVE RESECTION N/A 09/09/2017   Procedure: LAPAROSCOPIC GASTRIC SLEEVE RESECTION  AND ERAS PATHWAY;  Surgeon: Excell Seltzer, MD;  Location: [facility];  Service: General;  Laterality: N/A;   LUMBAR FUSION     NASAL SEPTOPLASTY W/ TURBINOPLASTY Bilateral 03/22/2017   Procedure: NASAL SEPTOPLASTY WITH  BILATERAL INFERIOR   TURBINATE REDUCTION;  Surgeon: Jerrell Belfast, MD;  Location: [facility];  Service: ENT;  Laterality: Bilateral;   SHOULDER ARTHROSCOPY Left    x 2   SHOULDER ARTHROSCOPY WITH DISTAL CLAVICLE RESECTION Right 09/16/2020   Procedure: RIGHT SHOULDER ARTHROSCOPY WITH ARTHROSCOPIC DISTAL CLAVICLE EXCISION;  Surgeon: Meredith Pel, MD;  Location: [facility];  Service: Orthopedics;  Laterality: Right;   TONSILLECTOMY AND ADENOIDECTOMY     TYMPANOSTOMY TUBE PLACEMENT Bilateral    WISDOM TOOTH EXTRACTION     Social History   Socioeconomic History   Marital status: Married    Spouse name: Not on file   Number of children: Not on file   Years of education: Not on file   Highest education level: Not on file  Occupational History   Not on file  Tobacco Use   Smoking status: Former    Pack years: 0.00   Smokeless tobacco: Current    Types: Chew  Vaping Use   Vaping Use: Never used  Substance and Sexual Activity   Alcohol use: Not Currently   Drug use: No   Sexual activity: Yes  Other Topics Concern   Not on file  Social History Narrative   Not on file  Social Determinants of Health   Financial Resource Strain: Not on file  Food Insecurity: Not on file  Transportation Needs: Not on file  Physical Activity: Not on file  Stress: Not on file  Social Connections: Not on file   Allergies  Allergen Reactions   Chlorhexidine Itching and Rash   Family History  Problem Relation Age of Onset   Hyperlipidemia Mother    Aneurysm Mother    Hypertension Mother    Diabetes Father    Hyperlipidemia Father    Hypertension Father    Healthy Daughter    Healthy Daughter     Current Outpatient Medications (Endocrine & Metabolic):    dapagliflozin propanediol (FARXIGA) 5 MG TABS tablet,  Take 1 tablet (5 mg total) by mouth daily before breakfast.   metFORMIN (GLUCOPHAGE) 1000 MG tablet, TAKE 1 TABLET BY MOUTH TWICE DAILY WITH A MEAL. (Patient taking differently: Take 1,000 mg by mouth 2 (two) times daily with a meal.)   pioglitazone (ACTOS) 45 MG tablet, TAKE 1 TABLET BY MOUTH DAILY. (Patient taking differently: Take 45 mg by mouth daily.)  Current Outpatient Medications (Cardiovascular):    amLODipine (NORVASC) 5 MG tablet, TAKE 1 TABLET BY MOUTH ONCE A DAY (Patient taking differently: Take 5 mg by mouth daily.)   hydrochlorothiazide (HYDRODIURIL) 25 MG tablet, TAKE 1 TABLET BY MOUTH ONCE A DAY (Patient taking differently: Take 25 mg by mouth daily.)   losartan (COZAAR) 100 MG tablet, TAKE 1 TABLET BY MOUTH DAILY. (Patient taking differently: Take 100 mg by mouth daily.)   simvastatin (ZOCOR) 20 MG tablet, TAKE 1 TABLET BY MOUTH ONCE DAILY   tadalafil (CIALIS) 20 MG tablet, TAKE 1 TABLET BY MOUTH DAILY AS NEEDED FOR ERECTILE DYSFUNCTION. (Patient taking differently: Take 20 mg by mouth daily as needed for erectile dysfunction.)  Current Outpatient Medications (Respiratory):    loratadine (CLARITIN) 10 MG tablet, Take 10 mg by mouth daily.  Current Outpatient Medications (Analgesics):    aspirin EC 81 MG tablet, Take 81 mg by mouth daily.   HYDROcodone-acetaminophen (NORCO) 7.5-325 MG tablet, TAKE 1 TABLET BY MOUTH EVERY 12 HOURS AS NEEDED FOR MODERATE PAIN. THIS IS A 30 DAY SUPPLY.   HYDROcodone-acetaminophen (NORCO) 7.5-325 MG tablet, Take 1 tablet by mouth 4 (four) times daily as needed.   ketorolac (TORADOL) 10 MG tablet, Take 1 tablet (10 mg total) by mouth every 8 (eight) hours as needed.   oxyCODONE-acetaminophen (PERCOCET) 5-325 MG tablet, Take 1 tablet by mouth every 4 (four) hours as needed for severe pain.   Current Outpatient Medications (Other):    cholecalciferol (VITAMIN D3) 25 MCG (1000 UNIT) tablet, Take 1,000 Units by mouth daily.   DULoxetine (CYMBALTA) 60  MG capsule, TAKE 1 CAPSULE BY MOUTH ONCE A DAY (Patient taking differently: Take 60 mg by mouth daily.)   hydroxychloroquine (PLAQUENIL) 200 MG tablet, Take 1 tablet (200 mg total) by mouth 2 (two) times daily.   tiZANidine (ZANAFLEX) 4 MG tablet, Take 1 tablet (4 mg total) by mouth every 8 (eight) hours as needed for muscle spasms. (Patient taking differently: Take 4 mg by mouth at bedtime.)   Reviewed prior external information including notes and imaging from  primary care provider As well as notes that were available from care everywhere and other healthcare systems.  Past medical history, social, surgical and family history all reviewed in electronic medical record.  No pertanent information unless stated regarding to the chief complaint.   Review of Systems:  No headache, visual changes,  nausea, vomiting, diarrhea, constipation, dizziness, abdominal pain, skin rash, fevers, chills, night sweats, weight loss, swollen lymph nodes, body aches, joint swelling, chest pain, shortness of breath, mood changes. POSITIVE muscle aches  Objective  There were no vitals taken for this visit.   General: No apparent distress alert and oriented x3 mood and affect normal, dressed appropriately.  HEENT: Pupils equal, extraocular movements intact  Respiratory: Patient's speak in full sentences and does not appear short of breath  Cardiovascular: No lower extremity edema, non tender, no erythema  Gait normal with good balance and coordination.  MSK:  Non tender with full range of motion and good stability and symmetric strength and tone of shoulders, elbows, wrist, hip, knee and ankles bilaterally.     Impression and Recommendations:     The above documentation has been reviewed and is accurate and complete Jacqualin Combes

## 2020-10-04 ENCOUNTER — Ambulatory Visit: Payer: 59 | Admitting: Family Medicine

## 2020-10-12 ENCOUNTER — Other Ambulatory Visit (HOSPITAL_COMMUNITY): Payer: Self-pay

## 2020-10-12 MED FILL — Tadalafil Tab 20 MG: ORAL | 24 days supply | Qty: 24 | Fill #2 | Status: AC

## 2020-10-12 MED FILL — Tadalafil Tab 20 MG: ORAL | 30 days supply | Qty: 6 | Fill #1 | Status: AC

## 2020-10-12 MED FILL — Tadalafil Tab 20 MG: ORAL | 24 days supply | Qty: 24 | Fill #2 | Status: CN

## 2020-10-13 ENCOUNTER — Other Ambulatory Visit (HOSPITAL_COMMUNITY): Payer: Self-pay

## 2020-10-17 ENCOUNTER — Encounter: Payer: 59 | Admitting: Orthopedic Surgery

## 2020-10-19 ENCOUNTER — Other Ambulatory Visit (HOSPITAL_COMMUNITY): Payer: Self-pay

## 2020-10-24 ENCOUNTER — Other Ambulatory Visit: Payer: Self-pay | Admitting: Student in an Organized Health Care Education/Training Program

## 2020-10-24 DIAGNOSIS — I10 Essential (primary) hypertension: Secondary | ICD-10-CM

## 2020-10-24 MED FILL — Duloxetine HCl Enteric Coated Pellets Cap 60 MG (Base Eq): ORAL | 90 days supply | Qty: 90 | Fill #1 | Status: AC

## 2020-10-25 ENCOUNTER — Other Ambulatory Visit (HOSPITAL_COMMUNITY): Payer: Self-pay

## 2020-10-25 ENCOUNTER — Encounter: Payer: Self-pay | Admitting: *Deleted

## 2020-10-25 ENCOUNTER — Other Ambulatory Visit: Payer: Self-pay | Admitting: Student in an Organized Health Care Education/Training Program

## 2020-10-26 ENCOUNTER — Other Ambulatory Visit (HOSPITAL_COMMUNITY): Payer: Self-pay

## 2020-10-27 ENCOUNTER — Other Ambulatory Visit (HOSPITAL_COMMUNITY): Payer: Self-pay

## 2020-10-27 MED ORDER — METFORMIN HCL 1000 MG PO TABS
ORAL_TABLET | Freq: Two times a day (BID) | ORAL | 3 refills | Status: DC
  Filled 2020-10-27: qty 180, 90d supply, fill #0
  Filled 2021-01-23: qty 180, 90d supply, fill #1
  Filled 2021-04-24: qty 180, 90d supply, fill #2
  Filled 2021-07-17: qty 180, 90d supply, fill #3

## 2020-10-27 MED ORDER — HYDROCHLOROTHIAZIDE 25 MG PO TABS
ORAL_TABLET | Freq: Every day | ORAL | 2 refills | Status: DC
  Filled 2020-10-27: qty 90, 90d supply, fill #0
  Filled 2021-01-21: qty 90, 90d supply, fill #1
  Filled 2021-04-21: qty 90, 90d supply, fill #2

## 2020-10-27 MED ORDER — AMLODIPINE BESYLATE 5 MG PO TABS
ORAL_TABLET | Freq: Every day | ORAL | 2 refills | Status: DC
  Filled 2020-10-27: qty 90, 90d supply, fill #0
  Filled 2021-01-21: qty 90, 90d supply, fill #1
  Filled 2021-04-21: qty 90, 90d supply, fill #2

## 2020-10-28 DIAGNOSIS — M9903 Segmental and somatic dysfunction of lumbar region: Secondary | ICD-10-CM | POA: Diagnosis not present

## 2020-10-28 DIAGNOSIS — M9902 Segmental and somatic dysfunction of thoracic region: Secondary | ICD-10-CM | POA: Diagnosis not present

## 2020-10-28 DIAGNOSIS — M25511 Pain in right shoulder: Secondary | ICD-10-CM | POA: Diagnosis not present

## 2020-10-28 DIAGNOSIS — M9901 Segmental and somatic dysfunction of cervical region: Secondary | ICD-10-CM | POA: Diagnosis not present

## 2020-10-31 DIAGNOSIS — M9903 Segmental and somatic dysfunction of lumbar region: Secondary | ICD-10-CM | POA: Diagnosis not present

## 2020-10-31 DIAGNOSIS — M9902 Segmental and somatic dysfunction of thoracic region: Secondary | ICD-10-CM | POA: Diagnosis not present

## 2020-10-31 DIAGNOSIS — M25511 Pain in right shoulder: Secondary | ICD-10-CM | POA: Diagnosis not present

## 2020-10-31 DIAGNOSIS — M9901 Segmental and somatic dysfunction of cervical region: Secondary | ICD-10-CM | POA: Diagnosis not present

## 2020-11-02 DIAGNOSIS — M9903 Segmental and somatic dysfunction of lumbar region: Secondary | ICD-10-CM | POA: Diagnosis not present

## 2020-11-02 DIAGNOSIS — M9902 Segmental and somatic dysfunction of thoracic region: Secondary | ICD-10-CM | POA: Diagnosis not present

## 2020-11-02 DIAGNOSIS — M25511 Pain in right shoulder: Secondary | ICD-10-CM | POA: Diagnosis not present

## 2020-11-02 DIAGNOSIS — M9901 Segmental and somatic dysfunction of cervical region: Secondary | ICD-10-CM | POA: Diagnosis not present

## 2020-11-04 DIAGNOSIS — M9901 Segmental and somatic dysfunction of cervical region: Secondary | ICD-10-CM | POA: Diagnosis not present

## 2020-11-04 DIAGNOSIS — M9903 Segmental and somatic dysfunction of lumbar region: Secondary | ICD-10-CM | POA: Diagnosis not present

## 2020-11-04 DIAGNOSIS — M9902 Segmental and somatic dysfunction of thoracic region: Secondary | ICD-10-CM | POA: Diagnosis not present

## 2020-11-04 DIAGNOSIS — M25511 Pain in right shoulder: Secondary | ICD-10-CM | POA: Diagnosis not present

## 2020-11-07 ENCOUNTER — Encounter: Payer: 59 | Admitting: Student in an Organized Health Care Education/Training Program

## 2020-11-07 DIAGNOSIS — M25511 Pain in right shoulder: Secondary | ICD-10-CM | POA: Diagnosis not present

## 2020-11-07 DIAGNOSIS — M9901 Segmental and somatic dysfunction of cervical region: Secondary | ICD-10-CM | POA: Diagnosis not present

## 2020-11-07 DIAGNOSIS — M9902 Segmental and somatic dysfunction of thoracic region: Secondary | ICD-10-CM | POA: Diagnosis not present

## 2020-11-07 DIAGNOSIS — M9903 Segmental and somatic dysfunction of lumbar region: Secondary | ICD-10-CM | POA: Diagnosis not present

## 2020-11-09 ENCOUNTER — Other Ambulatory Visit (HOSPITAL_COMMUNITY): Payer: Self-pay

## 2020-11-09 DIAGNOSIS — Z79891 Long term (current) use of opiate analgesic: Secondary | ICD-10-CM | POA: Diagnosis not present

## 2020-11-09 DIAGNOSIS — G894 Chronic pain syndrome: Secondary | ICD-10-CM | POA: Diagnosis not present

## 2020-11-09 DIAGNOSIS — M7060 Trochanteric bursitis, unspecified hip: Secondary | ICD-10-CM | POA: Diagnosis not present

## 2020-11-09 DIAGNOSIS — Z79899 Other long term (current) drug therapy: Secondary | ICD-10-CM | POA: Diagnosis not present

## 2020-11-09 DIAGNOSIS — M25519 Pain in unspecified shoulder: Secondary | ICD-10-CM | POA: Diagnosis not present

## 2020-11-09 DIAGNOSIS — M5459 Other low back pain: Secondary | ICD-10-CM | POA: Diagnosis not present

## 2020-11-09 MED ORDER — HYDROCODONE-ACETAMINOPHEN 10-325 MG PO TABS
1.0000 | ORAL_TABLET | Freq: Four times a day (QID) | ORAL | 0 refills | Status: DC | PRN
  Filled 2020-11-09 (×2): qty 120, 30d supply, fill #0

## 2020-11-11 ENCOUNTER — Ambulatory Visit: Payer: Self-pay

## 2020-11-11 ENCOUNTER — Ambulatory Visit (INDEPENDENT_AMBULATORY_CARE_PROVIDER_SITE_OTHER): Payer: 59 | Admitting: Family Medicine

## 2020-11-11 ENCOUNTER — Encounter: Payer: Self-pay | Admitting: Family Medicine

## 2020-11-11 ENCOUNTER — Other Ambulatory Visit (HOSPITAL_COMMUNITY): Payer: Self-pay

## 2020-11-11 ENCOUNTER — Other Ambulatory Visit: Payer: Self-pay

## 2020-11-11 VITALS — BP 142/88 | HR 68 | Ht 71.5 in | Wt 225.6 lb

## 2020-11-11 DIAGNOSIS — M25551 Pain in right hip: Secondary | ICD-10-CM

## 2020-11-11 DIAGNOSIS — M7062 Trochanteric bursitis, left hip: Secondary | ICD-10-CM | POA: Diagnosis not present

## 2020-11-11 DIAGNOSIS — M25552 Pain in left hip: Secondary | ICD-10-CM | POA: Diagnosis not present

## 2020-11-11 DIAGNOSIS — M7061 Trochanteric bursitis, right hip: Secondary | ICD-10-CM | POA: Diagnosis not present

## 2020-11-11 MED FILL — Tadalafil Tab 20 MG: ORAL | 30 days supply | Qty: 30 | Fill #3 | Status: AC

## 2020-11-11 NOTE — Progress Notes (Signed)
I, Adam [name], LAT, ATC acting as a scribe for Adam Leader, MD.  Adam [name] is a 50 y.o. male who presents to [facility] today for bilat hip pain ongoing for quite awhile, worsening over the past couple months.  Pt was last seen by Dr. Georgina Snell on 09/06/20 and was given a R trapezius insertion superior medial scapular corner steroid injection and had subacromial decompression and distal clavicle excision with Dr. Marlou Sa on 09/16/20. Today, pt locates pain to the lateral aspect of B hips. 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  He is working in the [place] area now at least temporarily and if needing physical therapy would like it done in the [facility] area.  Low back pain: no- L5-S1 fusion Radiates: no LE Numbness/tingling: no LE Weakness: no Aggravates: sleeping (side-lying),  Treatments tried: steroid injection. chiro  Dx imaging: 03/03/20 L hip & L-spine XR  12/06/16 bilat hip/pelvis XR  Pertinent review of systems: No fevers or chills  Relevant historical information: Rheumatoid arthritis   Exam:  BP (!) 142/88   Pulse 68   Ht 5' 11.5" (1.816 m)   Wt 225 lb 9.6 oz (102.3 kg)   SpO2 96%   BMI 31.03 kg/m  General: Well Developed, well nourished, and in no acute distress.   MSK: Right hip normal-appearing Normal motion. Tender palpation right lateral hip. Hip abduction strength diminished 4/5.  External rotation strength intact.  Left hip normal-appearing Normal motion. Tender palpation left lateral hip. Hip abduction strength diminished 4/5.  External rotation strength intact.    Lab and Radiology Results  Procedure: Real-time Ultrasound Guided Injection of left hip greater trochanter bursa Device: Philips Affiniti 50G Images permanently stored and available for review in PACS Verbal informed consent obtained.  Discussed risks and benefits of procedure. Warned about infection bleeding damage to  structures skin hypopigmentation and fat atrophy among others. Patient expresses understanding and agreement Time-out conducted.   Noted no overlying erythema, induration, or other signs of local infection.   Skin prepped in a sterile fashion.   Local anesthesia: Topical Ethyl chloride.   With sterile technique and under real time ultrasound guidance: 40 mg of Kenalog and 2 mL of Marcaine injected into the greater trochanter bursa. Fluid seen entering the bursa.   Completed without difficulty   Pain immediately resolved suggesting accurate placement of the medication.   Advised to call if fevers/chills, erythema, induration, drainage, or persistent bleeding.   Images permanently stored and available for review in the ultrasound unit.  Impression: Technically successful ultrasound guided injection.    Procedure: Real-time Ultrasound Guided Injection of right hip greater trochanter bursa Device: Philips Affiniti 50G Images permanently stored and available for review in PACS Verbal informed consent obtained.  Discussed risks and benefits of procedure. Warned about infection bleeding damage to structures skin hypopigmentation and fat atrophy among others. Patient expresses understanding and agreement Time-out conducted.   Noted no overlying erythema, induration, or other signs of local infection.   Skin prepped in a sterile fashion.   Local anesthesia: Topical Ethyl chloride.   With sterile technique and under real time ultrasound guidance: 40 mg of Kenalog and 2 mL of Marcaine injected into greater trochanter bursa. Fluid seen entering the bursa.   Completed without difficulty   Pain immediately resolved suggesting accurate placement of the medication.   Advised to call if fevers/chills, erythema, induration, drainage, or persistent bleeding.   Images  permanently stored and available for review in the ultrasound unit.  Impression: Technically successful ultrasound guided  injection.        Assessment and Plan: 50 y.o. male with bilateral lateral hip pain chronically ongoing for about 4-6 months.  Patient has significant hip abduction weakness which is probably the fundamental cause.  His rheumatoid arthritis may be playing a factor here as well.  Plan for steroid injection bilaterally and referral to physical therapy.  Because he is now working in the [facility] area will refer to a physical therapy location of there.   Return as needed.   PDMP not reviewed this encounter. Orders Placed This Encounter  Procedures   [place] LIMITED JOINT SPACE STRUCTURES LOW BILAT(NO LINKED CHARGES)    Standing Status:   Future    Number of Occurrences:   1    Standing Expiration Date:   05/14/2021    Order Specific Question:   Reason for Exam (SYMPTOM  OR DIAGNOSIS REQUIRED)    Answer:   bilateral hip pain    Order Specific Question:   Preferred imaging location?    Answer:   [place]   Ambulatory referral to Physical Therapy    Referral Priority:   Routine    Referral Type:   Physical Medicine    Referral Reason:   Specialty Services Required    Requested Specialty:   Physical Therapy    Number of Visits Requested:   1   No orders of the defined types were placed in this encounter.    Discussed warning signs or symptoms. Please see discharge instructions. Patient expresses understanding.   The above documentation has been reviewed and is accurate and complete Adam [name], M.D.

## 2020-11-11 NOTE — Patient Instructions (Signed)
Thank you for coming in today.   Call or go to the ER if you develop a large red swollen joint with extreme pain or oozing puss.    I've referred you to Physical Therapy.  Let us know if you don't hear from them in one week.   Let me know if you have a problem   If you want to go to a different Pt location let me know who they are.

## 2020-11-14 DIAGNOSIS — M9901 Segmental and somatic dysfunction of cervical region: Secondary | ICD-10-CM | POA: Diagnosis not present

## 2020-11-14 DIAGNOSIS — M25511 Pain in right shoulder: Secondary | ICD-10-CM | POA: Diagnosis not present

## 2020-11-14 DIAGNOSIS — M9902 Segmental and somatic dysfunction of thoracic region: Secondary | ICD-10-CM | POA: Diagnosis not present

## 2020-11-14 DIAGNOSIS — M9903 Segmental and somatic dysfunction of lumbar region: Secondary | ICD-10-CM | POA: Diagnosis not present

## 2020-11-16 DIAGNOSIS — M25511 Pain in right shoulder: Secondary | ICD-10-CM | POA: Diagnosis not present

## 2020-11-16 DIAGNOSIS — M9903 Segmental and somatic dysfunction of lumbar region: Secondary | ICD-10-CM | POA: Diagnosis not present

## 2020-11-16 DIAGNOSIS — M9901 Segmental and somatic dysfunction of cervical region: Secondary | ICD-10-CM | POA: Diagnosis not present

## 2020-11-16 DIAGNOSIS — M9902 Segmental and somatic dysfunction of thoracic region: Secondary | ICD-10-CM | POA: Diagnosis not present

## 2020-11-21 DIAGNOSIS — M9901 Segmental and somatic dysfunction of cervical region: Secondary | ICD-10-CM | POA: Diagnosis not present

## 2020-11-21 DIAGNOSIS — M9902 Segmental and somatic dysfunction of thoracic region: Secondary | ICD-10-CM | POA: Diagnosis not present

## 2020-11-21 DIAGNOSIS — M9903 Segmental and somatic dysfunction of lumbar region: Secondary | ICD-10-CM | POA: Diagnosis not present

## 2020-11-21 DIAGNOSIS — M25511 Pain in right shoulder: Secondary | ICD-10-CM | POA: Diagnosis not present

## 2020-11-23 DIAGNOSIS — M25552 Pain in left hip: Secondary | ICD-10-CM | POA: Diagnosis not present

## 2020-11-23 DIAGNOSIS — M25651 Stiffness of right hip, not elsewhere classified: Secondary | ICD-10-CM | POA: Diagnosis not present

## 2020-11-23 DIAGNOSIS — M25652 Stiffness of left hip, not elsewhere classified: Secondary | ICD-10-CM | POA: Diagnosis not present

## 2020-11-23 DIAGNOSIS — M25551 Pain in right hip: Secondary | ICD-10-CM | POA: Diagnosis not present

## 2020-11-25 ENCOUNTER — Other Ambulatory Visit (HOSPITAL_COMMUNITY): Payer: Self-pay

## 2020-11-28 ENCOUNTER — Ambulatory Visit: Payer: 59 | Admitting: Family Medicine

## 2020-11-30 DIAGNOSIS — M9902 Segmental and somatic dysfunction of thoracic region: Secondary | ICD-10-CM | POA: Diagnosis not present

## 2020-11-30 DIAGNOSIS — M9901 Segmental and somatic dysfunction of cervical region: Secondary | ICD-10-CM | POA: Diagnosis not present

## 2020-11-30 DIAGNOSIS — M25511 Pain in right shoulder: Secondary | ICD-10-CM | POA: Diagnosis not present

## 2020-11-30 DIAGNOSIS — M9903 Segmental and somatic dysfunction of lumbar region: Secondary | ICD-10-CM | POA: Diagnosis not present

## 2020-12-05 DIAGNOSIS — M25511 Pain in right shoulder: Secondary | ICD-10-CM | POA: Diagnosis not present

## 2020-12-05 DIAGNOSIS — M9902 Segmental and somatic dysfunction of thoracic region: Secondary | ICD-10-CM | POA: Diagnosis not present

## 2020-12-05 DIAGNOSIS — M9901 Segmental and somatic dysfunction of cervical region: Secondary | ICD-10-CM | POA: Diagnosis not present

## 2020-12-05 DIAGNOSIS — M9903 Segmental and somatic dysfunction of lumbar region: Secondary | ICD-10-CM | POA: Diagnosis not present

## 2020-12-06 NOTE — Progress Notes (Deleted)
Office Visit Note  Patient: Adam [name]             Date of Birth: 30-Apr-1970           MRN: [id number]             PCP: Axel Filler, MD Referring: Axel Filler Visit Date: 12/20/2020 Occupation: '@GUAROCC'$ @  Subjective:  No chief complaint on file.   History of Present Illness: Adam [name] is a 50 y.o. male ***   Activities of Daily Living:  Patient reports morning stiffness for *** {minute/hour:19697}.   Patient {ACTIONS;DENIES/REPORTS:21021675::"Denies"} nocturnal pain.  Difficulty dressing/grooming: {ACTIONS;DENIES/REPORTS:21021675::"Denies"} Difficulty climbing stairs: {ACTIONS;DENIES/REPORTS:21021675::"Denies"} Difficulty getting out of chair: {ACTIONS;DENIES/REPORTS:21021675::"Denies"} Difficulty using hands for taps, buttons, cutlery, and/or writing: {ACTIONS;DENIES/REPORTS:21021675::"Denies"}  No Rheumatology ROS completed.   PMFS History:  Patient Active Problem List   Diagnosis Date Noted   AC (acromioclavicular) arthritis 06/09/2020   Tendinitis of right shoulder 04/26/2020   Acromioclavicular sprain, right, initial encounter 03/23/2020   Nonallopathic lesion of cervical region 03/03/2020   Nonallopathic lesion of lumbar region 03/03/2020   Nonallopathic lesion of sacral region 03/03/2020   Neck pain 03/03/2020   Alcohol use disorder, in early remission ([place]) 01/18/2020   Rheumatoid arthritis ([place]) 11/10/2018   Hearing loss 11/12/2016   Obesity, Class III, BMI 40-49.9 (morbid obesity) ([place]) 11/12/2016   Osteoarthritis of lumbar spine 04/09/2016   Tendinopathy of left rotator cuff 12/30/2015   Obstructive sleep apnea 02/28/2015   Type 2 diabetes mellitus with other specified complication ([place]) 0000000   Erectile dysfunction 01/06/2015   Healthcare maintenance 01/06/2015   Essential hypertension 01/06/2015    Past Medical History:  Diagnosis Date   Anxiety    Arthritis    lower back, hands   Depression    Deviated  septum    nasal turbinate hypertrophy   Hypertension    states under control with meds., has been on medication since age 11   Insulin dependent diabetes mellitus    type 2 DM   Morbid obesity ([place])    Sleep apnea    uses CPAP "most of the time", per pt.    Family History  Problem Relation Age of Onset   Hyperlipidemia Mother    Aneurysm Mother    Hypertension Mother    Diabetes Father    Hyperlipidemia Father    Hypertension Father    Healthy Daughter    Healthy Daughter    Past Surgical History:  Procedure Laterality Date   CARPAL TUNNEL RELEASE Right 07/2014   LAPAROSCOPIC GASTRIC SLEEVE RESECTION     Dr. Excell Seltzer 09-09-17   LAPAROSCOPIC GASTRIC SLEEVE RESECTION N/A 09/09/2017   Procedure: LAPAROSCOPIC GASTRIC SLEEVE RESECTION  AND ERAS PATHWAY;  Surgeon: Excell Seltzer, MD;  Location: [facility];  Service: General;  Laterality: N/A;   LUMBAR FUSION     NASAL SEPTOPLASTY W/ TURBINOPLASTY Bilateral 03/22/2017   Procedure: NASAL SEPTOPLASTY WITH  BILATERAL INFERIOR   TURBINATE REDUCTION;  Surgeon: Jerrell Belfast, MD;  Location: [facility];  Service: ENT;  Laterality: Bilateral;   SHOULDER ARTHROSCOPY Left    x 2   SHOULDER ARTHROSCOPY WITH DISTAL CLAVICLE RESECTION Right 09/16/2020   Procedure: RIGHT SHOULDER ARTHROSCOPY WITH ARTHROSCOPIC DISTAL CLAVICLE EXCISION;  Surgeon: Meredith Pel, MD;  Location: [facility];  Service: Orthopedics;  Laterality: Right;   TONSILLECTOMY AND ADENOIDECTOMY     TYMPANOSTOMY TUBE PLACEMENT Bilateral    WISDOM TOOTH EXTRACTION     Social History   Social History Narrative  Not on file   Immunization History  Administered Date(s) Administered   Influenza Inj Mdck Quad Pf 01/10/2018   Influenza,inj,Quad PF,6+ Mos 01/06/2015, 01/07/2017, 01/22/2019, 02/01/2020   Influenza-Unspecified 03/10/2016   PFIZER(Purple Top)SARS-COV-2 Vaccination 07/04/2019, 07/28/2019, 07/05/2020   Pneumococcal Polysaccharide-23 01/06/2015   Td 07/11/2020   Tdap  12/09/2010     Objective: Vital Signs: There were no vitals taken for this visit.   Physical Exam   Musculoskeletal Exam: ***  CDAI Exam: CDAI Score: -- Patient Global: --; Provider Global: -- Swollen: --; Tender: -- Joint Exam 12/20/2020   No joint exam has been documented for this visit   There is currently no information documented on the homunculus. Go to the Rheumatology activity and complete the homunculus joint exam.  Investigation: No additional findings.  Imaging: [place] LIMITED JOINT SPACE STRUCTURES LOW BILAT(NO LINKED CHARGES)  Result Date: 12/01/2020 Procedure: Real-time Ultrasound Guided Injection of left hip greater trochanter bursa Device: Philips Affiniti 50G Images permanently stored and available for review in PACS Verbal informed consent obtained.  Discussed risks and benefits of procedure. Warned about infection bleeding damage to structures skin hypopigmentation and fat atrophy among others. Patient expresses understanding and agreement Time-out conducted.   Noted no overlying erythema, induration, or other signs of local infection.   Skin prepped in a sterile fashion.   Local anesthesia: Topical Ethyl chloride.   With sterile technique and under real time ultrasound guidance: 40 mg of Kenalog and 2 mL of Marcaine injected into the greater trochanter bursa. Fluid seen entering the bursa.   Completed without difficulty   Pain immediately resolved suggesting accurate placement of the medication.   Advised to call if fevers/chills, erythema, induration, drainage, or persistent bleeding.   Images permanently stored and available for review in the ultrasound unit. Impression: Technically successful ultrasound guided injection.       Procedure: Real-time Ultrasound Guided Injection of right hip greater trochanter bursa Device: Philips Affiniti 50G Images permanently stored and available for review in PACS Verbal informed consent obtained.  Discussed risks and benefits of  procedure. Warned about infection bleeding damage to structures skin hypopigmentation and fat atrophy among others. Patient expresses understanding and agreement Time-out conducted.   Noted no overlying erythema, induration, or other signs of local infection.   Skin prepped in a sterile fashion.   Local anesthesia: Topical Ethyl chloride.   With sterile technique and under real time ultrasound guidance: 40 mg of Kenalog and 2 mL of Marcaine injected into greater trochanter bursa. Fluid seen entering the bursa.   Completed without difficulty   Pain immediately resolved suggesting accurate placement of the medication.   Advised to call if fevers/chills, erythema, induration, drainage, or persistent bleeding.   Images permanently stored and available for review in the ultrasound unit. Impression: Technically successful ultrasound guided injection.   Recent Labs: Lab Results  Component Value Date   WBC 8.6 09/13/2020   HGB 15.7 09/13/2020   PLT 250 09/13/2020   NA 138 09/13/2020   K 3.8 09/13/2020   CL 101 09/13/2020   CO2 26 09/13/2020   GLUCOSE 258 (H) 09/13/2020   BUN 13 09/13/2020   CREATININE 0.94 09/13/2020   BILITOT 0.7 07/26/2020   ALKPHOS 95 01/18/2020   AST 19 07/26/2020   ALT 31 07/26/2020   PROT 7.1 07/26/2020   ALBUMIN 4.7 01/18/2020   CALCIUM 9.7 09/13/2020   GFRAA 104 07/26/2020   QFTBGOLDPLUS NEGATIVE 12/25/2018    Speciality Comments: No specialty comments available.  Procedures:  No procedures performed Allergies: Chlorhexidine   Assessment / Plan:     Visit Diagnoses: No diagnosis found.  Orders: No orders of the defined types were placed in this encounter.  No orders of the defined types were placed in this encounter.   Face-to-face time spent with patient was *** minutes. Greater than 50% of time was spent in counseling and coordination of care.  Follow-Up Instructions: No follow-ups on file.   Earnestine Mealing, CMA  Note - This record has been created  using Editor, commissioning.  Chart creation errors have been sought, but may not always  have been located. Such creation errors do not reflect on  the standard of medical care.

## 2020-12-09 ENCOUNTER — Other Ambulatory Visit (HOSPITAL_COMMUNITY): Payer: Self-pay

## 2020-12-09 DIAGNOSIS — G894 Chronic pain syndrome: Secondary | ICD-10-CM | POA: Diagnosis not present

## 2020-12-09 DIAGNOSIS — M25519 Pain in unspecified shoulder: Secondary | ICD-10-CM | POA: Diagnosis not present

## 2020-12-09 DIAGNOSIS — M5459 Other low back pain: Secondary | ICD-10-CM | POA: Diagnosis not present

## 2020-12-09 DIAGNOSIS — M7060 Trochanteric bursitis, unspecified hip: Secondary | ICD-10-CM | POA: Diagnosis not present

## 2020-12-09 MED ORDER — HYDROCODONE-ACETAMINOPHEN 10-325 MG PO TABS
1.0000 | ORAL_TABLET | Freq: Four times a day (QID) | ORAL | 0 refills | Status: DC
  Filled 2020-12-09: qty 120, 30d supply, fill #0

## 2020-12-20 ENCOUNTER — Ambulatory Visit: Payer: 59 | Admitting: Physician Assistant

## 2020-12-20 DIAGNOSIS — E1169 Type 2 diabetes mellitus with other specified complication: Secondary | ICD-10-CM

## 2020-12-20 DIAGNOSIS — Z79899 Other long term (current) drug therapy: Secondary | ICD-10-CM

## 2020-12-20 DIAGNOSIS — H9193 Unspecified hearing loss, bilateral: Secondary | ICD-10-CM

## 2020-12-20 DIAGNOSIS — M5136 Other intervertebral disc degeneration, lumbar region: Secondary | ICD-10-CM

## 2020-12-20 DIAGNOSIS — G4733 Obstructive sleep apnea (adult) (pediatric): Secondary | ICD-10-CM

## 2020-12-20 DIAGNOSIS — M19011 Primary osteoarthritis, right shoulder: Secondary | ICD-10-CM

## 2020-12-20 DIAGNOSIS — E559 Vitamin D deficiency, unspecified: Secondary | ICD-10-CM

## 2020-12-20 DIAGNOSIS — I10 Essential (primary) hypertension: Secondary | ICD-10-CM

## 2020-12-20 DIAGNOSIS — M67912 Unspecified disorder of synovium and tendon, left shoulder: Secondary | ICD-10-CM

## 2020-12-20 DIAGNOSIS — M7062 Trochanteric bursitis, left hip: Secondary | ICD-10-CM

## 2020-12-20 DIAGNOSIS — M2241 Chondromalacia patellae, right knee: Secondary | ICD-10-CM

## 2020-12-20 DIAGNOSIS — R5383 Other fatigue: Secondary | ICD-10-CM

## 2020-12-20 DIAGNOSIS — M0579 Rheumatoid arthritis with rheumatoid factor of multiple sites without organ or systems involvement: Secondary | ICD-10-CM

## 2020-12-21 ENCOUNTER — Other Ambulatory Visit: Payer: Self-pay | Admitting: Student in an Organized Health Care Education/Training Program

## 2020-12-21 ENCOUNTER — Other Ambulatory Visit (HOSPITAL_COMMUNITY): Payer: Self-pay

## 2020-12-21 MED ORDER — SIMVASTATIN 20 MG PO TABS
ORAL_TABLET | Freq: Every day | ORAL | 3 refills | Status: DC
  Filled 2020-12-21: qty 90, 90d supply, fill #0
  Filled 2021-03-21: qty 90, 90d supply, fill #1
  Filled 2021-06-19: qty 90, 90d supply, fill #2
  Filled 2021-08-12 – 2021-09-11 (×2): qty 90, 90d supply, fill #3

## 2020-12-28 ENCOUNTER — Other Ambulatory Visit (HOSPITAL_COMMUNITY): Payer: Self-pay

## 2020-12-28 ENCOUNTER — Other Ambulatory Visit: Payer: Self-pay

## 2020-12-30 ENCOUNTER — Other Ambulatory Visit (HOSPITAL_COMMUNITY): Payer: Self-pay

## 2020-12-31 ENCOUNTER — Other Ambulatory Visit: Payer: Self-pay | Admitting: Student

## 2020-12-31 ENCOUNTER — Other Ambulatory Visit (HOSPITAL_COMMUNITY): Payer: Self-pay

## 2020-12-31 DIAGNOSIS — N529 Male erectile dysfunction, unspecified: Secondary | ICD-10-CM

## 2021-01-02 ENCOUNTER — Other Ambulatory Visit (HOSPITAL_COMMUNITY): Payer: Self-pay

## 2021-01-04 ENCOUNTER — Other Ambulatory Visit (HOSPITAL_COMMUNITY): Payer: Self-pay

## 2021-01-04 MED ORDER — TADALAFIL 20 MG PO TABS
ORAL_TABLET | ORAL | 2 refills | Status: DC
  Filled 2021-03-29: qty 30, 30d supply, fill #0
  Filled 2021-08-12: qty 30, 30d supply, fill #1
  Filled 2021-09-18: qty 30, 30d supply, fill #2
  Filled 2021-09-19 – 2021-09-29 (×2): qty 30, 30d supply, fill #0

## 2021-01-06 ENCOUNTER — Other Ambulatory Visit (HOSPITAL_COMMUNITY): Payer: Self-pay

## 2021-01-06 DIAGNOSIS — M5459 Other low back pain: Secondary | ICD-10-CM | POA: Diagnosis not present

## 2021-01-06 DIAGNOSIS — M25519 Pain in unspecified shoulder: Secondary | ICD-10-CM | POA: Diagnosis not present

## 2021-01-06 DIAGNOSIS — M7060 Trochanteric bursitis, unspecified hip: Secondary | ICD-10-CM | POA: Diagnosis not present

## 2021-01-06 DIAGNOSIS — G894 Chronic pain syndrome: Secondary | ICD-10-CM | POA: Diagnosis not present

## 2021-01-06 MED ORDER — HYDROCODONE-ACETAMINOPHEN 10-325 MG PO TABS
1.0000 | ORAL_TABLET | ORAL | 0 refills | Status: DC
  Filled 2021-01-06: qty 150, 30d supply, fill #0

## 2021-01-17 ENCOUNTER — Other Ambulatory Visit (HOSPITAL_COMMUNITY): Payer: Self-pay

## 2021-01-21 ENCOUNTER — Other Ambulatory Visit (HOSPITAL_COMMUNITY): Payer: Self-pay

## 2021-01-21 MED FILL — Duloxetine HCl Enteric Coated Pellets Cap 60 MG (Base Eq): ORAL | 90 days supply | Qty: 90 | Fill #2 | Status: AC

## 2021-01-23 ENCOUNTER — Other Ambulatory Visit (HOSPITAL_COMMUNITY): Payer: Self-pay

## 2021-01-24 ENCOUNTER — Other Ambulatory Visit (HOSPITAL_COMMUNITY): Payer: Self-pay

## 2021-01-27 ENCOUNTER — Telehealth: Payer: Self-pay | Admitting: Orthopedic Surgery

## 2021-01-27 NOTE — Telephone Encounter (Signed)
E 

## 2021-02-01 NOTE — Progress Notes (Signed)
Office Visit Note  Patient: Adam [name]             Date of Birth: [birth year]           MRN: [id number]             PCP: Axel Filler, MD Referring: Axel Filler,* Visit Date: 02/03/2021 Occupation: @GUAROCC @  Subjective:  Pain in multiple joints  History of Present Illness: Adam [name] is a 50 y.o. male with history of seropositive rheumatoid arthritis and DDD.  He is currently taking Plaquenil 200 mg 1 tablet by mouth twice daily.  He restarted on Plaquenil in April 2022 which she took for about 3 months but ran out of the prescription.  He states that he noticed mild improvement in his symptoms while taking Plaquenil but his pain, stiffness, and fatigue have returned.  He is not experiencing significant fatigue on a daily basis and has a follow-up appointment with his PCP next week to further discuss.  He continues to see pain management and has an appointment today to discuss his medication regimen.  He has been taking hydrocodone 10-325 mg 5 times daily for pain relief and remains on Cymbalta 60 mg 1 capsule daily.  He has been experiencing increased pain, stiffness, and inflammation in both hands.  He also has chronic pain in both shoulders and has some discomfort in his feet.  He has ongoing neck and lower back discomfort.  She experiences nocturnal pain when lying on her sides at night due to trochanteric bursitis of both hips.  He plans on starting to increase his walking regimen for exercise. He stopped drinking alcohol over 13 months ago and has lost over 10 pounds intentionally since then.   Activities of Daily Living:  Patient reports morning stiffness for all day. Patient Reports nocturnal pain.  Difficulty dressing/grooming: Denies Difficulty climbing stairs: Denies Difficulty getting out of chair: Denies Difficulty using hands for taps, buttons, cutlery, and/or writing: Denies  Review of Systems  Constitutional:  Positive for fatigue.  HENT:   Negative for mouth sores, mouth dryness and nose dryness.   Eyes:  Negative for pain, itching and dryness.  Respiratory:  Negative for shortness of breath and difficulty breathing.   Cardiovascular:  Negative for chest pain and palpitations.  Gastrointestinal:  Negative for blood in stool, constipation and diarrhea.  Endocrine: Negative for increased urination.  Genitourinary:  Negative for difficulty urinating.  Musculoskeletal:  Positive for joint pain, joint pain, myalgias, morning stiffness, muscle tenderness and myalgias. Negative for joint swelling.  Skin:  Negative for color change, rash and redness.  Allergic/Immunologic: Negative for susceptible to infections.  Neurological:  Positive for numbness and weakness. Negative for dizziness, headaches and memory loss.  Hematological:  Negative for bruising/bleeding tendency.  Psychiatric/Behavioral:  Negative for confusion.    PMFS History:  Patient Active Problem List   Diagnosis Date Noted   AC (acromioclavicular) arthritis 06/09/2020   Tendinitis of right shoulder 04/26/2020   Acromioclavicular sprain, right, initial encounter 03/23/2020   Nonallopathic lesion of cervical region 03/03/2020   Nonallopathic lesion of lumbar region 03/03/2020   Nonallopathic lesion of sacral region 03/03/2020   Neck pain 03/03/2020   Alcohol use disorder, in early remission ([place]) 01/18/2020   Rheumatoid arthritis ([place]) 11/10/2018   Hearing loss 11/12/2016   Obesity, Class III, BMI 40-49.9 (morbid obesity) ([place]) 11/12/2016   Osteoarthritis of lumbar spine 04/09/2016   Tendinopathy of left rotator cuff 12/30/2015   Obstructive sleep apnea 02/28/2015  Type 2 diabetes mellitus with other specified complication ([place]) 97/35/3299   Erectile dysfunction 01/06/2015   Healthcare maintenance 01/06/2015   Essential hypertension 01/06/2015    Past Medical History:  Diagnosis Date   Anxiety    Arthritis    lower back, hands   Depression    Deviated  septum    nasal turbinate hypertrophy   Hypertension    states under control with meds., has been on medication since age 1   Insulin dependent diabetes mellitus    type 2 DM   Morbid obesity ([place])    Sleep apnea    uses CPAP "most of the time", per pt.    Family History  Problem Relation Age of Onset   Hyperlipidemia Mother    Aneurysm Mother    Hypertension Mother    Diabetes Father    Hyperlipidemia Father    Hypertension Father    Healthy Daughter    Healthy Daughter    Past Surgical History:  Procedure Laterality Date   CARPAL TUNNEL RELEASE Right 07/2014   LAPAROSCOPIC GASTRIC SLEEVE RESECTION     Dr. Excell Seltzer 09-09-17   LAPAROSCOPIC GASTRIC SLEEVE RESECTION N/A 09/09/2017   Procedure: LAPAROSCOPIC GASTRIC SLEEVE RESECTION  AND ERAS PATHWAY;  Surgeon: Excell Seltzer, MD;  Location: [facility];  Service: General;  Laterality: N/A;   LUMBAR FUSION     NASAL SEPTOPLASTY W/ TURBINOPLASTY Bilateral 03/22/2017   Procedure: NASAL SEPTOPLASTY WITH  BILATERAL INFERIOR   TURBINATE REDUCTION;  Surgeon: Jerrell Belfast, MD;  Location: [facility];  Service: ENT;  Laterality: Bilateral;   SHOULDER ARTHROSCOPY Left    x 2   SHOULDER ARTHROSCOPY WITH DISTAL CLAVICLE RESECTION Right 09/16/2020   Procedure: RIGHT SHOULDER ARTHROSCOPY WITH ARTHROSCOPIC DISTAL CLAVICLE EXCISION;  Surgeon: Meredith Pel, MD;  Location: [facility];  Service: Orthopedics;  Laterality: Right;   TONSILLECTOMY AND ADENOIDECTOMY     TYMPANOSTOMY TUBE PLACEMENT Bilateral    WISDOM TOOTH EXTRACTION     Social History   Social History Narrative   Not on file   Immunization History  Administered Date(s) Administered   Influenza Inj Mdck Quad Pf 01/10/2018   Influenza,inj,Quad PF,6+ Mos 01/06/2015, 01/07/2017, 01/22/2019, 02/01/2020   Influenza-Unspecified 03/10/2016   PFIZER(Purple Top)SARS-COV-2 Vaccination 07/04/2019, 07/28/2019, 07/05/2020   Pneumococcal Polysaccharide-23 01/06/2015   Td 07/11/2020   Tdap  12/09/2010     Objective: Vital Signs: BP (!) 143/90 (BP Location: Left Arm, Patient Position: Sitting, Cuff Size: Normal)   Pulse 64   Ht 6' (1.829 m)   Wt 223 lb (101.2 kg)   BMI 30.24 kg/m    Physical Exam Vitals and nursing note reviewed.  Constitutional:      Appearance: He is well-developed.  HENT:     Head: Normocephalic and atraumatic.  Eyes:     Conjunctiva/sclera: Conjunctivae normal.     Pupils: Pupils are equal, round, and reactive to light.  Pulmonary:     Effort: Pulmonary effort is normal.  Abdominal:     Palpations: Abdomen is soft.  Musculoskeletal:     Cervical back: Normal range of motion and neck supple.  Skin:    General: Skin is warm and dry.     Capillary Refill: Capillary refill takes less than 2 seconds.  Neurological:     Mental Status: He is alert and oriented to person, place, and time.  Psychiatric:        Behavior: Behavior normal.     Musculoskeletal Exam: C-spine has very limited range of motion  without rotation.  Painful range of motion of the shoulders especially the right shoulder.  Elbow joints have good range of motion with no tenderness or inflammation.  Wrist joints have good range of motion with tenderness over the right wrist.  Tenderness and synovitis of bilateral second and third MCP joints.  Complete fist formation bilaterally.  PIP and DIP thickening consistent with osteoarthritis of both hands.  Hip joints have good range of motion with no groin pain.  Tenderness over bilateral trochanteric bursa.  Small effusion and warmth in the right knee.  Left knee has good range of motion with no warmth or effusion.  Ankle joints have good range of motion with no tenderness or joint swelling.  CDAI Exam: CDAI Score: 15  Patient Global: 5 mm; Provider Global: 5 mm Swollen: 4 ; Tender: 10  Joint Exam 02/03/2021      Right  Left  Wrist   Tender     MCP 2  Swollen Tender  Swollen Tender  MCP 3  Swollen Tender   Tender  MCP 4   Tender      PIP 2   Tender   Tender  PIP 3   Tender     Knee  Swollen Tender        Investigation: No additional findings.  Imaging: No results found.  Recent Labs: Lab Results  Component Value Date   WBC 8.6 09/13/2020   HGB 15.7 09/13/2020   PLT 250 09/13/2020   NA 138 09/13/2020   K 3.8 09/13/2020   CL 101 09/13/2020   CO2 26 09/13/2020   GLUCOSE 258 (H) 09/13/2020   BUN 13 09/13/2020   CREATININE 0.94 09/13/2020   BILITOT 0.7 07/26/2020   ALKPHOS 95 01/18/2020   AST 19 07/26/2020   ALT 31 07/26/2020   PROT 7.1 07/26/2020   ALBUMIN 4.7 01/18/2020   CALCIUM 9.7 09/13/2020   GFRAA 104 07/26/2020   QFTBGOLDPLUS NEGATIVE 12/25/2018    Speciality Comments: No specialty comments available.  Procedures:  No procedures performed Allergies: Chlorhexidine    Assessment / Plan:     Visit Diagnoses: Rheumatoid arthritis involving multiple sites with positive rheumatoid factor (HCC) - Positive rheumatoid factor 73.4, anti-CCP negative.  Treated with Plaquenil in the past: He presents today with increased pain, stiffness, and inflammation involving multiple joints.  He has tenderness and synovitis of bilateral second and third MCP joints and has some warmth and swelling in the right knee.  He has been experiencing significant pain and chronic fatigue on a daily basis.  He is followed by pain management and has been taking Norco 10-325 mg 5 times daily for pain relief.  He restarted on Plaquenil 200 mg 1 tablet by mouth twice daily after his last office visit on 07/26/20.  He completed the 60-month supply of PLQ, but the prescription was not refilled due to the patient not updating lab work or returning for an office visit.  He had previously noticed improvement in his arthritis while taking Plaquenil.  A sed rate was checked today.  Different treatment options were discussed with the patient while in the office.  He would like to restart on Plaquenil.  A new prescription for Plaquenil 200 mg 1  tablet by mouth twice daily was sent to the pharmacy. He was also advised to notify us if his symptoms persist or worsen in which case he may require combination therapy in the future.  He is not a good candidate for methotrexate due to history  of elevated creatinine and elevated LFTs.  He will follow-up in the office in 2 months to assess his response to restarting Plaquenil.- Plan: Sedimentation rate  High risk medication use - Plaquenil 200 mg 1 tablet by mouth twice daily. BMP and CBC drawn on 09/13/2020.  History of elevated creatinine and elevated LFTs.  He discontinued drinking alcohol over 13 months ago.  He is overdue to update lab work.  Orders for CBC and CMP were released.  He will continue to require lab work every 5 months to monitor for drug toxicity.  No Plaquenil eye exam on file.  He was advised to call to schedule Plaquenil eye examination.  He was given a Plaquenil eye exam form to take with him to his next appointment.  - Plan: COMPLETE METABOLIC PANEL WITH GFR, CBC with Differential/Platelet  Trochanteric bursitis of both hips: He has tenderness over bilateral trochanteric bursa.  He experiences nocturnal pain since he has a side sleeper.  Discussed the importance of performing stretching exercises on a daily basis.  He was given a handout of exercises to perform.  Chondromalacia patellae, right knee: He has good range of motion of the right knee joint with no discomfort at this time.  Some swelling and warmth was noted in the right knee.  Tendinopathy of left rotator cuff: Good range of motion of the left shoulder with some discomfort and stiffness noted.  Arthritis of right acromioclavicular joint - MRI June 08, 2020 which showed supraspinatus and infra spinatus tendinopathy and acromioclavicular arthritis.  He had painful range of motion of the right shoulder joint on examination today.  DDD (degenerative disc disease), lumbar - s/p L5-S1 fusion.  Chronic pain.  He is  followed by pain management.  Other medical conditions are listed as follows:  Obstructive sleep apnea  Essential hypertension  Bilateral hearing loss, unspecified hearing loss type  Type 2 diabetes mellitus with other specified complication, with long-term current use of insulin (HCC)  BMI 32.0-32.9,adult  Vitamin D deficiency  Other fatigue  Orders: Orders Placed This Encounter  Procedures   COMPLETE METABOLIC PANEL WITH GFR   CBC with Differential/Platelet   Sedimentation rate   Meds ordered this encounter  Medications   hydroxychloroquine (PLAQUENIL) 200 MG tablet    Sig: Take 1 tablet (200 mg total) by mouth 2 (two) times daily.    Dispense:  180 tablet    Refill:  0      Follow-Up Instructions: Return in about 2 months (around 04/05/2021) for Rheumatoid arthritis, DDD.   Ofilia Neas, PA-C  Note - This record has been created using Dragon software.  Chart creation errors have been sought, but may not always  have been located. Such creation errors do not reflect on  the standard of medical care.

## 2021-02-03 ENCOUNTER — Ambulatory Visit (INDEPENDENT_AMBULATORY_CARE_PROVIDER_SITE_OTHER): Payer: 59 | Admitting: Physician Assistant

## 2021-02-03 ENCOUNTER — Encounter: Payer: Self-pay | Admitting: Physician Assistant

## 2021-02-03 ENCOUNTER — Other Ambulatory Visit (HOSPITAL_COMMUNITY): Payer: Self-pay

## 2021-02-03 ENCOUNTER — Other Ambulatory Visit: Payer: Self-pay

## 2021-02-03 VITALS — BP 143/90 | HR 64 | Ht 72.0 in | Wt 223.0 lb

## 2021-02-03 DIAGNOSIS — G894 Chronic pain syndrome: Secondary | ICD-10-CM | POA: Diagnosis not present

## 2021-02-03 DIAGNOSIS — M2550 Pain in unspecified joint: Secondary | ICD-10-CM | POA: Diagnosis not present

## 2021-02-03 DIAGNOSIS — M7061 Trochanteric bursitis, right hip: Secondary | ICD-10-CM

## 2021-02-03 DIAGNOSIS — I10 Essential (primary) hypertension: Secondary | ICD-10-CM | POA: Diagnosis not present

## 2021-02-03 DIAGNOSIS — M2241 Chondromalacia patellae, right knee: Secondary | ICD-10-CM | POA: Diagnosis not present

## 2021-02-03 DIAGNOSIS — M5136 Other intervertebral disc degeneration, lumbar region: Secondary | ICD-10-CM | POA: Diagnosis not present

## 2021-02-03 DIAGNOSIS — M19011 Primary osteoarthritis, right shoulder: Secondary | ICD-10-CM | POA: Diagnosis not present

## 2021-02-03 DIAGNOSIS — E1169 Type 2 diabetes mellitus with other specified complication: Secondary | ICD-10-CM

## 2021-02-03 DIAGNOSIS — M67912 Unspecified disorder of synovium and tendon, left shoulder: Secondary | ICD-10-CM | POA: Diagnosis not present

## 2021-02-03 DIAGNOSIS — G4733 Obstructive sleep apnea (adult) (pediatric): Secondary | ICD-10-CM

## 2021-02-03 DIAGNOSIS — M25519 Pain in unspecified shoulder: Secondary | ICD-10-CM | POA: Diagnosis not present

## 2021-02-03 DIAGNOSIS — Z794 Long term (current) use of insulin: Secondary | ICD-10-CM

## 2021-02-03 DIAGNOSIS — Z79899 Other long term (current) drug therapy: Secondary | ICD-10-CM

## 2021-02-03 DIAGNOSIS — R5383 Other fatigue: Secondary | ICD-10-CM

## 2021-02-03 DIAGNOSIS — M7062 Trochanteric bursitis, left hip: Secondary | ICD-10-CM

## 2021-02-03 DIAGNOSIS — M7060 Trochanteric bursitis, unspecified hip: Secondary | ICD-10-CM | POA: Diagnosis not present

## 2021-02-03 DIAGNOSIS — H9193 Unspecified hearing loss, bilateral: Secondary | ICD-10-CM

## 2021-02-03 DIAGNOSIS — M0579 Rheumatoid arthritis with rheumatoid factor of multiple sites without organ or systems involvement: Secondary | ICD-10-CM

## 2021-02-03 DIAGNOSIS — Z6832 Body mass index (BMI) 32.0-32.9, adult: Secondary | ICD-10-CM

## 2021-02-03 DIAGNOSIS — E559 Vitamin D deficiency, unspecified: Secondary | ICD-10-CM

## 2021-02-03 MED ORDER — HYDROXYCHLOROQUINE SULFATE 200 MG PO TABS
200.0000 mg | ORAL_TABLET | Freq: Two times a day (BID) | ORAL | 0 refills | Status: DC
  Filled 2021-02-03 (×2): qty 180, 90d supply, fill #0

## 2021-02-03 MED ORDER — HYDROCODONE-ACETAMINOPHEN 10-325 MG PO TABS
1.0000 | ORAL_TABLET | Freq: Every day | ORAL | 0 refills | Status: DC
  Filled 2021-02-03: qty 150, 30d supply, fill #0

## 2021-02-03 MED ORDER — TRAMADOL HCL ER 100 MG PO TB24
100.0000 mg | ORAL_TABLET | Freq: Every day | ORAL | 0 refills | Status: DC
  Filled 2021-02-03: qty 30, 30d supply, fill #0

## 2021-02-03 MED ORDER — PREGABALIN 25 MG PO CAPS
25.0000 mg | ORAL_CAPSULE | Freq: Three times a day (TID) | ORAL | 0 refills | Status: DC | PRN
  Filled 2021-02-03: qty 90, 30d supply, fill #0

## 2021-02-03 MED ORDER — TRAMADOL HCL ER 200 MG PO TB24
200.0000 mg | ORAL_TABLET | Freq: Every day | ORAL | 0 refills | Status: DC
  Filled 2021-02-03: qty 30, 30d supply, fill #0

## 2021-02-03 NOTE — Patient Instructions (Signed)
Iliotibial Band Syndrome Rehab Ask your health care provider which exercises are safe for you. Do exercises exactly as told by your health care provider and adjust them as directed. It is normal to feel mild stretching, pulling, tightness, or discomfort as you do these exercises. Stop right away if you feel sudden pain or your pain gets significantly worse. Do not begin these exercises until told by your health care provider. Stretching and range-of-motion exercises These exercises warm up your muscles and joints and improve the movement and flexibility of your hip and pelvis. Quadriceps stretch, prone  Lie on your abdomen (prone position) on a firm surface, such as a bed or padded floor. Bend your left / right knee and reach back to hold your ankle or pant leg. If you cannot reach your ankle or pant leg, loop a belt around your foot and grab the belt instead. Gently pull your heel toward your buttocks. Your knee should not slide out to the side. You should feel a stretch in the front of your thigh and knee (quadriceps). Hold this position for __________ seconds. Repeat __________ times. Complete this exercise __________ times a day. Iliotibial band stretch An iliotibial band is a strong band of muscle tissue that runs from the outer side of your hip to the outer side of your thigh and knee. Lie on your side with your left / right leg in the top position. Bend both of your knees and grab your left / right ankle. Stretch out your bottom arm to help you balance. Slowly bring your top knee back so your thigh goes behind your trunk. Slowly lower your top leg toward the floor until you feel a gentle stretch on the outside of your left / right hip and thigh. If you do not feel a stretch and your knee will not fall farther, place the heel of your other foot on top of your knee and pull your knee down toward the floor with your foot. Hold this position for __________ seconds. Repeat __________ times.  Complete this exercise __________ times a day. Strengthening exercises These exercises build strength and endurance in your hip and pelvis. Endurance is the ability to use your muscles for a long time, even after they get tired. Straight leg raises, side-lying This exercise strengthens the muscles that rotate the leg at the hip and move it away from your body (hip abductors). Lie on your side with your left / right leg in the top position. Lie so your head, shoulder, hip, and knee line up. You may bend your bottom knee to help you balance. Roll your hips slightly forward so your hips are stacked directly over each other and your left / right knee is facing forward. Tense the muscles in your outer thigh and lift your top leg 4-6 inches (10-15 cm). Hold this position for __________ seconds. Slowly lower your leg to return to the starting position. Let your muscles relax completely before doing another repetition. Repeat __________ times. Complete this exercise __________ times a day. Leg raises, prone This exercise strengthens the muscles that move the hips backward (hip extensors). Lie on your abdomen (prone position) on your bed or a firm surface. You can put a pillow under your hips if that is more comfortable for your lower back. Bend your left / right knee so your foot is straight up in the air. Squeeze your buttocks muscles and lift your left / right thigh off the bed. Do not let your back arch. Tense   your thigh muscle as hard as you can without increasing any knee pain. Hold this position for __________ seconds. Slowly lower your leg to return to the starting position and allow it to relax completely. Repeat __________ times. Complete this exercise __________ times a day. Hip hike Stand sideways on a bottom step. Stand on your left / right leg with your other foot unsupported next to the step. You can hold on to a railing or wall for balance if needed. Keep your knees straight and your  torso square. Then lift your left / right hip up toward the ceiling. Slowly let your left / right hip lower toward the floor, past the starting position. Your foot should get closer to the floor. Do not lean or bend your knees. Repeat __________ times. Complete this exercise __________ times a day. This information is not intended to replace advice given to you by your health care provider. Make sure you discuss any questions you have with your health care provider. Document Revised: 06/17/2019 Document Reviewed: 06/17/2019 Elsevier Patient Education  2022 [place]. Hip Bursitis Rehab Ask your health care provider which exercises are safe for you. Do exercises exactly as told by your health care provider and adjust them as directed. It is normal to feel mild stretching, pulling, tightness, or discomfort as you do these exercises. Stop right away if you feel sudden pain or your pain gets worse. Do not begin these exercises until told by your health care provider. Stretching exercise This exercise warms up your muscles and joints and improves the movement and flexibility of your hip. This exercise also helps to relieve pain and stiffness. Iliotibial band stretch An iliotibial band is a strong band of muscle tissue that runs from the outer side of your hip to the outer side of your thigh and knee. Lie on your side with your left / right leg in the top position. Bend your left / right knee and grab your ankle. Stretch out your bottom arm to help you balance. Slowly bring your knee back so your thigh is behind your body. Slowly lower your knee toward the floor until you feel a gentle stretch on the outside of your left / right thigh. If you do not feel a stretch and your knee will not fall farther, place the heel of your other foot on top of your knee and pull your knee down toward the floor with your foot. Hold this position for __________ seconds. Slowly return to the starting position. Repeat  __________ times. Complete this exercise __________ times a day. Strengthening exercises These exercises build strength and endurance in your hip and pelvis. Endurance is the ability to use your muscles for a long time, even after they get tired. Bridge This exercise strengthens the muscles that move your thigh backward (hip extensors). Lie on your back on a firm surface with your knees bent and your feet flat on the floor. Tighten your buttocks muscles and lift your buttocks off the floor until your trunk is level with your thighs. Do not arch your back. You should feel the muscles working in your buttocks and the back of your thighs. If you do not feel these muscles, slide your feet 1-2 inches (2.5-5 cm) farther away from your buttocks. If this exercise is too easy, try doing it with your arms crossed over your chest. Hold this position for __________ seconds. Slowly lower your hips to the starting position. Let your muscles relax completely after each repetition. Repeat __________  times. Complete this exercise __________ times a day. Squats This exercise strengthens the muscles in front of your thigh and knee (quadriceps). Stand in front of a table, with your feet and knees pointing straight ahead. You may rest your hands on the table for balance but not for support. Slowly bend your knees and lower your hips like you are going to sit in a chair. Keep your weight over your heels, not over your toes. Keep your lower legs upright so they are parallel with the table legs. Do not let your hips go lower than your knees. Do not bend lower than told by your health care provider. If your hip pain increases, do not bend as low. Hold the squat position for __________ seconds. Slowly push with your legs to return to standing. Do not use your hands to pull yourself to standing. Repeat __________ times. Complete this exercise __________ times a day. Hip hike Stand sideways on a bottom step. Stand on  your left / right leg with your other foot unsupported next to the step. You can hold on to the railing or wall for balance if needed. Keep your knees straight and your torso square. Then lift your left / right hip up toward the ceiling. Hold this position for __________ seconds. Slowly let your left / right hip lower toward the floor, past the starting position. Your foot should get closer to the floor. Do not lean or bend your knees. Repeat __________ times. Complete this exercise __________ times a day. Single leg stand Without shoes, stand near a railing or in a doorway. You may hold on to the railing or door frame as needed for balance. Squeeze your left / right buttock muscles, then lift up your other foot. Do not let your left / right hip push out to the side. It is helpful to stand in front of a mirror for this exercise so you can watch your hip. Hold this position for __________ seconds. Repeat __________ times. Complete this exercise __________ times a day. This information is not intended to replace advice given to you by your health care provider. Make sure you discuss any questions you have with your health care provider. Document Revised: 08/04/2018 Document Reviewed: 08/04/2018 Elsevier Patient Education  [place].

## 2021-02-04 LAB — CBC WITH DIFFERENTIAL/PLATELET
Absolute Monocytes: 469 cells/uL (ref 200–950)
Basophils Absolute: 33 cells/uL (ref 0–200)
Basophils Relative: 0.5 %
Eosinophils Absolute: 172 cells/uL (ref 15–500)
Eosinophils Relative: 2.6 %
HCT: 48.2 % (ref 38.5–50.0)
Hemoglobin: 16.1 g/dL (ref 13.2–17.1)
Lymphs Abs: 1927 cells/uL (ref 850–3900)
MCH: 29.9 pg (ref 27.0–33.0)
MCHC: 33.4 g/dL (ref 32.0–36.0)
MCV: 89.4 fL (ref 80.0–100.0)
MPV: 10.3 fL (ref 7.5–12.5)
Monocytes Relative: 7.1 %
Neutro Abs: 4000 cells/uL (ref 1500–7800)
Neutrophils Relative %: 60.6 %
Platelets: 250 10*3/uL (ref 140–400)
RBC: 5.39 10*6/uL (ref 4.20–5.80)
RDW: 12.5 % (ref 11.0–15.0)
Total Lymphocyte: 29.2 %
WBC: 6.6 10*3/uL (ref 3.8–10.8)

## 2021-02-04 LAB — COMPLETE METABOLIC PANEL WITH GFR
AG Ratio: 2.5 (calc) (ref 1.0–2.5)
ALT: 18 U/L (ref 9–46)
AST: 19 U/L (ref 10–35)
Albumin: 4.7 g/dL (ref 3.6–5.1)
Alkaline phosphatase (APISO): 71 U/L (ref 35–144)
BUN: 13 mg/dL (ref 7–25)
CO2: 29 mmol/L (ref 20–32)
Calcium: 9.8 mg/dL (ref 8.6–10.3)
Chloride: 102 mmol/L (ref 98–110)
Creat: 0.92 mg/dL (ref 0.70–1.30)
Globulin: 1.9 g/dL (calc) (ref 1.9–3.7)
Glucose, Bld: 125 mg/dL — ABNORMAL HIGH (ref 65–99)
Potassium: 4.2 mmol/L (ref 3.5–5.3)
Sodium: 141 mmol/L (ref 135–146)
Total Bilirubin: 0.4 mg/dL (ref 0.2–1.2)
Total Protein: 6.6 g/dL (ref 6.1–8.1)
eGFR: 100 mL/min/{1.73_m2} (ref >=60)

## 2021-02-04 LAB — SEDIMENTATION RATE: Sed Rate: 2 mm/h (ref 0–20)

## 2021-02-06 ENCOUNTER — Other Ambulatory Visit (HOSPITAL_COMMUNITY): Payer: Self-pay

## 2021-02-06 MED ORDER — TRAMADOL HCL ER 200 MG PO TB24
200.0000 mg | ORAL_TABLET | Freq: Every day | ORAL | 0 refills | Status: DC
  Filled 2021-02-06: qty 30, 30d supply, fill #0

## 2021-02-06 NOTE — Progress Notes (Signed)
Glucose is 125.  Rest of CMP WNL.  CBC WNL.  ESR WNL.

## 2021-02-13 ENCOUNTER — Encounter: Payer: Self-pay | Admitting: Student in an Organized Health Care Education/Training Program

## 2021-02-13 ENCOUNTER — Ambulatory Visit (INDEPENDENT_AMBULATORY_CARE_PROVIDER_SITE_OTHER): Payer: 59 | Admitting: Student in an Organized Health Care Education/Training Program

## 2021-02-13 ENCOUNTER — Other Ambulatory Visit (HOSPITAL_COMMUNITY): Payer: Self-pay

## 2021-02-13 VITALS — BP 125/73 | HR 59 | Temp 97.8°F | Ht 72.0 in | Wt 225.1 lb

## 2021-02-13 DIAGNOSIS — M069 Rheumatoid arthritis, unspecified: Secondary | ICD-10-CM | POA: Diagnosis not present

## 2021-02-13 DIAGNOSIS — Z23 Encounter for immunization: Secondary | ICD-10-CM | POA: Diagnosis not present

## 2021-02-13 DIAGNOSIS — Z0000 Encounter for general adult medical examination without abnormal findings: Secondary | ICD-10-CM

## 2021-02-13 DIAGNOSIS — Z7984 Long term (current) use of oral hypoglycemic drugs: Secondary | ICD-10-CM

## 2021-02-13 DIAGNOSIS — Z6830 Body mass index (BMI) 30.0-30.9, adult: Secondary | ICD-10-CM | POA: Diagnosis not present

## 2021-02-13 DIAGNOSIS — E1169 Type 2 diabetes mellitus with other specified complication: Secondary | ICD-10-CM | POA: Diagnosis not present

## 2021-02-13 DIAGNOSIS — F1021 Alcohol dependence, in remission: Secondary | ICD-10-CM

## 2021-02-13 DIAGNOSIS — Z794 Long term (current) use of insulin: Secondary | ICD-10-CM

## 2021-02-13 DIAGNOSIS — I10 Essential (primary) hypertension: Secondary | ICD-10-CM

## 2021-02-13 LAB — GLUCOSE, CAPILLARY: Glucose-Capillary: 170 mg/dL — ABNORMAL HIGH (ref 70–99)

## 2021-02-13 LAB — POCT GLYCOSYLATED HEMOGLOBIN (HGB A1C): Hemoglobin A1C: 6.5 % — AB (ref 4.0–5.6)

## 2021-02-13 NOTE — Assessment & Plan Note (Addendum)
Hemoglobin A1c 6.5% today. Pt reports previous elevated reading of 11.5% likely occurred in the setting of recent cessation of drinking and increased food consumption. Since then, he has been eating well, with no reported side effects. His usual blood sugar during at home tests is ~130. Lowest readings in 70s, while highest readings in 190s. Will discontinue Actos today as it is likely not contributing much.  - Continue Metformin 1000mg  BID - Continue Farxiga 5mg  daily - Repeat A1c in 3-4 months

## 2021-02-13 NOTE — Progress Notes (Signed)
Attestation for Student Documentation:  I personally was present and performed or re-performed the history, physical exam and medical decision-making activities of this service and have verified that the service and findings are accurately documented in the student's note.  50 year old person here for follow-up of hypertension and diabetes.  Continues to lose weight nicely since gastric sleeve surgery in 2019.  Had a right AC joint surgery in May and has recovered nicely.  Diabetes back under excellent control on current regimen of metformin and Farxiga, going to discontinue pioglitazone as I do not think it is adding much at this point.  Hypertension is under good control, will continue the current regimen.  Rheumatoid arthritis under good control on Plaquenil, under the care of rheumatology.  Follow-up with me in 3-4 months, patient now traveling back and forth between [place] and [place] for work.  Axel Filler, MD 02/13/2021, 2:19 PM

## 2021-02-13 NOTE — Assessment & Plan Note (Signed)
Flu shot administered today. 

## 2021-02-13 NOTE — Assessment & Plan Note (Signed)
Continues to do well and is stable. Continues to maintain lifestyle modifications.

## 2021-02-13 NOTE — Assessment & Plan Note (Signed)
BP 125/73. Well-managed. Pt reports one episode of dizziness/lightheadedness when sitting upright from a laying position a few days ago. This has not occurred since or while driving/at work. Instructed to continue monitoring for any similar symptoms or episodes and report these if continuing to occur.  - Continue Losartan 100mg  daily - Continue Amlodipine 5mg  daily - Continue HCTZ 25mg  daily

## 2021-02-13 NOTE — Assessment & Plan Note (Addendum)
Continues to lose weight - 225lb today with BMI of 30. Goal is 220lbs. He estimates he has lost ~100lbs at this point. Reports eating better and desires to start walking regularly again, which has been difficult with intensity of new position at work. We talked about nutrition and exercise, will continue with current lifestyle modifications. He did not tolerate a GLP1 agonist in the past.

## 2021-02-13 NOTE — Progress Notes (Signed)
  Subjective:     Patient ID: Adam [name], male   DOB: [birth year], 50 y.o.   MRN: [id number]  Diabetes Associated symptoms include fatigue.  Back Pain Pertinent negatives include no abdominal pain.  Neck Pain   Shoulder Pain   HPI: Adam [name] presents to clinic for continued management of his diabetes following elevated A1C of 11.5 in March 2022. He reports no new complaints today and continues to monitor his blood sugar, which he reports usually sit around 130. He is having no issues taking his medications and reports no difficulty with side effects. A few days ago, he felt dizziness when standing up after laying down and relaxing. However, he notes that this only occurred once and has not occurred since.  He reports fatigue, which he thinks occurs in the setting of a job change and ongoing rheumatoid arthritis, though his RA is well-managed on plaquenil with a rheumatologist.  He has been working in [place] for the past few months and notes that this has been demanding.  Review of Systems  Constitutional:  Positive for fatigue.  Cardiovascular:  Negative for leg swelling.  Gastrointestinal:  Negative for abdominal pain, constipation, diarrhea and nausea.  Musculoskeletal:  Positive for back pain and neck pain.        Objective:   Physical Exam Constitutional:      General: He is not in acute distress.    Appearance: Normal appearance. He is not ill-appearing.  HENT:     Head: Normocephalic and atraumatic.  Cardiovascular:     Rate and Rhythm: Regular rhythm. Bradycardia present.  Pulmonary:     Effort: Pulmonary effort is normal. No respiratory distress.     Breath sounds: Normal breath sounds. No stridor. No rhonchi.  Abdominal:     General: Abdomen is flat. Bowel sounds are normal. There is no distension.     Palpations: Abdomen is soft.     Tenderness: There is no abdominal tenderness. There is no guarding.  Musculoskeletal:     Right lower leg: No edema.      Left lower leg: No edema.  Skin:    General: Skin is warm and dry.  Neurological:     General: No focal deficit present.       Assessment:     See Problem Based Charting

## 2021-02-17 ENCOUNTER — Other Ambulatory Visit (HOSPITAL_COMMUNITY): Payer: Self-pay

## 2021-03-02 ENCOUNTER — Other Ambulatory Visit (HOSPITAL_COMMUNITY): Payer: Self-pay

## 2021-03-02 DIAGNOSIS — M7060 Trochanteric bursitis, unspecified hip: Secondary | ICD-10-CM | POA: Diagnosis not present

## 2021-03-02 DIAGNOSIS — M5136 Other intervertebral disc degeneration, lumbar region: Secondary | ICD-10-CM | POA: Diagnosis not present

## 2021-03-02 DIAGNOSIS — Z79891 Long term (current) use of opiate analgesic: Secondary | ICD-10-CM | POA: Diagnosis not present

## 2021-03-02 DIAGNOSIS — M542 Cervicalgia: Secondary | ICD-10-CM | POA: Diagnosis not present

## 2021-03-02 DIAGNOSIS — M25519 Pain in unspecified shoulder: Secondary | ICD-10-CM | POA: Diagnosis not present

## 2021-03-02 DIAGNOSIS — G894 Chronic pain syndrome: Secondary | ICD-10-CM | POA: Diagnosis not present

## 2021-03-02 DIAGNOSIS — Z79899 Other long term (current) drug therapy: Secondary | ICD-10-CM | POA: Diagnosis not present

## 2021-03-02 MED ORDER — HYDROCODONE BITARTRATE ER 20 MG PO T24A
20.0000 mg | EXTENDED_RELEASE_TABLET | Freq: Every day | ORAL | 0 refills | Status: DC
  Filled 2021-04-07: qty 30, 30d supply, fill #0

## 2021-03-02 MED ORDER — HYDROCODONE-ACETAMINOPHEN 10-325 MG PO TABS
1.0000 | ORAL_TABLET | ORAL | 0 refills | Status: DC
  Filled 2021-03-02: qty 150, 30d supply, fill #0

## 2021-03-07 ENCOUNTER — Other Ambulatory Visit (HOSPITAL_COMMUNITY): Payer: Self-pay

## 2021-03-21 ENCOUNTER — Other Ambulatory Visit (HOSPITAL_COMMUNITY): Payer: Self-pay

## 2021-03-24 NOTE — Progress Notes (Signed)
Office Visit Note  Patient: Adam [name]             Date of Birth: 1971-01-26           MRN: [id number]             PCP: Axel Filler, MD Referring: Axel Filler,* Visit Date: 04/07/2021 Occupation: @GUAROCC @  Subjective:  Generalized pain   History of Present Illness: Adam [name] is a 49 y.o. male with history of seropositive rheumatoid arthritis and DDD.  He is taking plaquenil 200 mg 1 tablet by mouth twice daily.  He continues to tolerate Plaquenil without any side effects.  He denies any signs or symptoms of a rheumatoid arthritis flare.  He has not noticed any joint swelling.  He presents today with left heel pain which has been constant.  His pain is most severe after being sedentary and taking the first steps.  He got new tennis shoes couple of months ago which has not made any improvement in his symptoms.  He continues to have trochanter bursitis of both hips which makes it difficult to sleep at night.  He also has chronic pain in his lower back status post lumbar fusion several years ago.  The pain and stiffness in his hands has improved significantly.  He continues to have generalized pain on a daily basis.  He describes it as a generalized aching in his joints and muscles.  He is followed closely by pain management.  He continues to take tramadol and hydrocodone for pain relief.  He tried Lyrica at a low dose but did not notice any improvement in his symptoms.  He remains on Cymbalta 60 mg daily but has had worsening anxiety due to his level of pain.  He continues to have interrupted sleep at night due to nocturnal pain.  Last night he only slept for 3 hours.  He has an appointment today with pain management and plans on further discussing his medication regimen.   Activities of Daily Living:  Patient reports morning stiffness for all day. Patient Reports nocturnal pain.  Difficulty dressing/grooming: Denies Difficulty climbing stairs:  Reports Difficulty getting out of chair: Reports Difficulty using hands for taps, buttons, cutlery, and/or writing: Denies  Review of Systems  Constitutional:  Positive for fatigue.  HENT:  Negative for mouth sores, mouth dryness and nose dryness.   Eyes:  Negative for pain, itching and dryness.  Respiratory:  Negative for shortness of breath and difficulty breathing.   Cardiovascular:  Negative for chest pain and palpitations.  Gastrointestinal:  Negative for blood in stool, constipation and diarrhea.  Endocrine: Negative for increased urination.  Genitourinary:  Negative for difficulty urinating.  Musculoskeletal:  Positive for joint pain, joint pain, myalgias, morning stiffness, muscle tenderness and myalgias. Negative for joint swelling.  Skin:  Negative for color change, rash and redness.  Allergic/Immunologic: Negative for susceptible to infections.  Neurological:  Positive for weakness. Negative for dizziness, numbness, headaches and memory loss.  Hematological:  Negative for bruising/bleeding tendency.  Psychiatric/Behavioral:  Positive for sleep disturbance. Negative for confusion.    PMFS History:  Patient Active Problem List   Diagnosis Date Noted   AC (acromioclavicular) arthritis 06/09/2020   Tendinitis of right shoulder 04/26/2020   Acromioclavicular sprain, right, initial encounter 03/23/2020   Nonallopathic lesion of cervical region 03/03/2020   Nonallopathic lesion of lumbar region 03/03/2020   Nonallopathic lesion of sacral region 03/03/2020   Neck pain 03/03/2020   Alcohol use disorder, in early  remission ([place]) 01/18/2020   Rheumatoid arthritis ([place]) 11/10/2018   Hearing loss 11/12/2016   Obesity, Class III, BMI 40-49.9 (morbid obesity) ([place]) 11/12/2016   Osteoarthritis of lumbar spine 04/09/2016   Tendinopathy of left rotator cuff 12/30/2015   Obstructive sleep apnea 02/28/2015   Type 2 diabetes mellitus with other specified complication ([place]) 42/35/3614    Erectile dysfunction 01/06/2015   Healthcare maintenance 01/06/2015   Essential hypertension 01/06/2015    Past Medical History:  Diagnosis Date   Anxiety    Arthritis    lower back, hands   Depression    Deviated septum    nasal turbinate hypertrophy   Hypertension    states under control with meds., has been on medication since age 53   Insulin dependent diabetes mellitus    type 2 DM   Morbid obesity ([place])    Sleep apnea    uses CPAP "most of the time", per pt.    Family History  Problem Relation Age of Onset   Hyperlipidemia Mother    Aneurysm Mother    Hypertension Mother    Diabetes Father    Hyperlipidemia Father    Hypertension Father    Healthy Daughter    Healthy Daughter    Past Surgical History:  Procedure Laterality Date   CARPAL TUNNEL RELEASE Right 07/2014   LAPAROSCOPIC GASTRIC SLEEVE RESECTION     Dr. Excell Seltzer 09-09-17   LAPAROSCOPIC GASTRIC SLEEVE RESECTION N/A 09/09/2017   Procedure: LAPAROSCOPIC GASTRIC SLEEVE RESECTION  AND ERAS PATHWAY;  Surgeon: Excell Seltzer, MD;  Location: [facility];  Service: General;  Laterality: N/A;   LUMBAR FUSION     NASAL SEPTOPLASTY W/ TURBINOPLASTY Bilateral 03/22/2017   Procedure: NASAL SEPTOPLASTY WITH  BILATERAL INFERIOR   TURBINATE REDUCTION;  Surgeon: Jerrell Belfast, MD;  Location: [facility];  Service: ENT;  Laterality: Bilateral;   SHOULDER ARTHROSCOPY Left    x 2   SHOULDER ARTHROSCOPY WITH DISTAL CLAVICLE RESECTION Right 09/16/2020   Procedure: RIGHT SHOULDER ARTHROSCOPY WITH ARTHROSCOPIC DISTAL CLAVICLE EXCISION;  Surgeon: Meredith Pel, MD;  Location: [facility];  Service: Orthopedics;  Laterality: Right;   TONSILLECTOMY AND ADENOIDECTOMY     TYMPANOSTOMY TUBE PLACEMENT Bilateral    WISDOM TOOTH EXTRACTION     Social History   Social History Narrative   Not on file   Immunization History  Administered Date(s) Administered   Influenza Inj Mdck Quad Pf 01/10/2018   Influenza,inj,Quad PF,6+ Mos  01/06/2015, 01/07/2017, 01/22/2019, 02/01/2020, 02/13/2021   Influenza-Unspecified 03/10/2016   PFIZER(Purple Top)SARS-COV-2 Vaccination 07/04/2019, 07/28/2019, 07/05/2020   Pneumococcal Polysaccharide-23 01/06/2015   Td 07/11/2020   Tdap 12/09/2010     Objective: Vital Signs: BP 126/85 (BP Location: Left Arm, Patient Position: Sitting, Cuff Size: Large)   Pulse 75   Ht 6' (1.829 m)   Wt 223 lb 12.8 oz (101.5 kg)   BMI 30.35 kg/m    Physical Exam Vitals and nursing note reviewed.  Constitutional:      Appearance: He is well-developed.  HENT:     Head: Normocephalic and atraumatic.  Eyes:     Conjunctiva/sclera: Conjunctivae normal.     Pupils: Pupils are equal, round, and reactive to light.  Pulmonary:     Effort: Pulmonary effort is normal.  Abdominal:     Palpations: Abdomen is soft.  Musculoskeletal:     Cervical back: Normal range of motion and neck supple.  Skin:    General: Skin is warm and dry.     Capillary Refill:  Capillary refill takes less than 2 seconds.  Neurological:     Mental Status: He is alert and oriented to person, place, and time.  Psychiatric:        Behavior: Behavior normal.     Musculoskeletal Exam: Generalized pain.  C-spine is limited range of motion with lateral rotation.  Painful range of motion of the lumbar spine.  Shoulder joints have painful range of motion and tenderness bilaterally.  Elbow joints have good range of motion with no tenderness or inflammation.  Wrist joints, MCPs, PIPs, DIPs have good range of motion with no synovitis.  Complete fist formation bilaterally.  PIP and DIP thickening consistent with osteoarthritis of both hands.  Hip joints have good range of motion with no groin pain.  Tenderness over bilateral trochanteric bursa.  Knee joints have good range of motion with no warmth or effusion.  Ankle joints have good range of motion with no tenderness or joint swelling.  Tenderness along the plantar fascia of the left foot  noted.  CDAI Exam: CDAI Score: 0.4  Patient Global: 2 mm; Provider Global: 2 mm Swollen: 0 ; Tender: 0  Joint Exam 04/07/2021   No joint exam has been documented for this visit   There is currently no information documented on the homunculus. Go to the Rheumatology activity and complete the homunculus joint exam.  Investigation: No additional findings.  Imaging: No results found.  Recent Labs: Lab Results  Component Value Date   WBC 6.6 02/03/2021   HGB 16.1 02/03/2021   PLT 250 02/03/2021   NA 141 02/03/2021   K 4.2 02/03/2021   CL 102 02/03/2021   CO2 29 02/03/2021   GLUCOSE 125 (H) 02/03/2021   BUN 13 02/03/2021   CREATININE 0.92 02/03/2021   BILITOT 0.4 02/03/2021   ALKPHOS 95 01/18/2020   AST 19 02/03/2021   ALT 18 02/03/2021   PROT 6.6 02/03/2021   ALBUMIN 4.7 01/18/2020   CALCIUM 9.8 02/03/2021   GFRAA 104 07/26/2020   QFTBGOLDPLUS NEGATIVE 12/25/2018    Speciality Comments: No specialty comments available.  Procedures:  Foot Inj  Date/Time: 04/07/2021 9:44 AM Performed by: Bo Merino, MD Authorized by: Bo Merino, MD   Consent Given by:  Patient Site marked: the procedure site was marked   Timeout: prior to procedure the correct patient, procedure, and site was verified   Indications:  Fasciitis Condition: Plantar Fasciitis   Location: left plantar fascia muscle   Prep: patient was prepped and draped in usual sterile fashion   Needle Size:  27 G Medications:  0.5 mL lidocaine 1 %; 20 mg triamcinolone acetonide 40 MG/ML Patient Tolerance:  Patient tolerated the procedure well with no immediate complications Allergies: Chlorhexidine   Assessment / Plan:     Visit Diagnoses: Rheumatoid arthritis involving multiple sites with positive rheumatoid factor (HCC) - Positive rheumatoid factor 73.4, anti-CCP negative.  Treated with Plaquenil in the past: He has no synovitis on examination today.  His rheumatoid arthritis is well controlled on  Plaquenil 200 mg 1 tablet by mouth twice daily.  The discomfort and stiffness in his hands has improved significantly on current therapy.  He continues to have chronic generalized pain and is under the care of Dr. Radene Knee.  He has been taking tramadol for pain relief and remains on Cymbalta as prescribed.  He briefly tried a prescription for Lyrica at a low dose and did not notice any improvement and discontinued.  He may benefit from retrying Lyrica and possibly at  a higher dose if tolerated.  He has been experiencing worsening anxiety due to the chronicity and severity of his pain level.  He has had significant difficulty sleeping at night due to anxiety as well as nocturnal pain.  He may need to consider increasing the dose of Cymbalta or starting an antianxiety medication.  He plans on further discussing with Dr. Radene Knee today.  Our office note will be forwarded to Dr. Radene Knee as requested.  He will remain on Plaquenil as prescribed for rheumatoid arthritis.  He does not need a refill at this time.  He was advised to notify us if he develops increased joint pain or joint swelling due to underlying rheumatoid arthritis.  We will follow-up in the office in 5 months.  High risk medication use - Plaquenil 200 mg 1 tablet by mouth twice daily.  CBC and CMP were updated on 02/03/2021.  He will continue to require lab work every 5 months to monitor for drug toxicity.  He is overdue to update a Plaquenil eye examination.  He is given a Plaquenil eye exam form to take with him to his next appointment. A work note was provided today stating that he will remain under our care indefinitely.  He will continue to require future office visit and lab monitoring.  Trochanteric bursitis of both hips: He has ongoing pain due to trochanter bursitis of both hips.  He has difficulty lying on his sides at night due to nocturnal pain.  Discussed the importance of performing stretching exercises daily.  Chondromalacia patellae, right  knee: He has good range of motion of the right knee joint on examination.  No warmth or effusion was noted.  Tendinopathy of left rotator cuff: Chronic pain.  He has painful range of motion and stiffness in both shoulders on examination today.  Some tenderness of the left shoulder joint was noted.  Arthritis of right acromioclavicular joint - MRI June 08, 2020 which showed supraspinatus and infra spinatus tendinopathy and acromioclavicular arthritis.  DDD (degenerative disc disease), lumbar - s/p L5-S1 fusion.  Chronic pain.  Painful range of motion of the lumbar spine.  Previous x-ray results reviewed today in the office with the patient.  He may benefit from an updated MRI due to his persistent discomfort.  He is followed by pain management.  Plantar fasciitis of left foot: He presents today with significant discomfort on the plantar aspect of the left foot.  About 2 months ago he purchased new shoes but has not noticed any improvement in his symptoms.  He has tenderness along the plantar fascia of the left foot.  Different treatment options were discussed today in detail.  Offered PT.  Discussed orthotics which can be made by podiatrist in the future.  Also discussed importance of performing stretching exercises and rolling out the plantar fascia.  He requested a cortisone injection today.  He tolerated procedure well.  Procedure note was completed above.  Aftercare was discussed.  He was advised to notify us if his symptoms persist or worsen.  He was advised to monitor his blood glucose and blood pressure closely following the cortisone injection today.  Other medical conditions are listed as follows:   Obstructive sleep apnea  Essential hypertension: BP was 126/85 today.   Type 2 diabetes mellitus with other specified complication, with long-term current use of insulin [facility]): Discussed the importance of close blood glucose monitoring.  Bilateral hearing loss, unspecified hearing loss  type  Vitamin D deficiency: He is taking vitamin D  1000 units daily.  Other fatigue: He continues to experience persistent fatigue on a daily basis.  He has been experiencing increased nocturnal pain and has interrupted sleep at night.  Last night he slept for about 3 hours.  His anxiety has been worsening due to the chronicity and severity of his pain and lack of sleep.  He has an appointment with pain management today at which time he plans on further discussing his current treatment regimen. He tried Lyrica at a low dose in the past but did not notice any improvement and has discontinued.  He may benefit from retrying Lyrica possibly at a higher dose.  He plans on further discussing with his pain management specialist today.  Orders: Orders Placed This Encounter  Procedures   Foot Inj   No orders of the defined types were placed in this encounter.   Follow-Up Instructions: Return in about 5 months (around 09/05/2021) for Rheumatoid arthritis, DDD.   Ofilia Neas, PA-C  Note - This record has been created using Dragon software.  Chart creation errors have been sought, but may not always  have been located. Such creation errors do not reflect on  the standard of medical care.

## 2021-03-29 ENCOUNTER — Other Ambulatory Visit (HOSPITAL_COMMUNITY): Payer: Self-pay

## 2021-03-29 MED ORDER — HYDROCODONE-ACETAMINOPHEN 10-325 MG PO TABS
1.0000 | ORAL_TABLET | ORAL | 0 refills | Status: DC
  Filled 2021-03-29 – 2021-03-30 (×2): qty 50, 10d supply, fill #0

## 2021-03-30 ENCOUNTER — Other Ambulatory Visit (HOSPITAL_COMMUNITY): Payer: Self-pay

## 2021-04-03 ENCOUNTER — Other Ambulatory Visit (HOSPITAL_COMMUNITY): Payer: Self-pay

## 2021-04-07 ENCOUNTER — Ambulatory Visit (INDEPENDENT_AMBULATORY_CARE_PROVIDER_SITE_OTHER): Payer: 59 | Admitting: Physician Assistant

## 2021-04-07 ENCOUNTER — Other Ambulatory Visit (HOSPITAL_COMMUNITY): Payer: Self-pay

## 2021-04-07 ENCOUNTER — Other Ambulatory Visit: Payer: Self-pay

## 2021-04-07 ENCOUNTER — Encounter: Payer: Self-pay | Admitting: Physician Assistant

## 2021-04-07 VITALS — BP 126/85 | HR 75 | Ht 72.0 in | Wt 223.8 lb

## 2021-04-07 DIAGNOSIS — R5383 Other fatigue: Secondary | ICD-10-CM

## 2021-04-07 DIAGNOSIS — M069 Rheumatoid arthritis, unspecified: Secondary | ICD-10-CM | POA: Diagnosis not present

## 2021-04-07 DIAGNOSIS — M19011 Primary osteoarthritis, right shoulder: Secondary | ICD-10-CM

## 2021-04-07 DIAGNOSIS — I10 Essential (primary) hypertension: Secondary | ICD-10-CM

## 2021-04-07 DIAGNOSIS — M542 Cervicalgia: Secondary | ICD-10-CM | POA: Diagnosis not present

## 2021-04-07 DIAGNOSIS — E559 Vitamin D deficiency, unspecified: Secondary | ICD-10-CM

## 2021-04-07 DIAGNOSIS — M7061 Trochanteric bursitis, right hip: Secondary | ICD-10-CM | POA: Diagnosis not present

## 2021-04-07 DIAGNOSIS — H9193 Unspecified hearing loss, bilateral: Secondary | ICD-10-CM

## 2021-04-07 DIAGNOSIS — Z79899 Other long term (current) drug therapy: Secondary | ICD-10-CM | POA: Diagnosis not present

## 2021-04-07 DIAGNOSIS — E1169 Type 2 diabetes mellitus with other specified complication: Secondary | ICD-10-CM

## 2021-04-07 DIAGNOSIS — M67912 Unspecified disorder of synovium and tendon, left shoulder: Secondary | ICD-10-CM

## 2021-04-07 DIAGNOSIS — M7062 Trochanteric bursitis, left hip: Secondary | ICD-10-CM

## 2021-04-07 DIAGNOSIS — M7060 Trochanteric bursitis, unspecified hip: Secondary | ICD-10-CM | POA: Diagnosis not present

## 2021-04-07 DIAGNOSIS — M2241 Chondromalacia patellae, right knee: Secondary | ICD-10-CM | POA: Diagnosis not present

## 2021-04-07 DIAGNOSIS — M0579 Rheumatoid arthritis with rheumatoid factor of multiple sites without organ or systems involvement: Secondary | ICD-10-CM

## 2021-04-07 DIAGNOSIS — M722 Plantar fascial fibromatosis: Secondary | ICD-10-CM

## 2021-04-07 DIAGNOSIS — G4733 Obstructive sleep apnea (adult) (pediatric): Secondary | ICD-10-CM | POA: Diagnosis not present

## 2021-04-07 DIAGNOSIS — Z794 Long term (current) use of insulin: Secondary | ICD-10-CM

## 2021-04-07 DIAGNOSIS — M5136 Other intervertebral disc degeneration, lumbar region: Secondary | ICD-10-CM

## 2021-04-07 DIAGNOSIS — Z6832 Body mass index (BMI) 32.0-32.9, adult: Secondary | ICD-10-CM

## 2021-04-07 MED ORDER — LIDOCAINE HCL 1 % IJ SOLN
0.5000 mL | INTRAMUSCULAR | Status: AC | PRN
  Administered 2021-04-07: .5 mL

## 2021-04-07 MED ORDER — TIZANIDINE HCL 4 MG PO TABS
4.0000 mg | ORAL_TABLET | Freq: Three times a day (TID) | ORAL | 5 refills | Status: AC | PRN
  Filled 2021-04-07: qty 90, 30d supply, fill #0
  Filled 2021-06-15: qty 90, 30d supply, fill #1
  Filled 2021-08-12: qty 90, 30d supply, fill #2
  Filled 2021-09-18 – 2021-09-29 (×2): qty 90, 30d supply, fill #3
  Filled 2021-12-07: qty 90, 30d supply, fill #4

## 2021-04-07 MED ORDER — TRIAMCINOLONE ACETONIDE 40 MG/ML IJ SUSP
20.0000 mg | INTRAMUSCULAR | Status: AC | PRN
  Administered 2021-04-07: 20 mg

## 2021-04-07 MED ORDER — HYDROCODONE-ACETAMINOPHEN 10-325 MG PO TABS
1.0000 | ORAL_TABLET | Freq: Every day | ORAL | 0 refills | Status: DC
  Filled 2021-04-07 – 2021-04-10 (×2): qty 150, 30d supply, fill #0

## 2021-04-10 ENCOUNTER — Other Ambulatory Visit (HOSPITAL_COMMUNITY): Payer: Self-pay

## 2021-04-21 ENCOUNTER — Other Ambulatory Visit: Payer: Self-pay | Admitting: Student in an Organized Health Care Education/Training Program

## 2021-04-21 ENCOUNTER — Other Ambulatory Visit (HOSPITAL_COMMUNITY): Payer: Self-pay

## 2021-04-24 ENCOUNTER — Other Ambulatory Visit: Payer: Self-pay | Admitting: Student in an Organized Health Care Education/Training Program

## 2021-04-24 ENCOUNTER — Other Ambulatory Visit (HOSPITAL_COMMUNITY): Payer: Self-pay

## 2021-04-25 ENCOUNTER — Other Ambulatory Visit (HOSPITAL_COMMUNITY): Payer: Self-pay

## 2021-04-25 MED ORDER — DULOXETINE HCL 60 MG PO CPEP
ORAL_CAPSULE | Freq: Every day | ORAL | 3 refills | Status: DC
Start: 2021-04-25 — End: 2022-05-07
  Filled 2021-04-25: qty 90, 90d supply, fill #0
  Filled 2021-07-18: qty 90, 90d supply, fill #1
  Filled 2021-08-12: qty 90, 90d supply, fill #2
  Filled 2021-10-16: qty 30, 30d supply, fill #2
  Filled 2021-11-15 – 2021-12-13 (×2): qty 30, 30d supply, fill #3
  Filled 2022-01-03 – 2022-01-10 (×2): qty 30, 30d supply, fill #4
  Filled 2022-02-07: qty 30, 30d supply, fill #5
  Filled 2022-03-11: qty 30, 30d supply, fill #6
  Filled 2022-04-10: qty 30, 30d supply, fill #7

## 2021-05-01 ENCOUNTER — Other Ambulatory Visit (HOSPITAL_COMMUNITY): Payer: Self-pay

## 2021-05-03 ENCOUNTER — Other Ambulatory Visit (HOSPITAL_COMMUNITY): Payer: Self-pay

## 2021-05-08 ENCOUNTER — Other Ambulatory Visit (HOSPITAL_COMMUNITY): Payer: Self-pay

## 2021-05-08 ENCOUNTER — Other Ambulatory Visit: Payer: Self-pay | Admitting: Physician Assistant

## 2021-05-08 MED ORDER — HYDROXYCHLOROQUINE SULFATE 200 MG PO TABS
200.0000 mg | ORAL_TABLET | Freq: Two times a day (BID) | ORAL | 0 refills | Status: DC
  Filled 2021-05-08: qty 60, 30d supply, fill #0

## 2021-05-08 NOTE — Telephone Encounter (Signed)
Next Visit: 09/08/2021 ° °Last Visit: 04/07/2021 ° °Labs: 02/03/2021 Glucose is 125.  Rest of CMP WNL.  CBC WNL.  ESR WNL.   ° °Eye exam: No Plaquenil eye exam on file  ° °Current Dose per office note 04/07/2021: Plaquenil 200 mg 1 tablet by mouth twice daily.  ° °DX:Rheumatoid arthritis involving multiple sites with positive rheumatoid factor  ° °Last Fill: 02/03/2021 ° °Patient advised he is due to update his PLQ eye exam. Patient states he has recently relocated and will work on scheduling that.  ° °Okay to refill Plaquenil?  °

## 2021-05-09 ENCOUNTER — Other Ambulatory Visit (HOSPITAL_COMMUNITY): Payer: Self-pay

## 2021-05-09 DIAGNOSIS — M7060 Trochanteric bursitis, unspecified hip: Secondary | ICD-10-CM | POA: Diagnosis not present

## 2021-05-09 DIAGNOSIS — G894 Chronic pain syndrome: Secondary | ICD-10-CM | POA: Diagnosis not present

## 2021-05-09 DIAGNOSIS — M5136 Other intervertebral disc degeneration, lumbar region: Secondary | ICD-10-CM | POA: Diagnosis not present

## 2021-05-09 DIAGNOSIS — M069 Rheumatoid arthritis, unspecified: Secondary | ICD-10-CM | POA: Diagnosis not present

## 2021-05-09 MED ORDER — HYDROCODONE BITARTRATE ER 10 MG PO CP12
1.0000 | ORAL_CAPSULE | Freq: Two times a day (BID) | ORAL | 0 refills | Status: DC
Start: 2021-05-09 — End: 2021-05-31
  Filled 2021-05-09 – 2021-05-10 (×2): qty 60, 30d supply, fill #0

## 2021-05-09 MED ORDER — HYDROCODONE-ACETAMINOPHEN 10-325 MG PO TABS
1.0000 | ORAL_TABLET | ORAL | 0 refills | Status: DC
  Filled 2021-05-09: qty 150, 30d supply, fill #0

## 2021-05-10 ENCOUNTER — Other Ambulatory Visit (HOSPITAL_COMMUNITY): Payer: Self-pay

## 2021-05-16 ENCOUNTER — Other Ambulatory Visit (HOSPITAL_COMMUNITY): Payer: Self-pay

## 2021-05-23 ENCOUNTER — Other Ambulatory Visit (HOSPITAL_COMMUNITY): Payer: Self-pay

## 2021-05-25 ENCOUNTER — Other Ambulatory Visit (HOSPITAL_COMMUNITY): Payer: Self-pay

## 2021-05-26 ENCOUNTER — Other Ambulatory Visit (HOSPITAL_COMMUNITY): Payer: Self-pay

## 2021-05-31 ENCOUNTER — Other Ambulatory Visit (HOSPITAL_COMMUNITY): Payer: Self-pay

## 2021-05-31 MED ORDER — HYDROCODONE BITARTRATE ER 20 MG PO CP12
1.0000 | ORAL_CAPSULE | Freq: Two times a day (BID) | ORAL | 0 refills | Status: DC
Start: 2021-05-31 — End: 2021-06-01
  Filled 2021-05-31: qty 12, 6d supply, fill #0

## 2021-05-31 MED ORDER — HYDROCODONE-ACETAMINOPHEN 10-325 MG PO TABS: 1.0000 | ORAL_TABLET | Freq: Every day | ORAL | 0 refills | Status: DC | PRN

## 2021-05-31 MED ORDER — HYDROCODONE BITARTRATE ER 10 MG PO CP12: 1.0000 | ORAL_CAPSULE | Freq: Two times a day (BID) | ORAL | 0 refills | Status: DC

## 2021-06-01 ENCOUNTER — Other Ambulatory Visit (HOSPITAL_COMMUNITY): Payer: Self-pay

## 2021-06-01 DIAGNOSIS — M2550 Pain in unspecified joint: Secondary | ICD-10-CM | POA: Diagnosis not present

## 2021-06-01 DIAGNOSIS — M5136 Other intervertebral disc degeneration, lumbar region: Secondary | ICD-10-CM | POA: Diagnosis not present

## 2021-06-01 DIAGNOSIS — M069 Rheumatoid arthritis, unspecified: Secondary | ICD-10-CM | POA: Diagnosis not present

## 2021-06-01 DIAGNOSIS — G894 Chronic pain syndrome: Secondary | ICD-10-CM | POA: Diagnosis not present

## 2021-06-01 MED ORDER — HYDROCODONE-ACETAMINOPHEN 10-325 MG PO TABS
1.0000 | ORAL_TABLET | Freq: Every day | ORAL | 0 refills | Status: DC | PRN
Start: 2021-06-02 — End: 2021-08-07
  Filled 2021-06-02: qty 35, 7d supply, fill #0
  Filled 2021-06-02: qty 150, 30d supply, fill #0

## 2021-06-02 ENCOUNTER — Other Ambulatory Visit (HOSPITAL_COMMUNITY): Payer: Self-pay

## 2021-06-05 ENCOUNTER — Other Ambulatory Visit (HOSPITAL_COMMUNITY): Payer: Self-pay

## 2021-06-05 MED ORDER — HYDROCODONE-ACETAMINOPHEN 10-325 MG PO TABS
1.0000 | ORAL_TABLET | Freq: Every day | ORAL | 0 refills | Status: DC
Start: 2021-06-05 — End: 2021-08-07
  Filled 2021-06-05 – 2021-06-06 (×2): qty 180, 30d supply, fill #0

## 2021-06-06 ENCOUNTER — Other Ambulatory Visit (HOSPITAL_COMMUNITY): Payer: Self-pay

## 2021-06-07 ENCOUNTER — Other Ambulatory Visit (HOSPITAL_COMMUNITY): Payer: Self-pay

## 2021-06-15 ENCOUNTER — Other Ambulatory Visit: Payer: Self-pay | Admitting: Physician Assistant

## 2021-06-15 ENCOUNTER — Other Ambulatory Visit (HOSPITAL_COMMUNITY): Payer: Self-pay

## 2021-06-15 MED ORDER — HYDROXYCHLOROQUINE SULFATE 200 MG PO TABS
200.0000 mg | ORAL_TABLET | Freq: Two times a day (BID) | ORAL | 0 refills | Status: DC
  Filled 2021-06-15: qty 60, 30d supply, fill #0

## 2021-06-15 NOTE — Telephone Encounter (Signed)
Next Visit: 09/08/2021   Last Visit: 04/07/2021   Labs: 02/03/2021 Glucose is 125.  Rest of CMP WNL.  CBC WNL.  ESR WNL.     Eye exam: No Plaquenil eye exam on file    Current Dose per office note 04/07/2021: Plaquenil 200 mg 1 tablet by mouth twice daily.    TC:CEQFDVOUZH arthritis involving multiple sites with positive rheumatoid factor    Last Fill: 05/08/2021 (30 day supply)   Patient advised he is due to update his PLQ eye exam. Patient states he has recently relocated and will work on scheduling that.    Okay to refill Plaquenil?

## 2021-06-16 DIAGNOSIS — M25511 Pain in right shoulder: Secondary | ICD-10-CM | POA: Diagnosis not present

## 2021-06-16 DIAGNOSIS — M9903 Segmental and somatic dysfunction of lumbar region: Secondary | ICD-10-CM | POA: Diagnosis not present

## 2021-06-16 DIAGNOSIS — M9902 Segmental and somatic dysfunction of thoracic region: Secondary | ICD-10-CM | POA: Diagnosis not present

## 2021-06-16 DIAGNOSIS — M9901 Segmental and somatic dysfunction of cervical region: Secondary | ICD-10-CM | POA: Diagnosis not present

## 2021-06-19 ENCOUNTER — Other Ambulatory Visit (HOSPITAL_COMMUNITY): Payer: Self-pay

## 2021-06-19 DIAGNOSIS — Z79899 Other long term (current) drug therapy: Secondary | ICD-10-CM | POA: Diagnosis not present

## 2021-06-19 DIAGNOSIS — H524 Presbyopia: Secondary | ICD-10-CM | POA: Diagnosis not present

## 2021-06-19 DIAGNOSIS — H52223 Regular astigmatism, bilateral: Secondary | ICD-10-CM | POA: Diagnosis not present

## 2021-06-19 DIAGNOSIS — H5212 Myopia, left eye: Secondary | ICD-10-CM | POA: Diagnosis not present

## 2021-06-19 DIAGNOSIS — E119 Type 2 diabetes mellitus without complications: Secondary | ICD-10-CM | POA: Diagnosis not present

## 2021-06-19 LAB — HM DIABETES EYE EXAM

## 2021-06-20 DIAGNOSIS — M9901 Segmental and somatic dysfunction of cervical region: Secondary | ICD-10-CM | POA: Diagnosis not present

## 2021-06-20 DIAGNOSIS — M9903 Segmental and somatic dysfunction of lumbar region: Secondary | ICD-10-CM | POA: Diagnosis not present

## 2021-06-20 DIAGNOSIS — M25511 Pain in right shoulder: Secondary | ICD-10-CM | POA: Diagnosis not present

## 2021-06-20 DIAGNOSIS — M9902 Segmental and somatic dysfunction of thoracic region: Secondary | ICD-10-CM | POA: Diagnosis not present

## 2021-06-22 ENCOUNTER — Ambulatory Visit: Payer: Self-pay

## 2021-06-22 ENCOUNTER — Ambulatory Visit (INDEPENDENT_AMBULATORY_CARE_PROVIDER_SITE_OTHER): Payer: 59 | Admitting: Family Medicine

## 2021-06-22 ENCOUNTER — Other Ambulatory Visit: Payer: Self-pay

## 2021-06-22 VITALS — BP 156/98 | HR 69 | Ht 72.0 in | Wt 224.6 lb

## 2021-06-22 DIAGNOSIS — M62838 Other muscle spasm: Secondary | ICD-10-CM

## 2021-06-22 NOTE — Progress Notes (Signed)
? ?  I, Wendy Poet, LAT, ATC, am serving as scribe for Dr. [name]. ? ?Adam [name] is a 51 y.o. male who presents to [facility] today for f/u of chronic R shoulder and neck pain.  He was last seen by Dr. Georgina Snell on 11/11/20 for B hip pain and prior to that on 09/06/20 for f/u of R shoulder pain and upper trap pain.  He then had a R distal clavicle excision w/ Dr. Marlou Sa on 09/16/20.  Today, pt reports R shoulder and neck pain that has flared again over the past 1-2 months. Pt locates pain R trapz and into R-side of his neck.  ? ?Diagnostic testing: R shoulder MRI- 06/07/20; R shoulder XR- 03/23/20 ? ?Pertinent review of systems: No fevers or chills ? ?Relevant historical information: Hypertension.  Rheumatoid arthritis. ? ? ?Exam:  ?BP (!) 156/98   Pulse 69   Ht 6' (1.829 m)   Wt 224 lb 9.6 oz (101.9 kg)   SpO2 98%   BMI 30.46 kg/m?  ?General: Well Developed, well nourished, and in no acute distress.  ? ?MSK: Right trapezius: Normal-appearing ?Tender palpation at trapezius insertion to scapula at superior medial corner. ?Normal scapular motion. ? ? ? ?Lab and Radiology Results ? ? ?Procedure: Real-time Ultrasound Guided Injection of right trapezius insertion superior medial scapular corner ?Device: Philips Affiniti 50G ?Images permanently stored and available for review in PACS ?Verbal informed consent obtained.  Discussed risks and benefits of procedure. Warned about infection bleeding damage to structures skin hypopigmentation and fat atrophy among others. ?Patient expresses understanding and agreement ?Time-out conducted.   ?Noted no overlying erythema, induration, or other signs of local infection.   ?Skin prepped in a sterile fashion.   ?Local anesthesia: Topical Ethyl chloride.   ?With sterile technique and under real time ultrasound guidance: 40 mg of Kenalog and 2 mg of Marcaine injected into the trapezius insertion to the superior medial corner of the scapula. Fluid  seen entering the insertion site.   ?Completed without difficulty   ?Location of the needle and fluid was seen during the duration of the injection.  It did not extend deep past the scapula. ?Pain immediately resolved suggesting accurate placement of the medication.   ?Advised to call if fevers/chills, erythema, induration, drainage, or persistent bleeding.   ?Images permanently stored and available for review in the ultrasound unit.  ?Impression: Technically successful ultrasound guided injection. ? ? ? ?Assessment and Plan: ?51 y.o. male with right trapezius pain at the scapula insertion. ?This is a recurrence of a chronic issue now.  Plan for steroid injection.  He is receiving chiropractic care.  However if this shot is not long-lasting would recommend physical therapy.  He can let me know and I can refer to PT. ?Recheck as needed. ? ? ?PDMP not reviewed this encounter. ?Orders Placed This Encounter  ?Procedures  ? [place] LIMITED JOINT SPACE STRUCTURES UP RIGHT(NO LINKED CHARGES)  ?  Order Specific Question:   Reason for Exam (SYMPTOM  OR DIAGNOSIS REQUIRED)  ?  Answer:   right trapezius pain  ?  Order Specific Question:   Preferred imaging location?  ?  Answer:   [place]  ? ?No orders of the defined types were placed in this encounter. ? ? ? ?Discussed warning signs or symptoms. Please see discharge instructions. Patient expresses understanding. ? ? ?The above documentation has been reviewed and is accurate and complete [name], M.D. ? ? ?

## 2021-06-22 NOTE — Patient Instructions (Addendum)
Thank you for coming in today.  ? ?You received an injection today. Seek immediate medical attention if the joint becomes red, extremely painful, or is oozing fluid.  ? ?Let me know if not improving ? ?I could refer you to physical therapy. ? ?Recheck back as needed ?

## 2021-06-28 ENCOUNTER — Other Ambulatory Visit (HOSPITAL_COMMUNITY): Payer: Self-pay

## 2021-07-03 ENCOUNTER — Other Ambulatory Visit (HOSPITAL_COMMUNITY): Payer: Self-pay

## 2021-07-03 DIAGNOSIS — Z79891 Long term (current) use of opiate analgesic: Secondary | ICD-10-CM | POA: Diagnosis not present

## 2021-07-03 DIAGNOSIS — M5136 Other intervertebral disc degeneration, lumbar region: Secondary | ICD-10-CM | POA: Diagnosis not present

## 2021-07-03 DIAGNOSIS — F09 Unspecified mental disorder due to known physiological condition: Secondary | ICD-10-CM | POA: Diagnosis not present

## 2021-07-03 DIAGNOSIS — Z981 Arthrodesis status: Secondary | ICD-10-CM | POA: Diagnosis not present

## 2021-07-03 DIAGNOSIS — M25511 Pain in right shoulder: Secondary | ICD-10-CM | POA: Diagnosis not present

## 2021-07-03 DIAGNOSIS — M542 Cervicalgia: Secondary | ICD-10-CM | POA: Diagnosis not present

## 2021-07-03 MED ORDER — PREGABALIN 50 MG PO CAPS
ORAL_CAPSULE | ORAL | 0 refills | Status: DC
  Filled 2021-07-03: qty 90, 37d supply, fill #0

## 2021-07-03 MED ORDER — OXYCODONE-ACETAMINOPHEN 10-325 MG PO TABS
1.0000 | ORAL_TABLET | Freq: Four times a day (QID) | ORAL | 0 refills | Status: DC | PRN
Start: 2021-07-03 — End: 2021-07-03
  Filled 2021-07-03: qty 120, 30d supply, fill #0

## 2021-07-03 MED ORDER — OXYCODONE-ACETAMINOPHEN 10-325 MG PO TABS
1.0000 | ORAL_TABLET | Freq: Four times a day (QID) | ORAL | 0 refills | Status: DC | PRN
  Filled 2021-07-03 – 2021-07-06 (×2): qty 120, 30d supply, fill #0

## 2021-07-06 ENCOUNTER — Other Ambulatory Visit (HOSPITAL_COMMUNITY): Payer: Self-pay

## 2021-07-17 ENCOUNTER — Other Ambulatory Visit: Payer: Self-pay | Admitting: Student in an Organized Health Care Education/Training Program

## 2021-07-17 ENCOUNTER — Other Ambulatory Visit (HOSPITAL_COMMUNITY): Payer: Self-pay

## 2021-07-17 DIAGNOSIS — I10 Essential (primary) hypertension: Secondary | ICD-10-CM

## 2021-07-18 ENCOUNTER — Other Ambulatory Visit (HOSPITAL_COMMUNITY): Payer: Self-pay

## 2021-07-18 MED ORDER — HYDROCHLOROTHIAZIDE 25 MG PO TABS
ORAL_TABLET | Freq: Every day | ORAL | 2 refills | Status: DC
  Filled 2021-07-18: qty 90, 90d supply, fill #0

## 2021-07-18 MED ORDER — LOSARTAN POTASSIUM 100 MG PO TABS
ORAL_TABLET | Freq: Every day | ORAL | 3 refills | Status: DC
Start: 2021-07-18 — End: 2022-08-03
  Filled 2021-07-18: qty 90, 90d supply, fill #0
  Filled 2021-08-12 – 2021-09-18 (×2): qty 90, 90d supply, fill #1
  Filled 2021-10-16: qty 30, 30d supply, fill #1
  Filled 2021-11-14 – 2021-12-07 (×2): qty 30, 30d supply, fill #2
  Filled 2022-01-03: qty 30, 30d supply, fill #3
  Filled 2022-02-07: qty 30, 30d supply, fill #4
  Filled 2022-03-11: qty 30, 30d supply, fill #5
  Filled 2022-04-10: qty 30, 30d supply, fill #6
  Filled 2022-05-07 – 2022-05-10 (×2): qty 30, 30d supply, fill #7
  Filled 2022-06-09: qty 30, 30d supply, fill #8
  Filled 2022-07-09: qty 30, 30d supply, fill #9

## 2021-07-18 MED ORDER — AMLODIPINE BESYLATE 5 MG PO TABS
ORAL_TABLET | Freq: Every day | ORAL | 2 refills | Status: DC
  Filled 2021-07-18: qty 90, 90d supply, fill #0

## 2021-07-21 ENCOUNTER — Other Ambulatory Visit (HOSPITAL_COMMUNITY): Payer: Self-pay

## 2021-07-24 ENCOUNTER — Other Ambulatory Visit (HOSPITAL_COMMUNITY): Payer: Self-pay

## 2021-07-24 ENCOUNTER — Other Ambulatory Visit: Payer: Self-pay

## 2021-07-24 MED ORDER — FREESTYLE LITE TEST VI STRP
ORAL_STRIP | 12 refills | Status: DC
  Filled 2021-07-24: qty 100, 33d supply, fill #0
  Filled 2021-08-21: qty 100, 33d supply, fill #1
  Filled 2021-09-22: qty 100, 33d supply, fill #2

## 2021-07-24 NOTE — Telephone Encounter (Signed)
Call from patient would like to get refill on his Free Style Lite testing strips. Stated not having problem with sugars.  Just moved and noticed that he does not have any more strips or refills for. ?

## 2021-07-24 NOTE — Telephone Encounter (Signed)
Requesting to speak with a nurse about getting refill on test strips. Please call pt back.  ?

## 2021-07-25 ENCOUNTER — Other Ambulatory Visit (HOSPITAL_COMMUNITY): Payer: Self-pay

## 2021-07-26 ENCOUNTER — Other Ambulatory Visit (HOSPITAL_COMMUNITY): Payer: Self-pay

## 2021-07-26 ENCOUNTER — Other Ambulatory Visit: Payer: Self-pay | Admitting: Student in an Organized Health Care Education/Training Program

## 2021-07-26 NOTE — Telephone Encounter (Signed)
Next appt scheduled 09/25/21 with PCP. ?

## 2021-07-27 ENCOUNTER — Other Ambulatory Visit (HOSPITAL_COMMUNITY): Payer: Self-pay

## 2021-07-27 MED FILL — Dapagliflozin Propanediol Tab 5 MG (Base Equivalent): ORAL | 90 days supply | Qty: 90 | Fill #0 | Status: AC

## 2021-08-07 ENCOUNTER — Ambulatory Visit (INDEPENDENT_AMBULATORY_CARE_PROVIDER_SITE_OTHER): Payer: 59 | Admitting: Student

## 2021-08-07 ENCOUNTER — Encounter: Payer: Self-pay | Admitting: Student

## 2021-08-07 ENCOUNTER — Other Ambulatory Visit (HOSPITAL_COMMUNITY): Payer: Self-pay

## 2021-08-07 VITALS — BP 140/80 | HR 74 | Temp 98.1°F | Ht 72.0 in | Wt 223.2 lb

## 2021-08-07 DIAGNOSIS — Z0000 Encounter for general adult medical examination without abnormal findings: Secondary | ICD-10-CM

## 2021-08-07 DIAGNOSIS — F411 Generalized anxiety disorder: Secondary | ICD-10-CM | POA: Diagnosis not present

## 2021-08-07 DIAGNOSIS — I10 Essential (primary) hypertension: Secondary | ICD-10-CM

## 2021-08-07 DIAGNOSIS — E1169 Type 2 diabetes mellitus with other specified complication: Secondary | ICD-10-CM

## 2021-08-07 DIAGNOSIS — Z1211 Encounter for screening for malignant neoplasm of colon: Secondary | ICD-10-CM

## 2021-08-07 DIAGNOSIS — F419 Anxiety disorder, unspecified: Secondary | ICD-10-CM | POA: Insufficient documentation

## 2021-08-07 DIAGNOSIS — Z794 Long term (current) use of insulin: Secondary | ICD-10-CM | POA: Diagnosis not present

## 2021-08-07 DIAGNOSIS — G4700 Insomnia, unspecified: Secondary | ICD-10-CM | POA: Diagnosis not present

## 2021-08-07 LAB — GLUCOSE, CAPILLARY: Glucose-Capillary: 199 mg/dL — ABNORMAL HIGH (ref 70–99)

## 2021-08-07 LAB — POCT GLYCOSYLATED HEMOGLOBIN (HGB A1C): Hemoglobin A1C: 7.5 % — AB (ref 4.0–5.6)

## 2021-08-07 MED ORDER — AMLODIPINE BESYLATE 5 MG PO TABS: ORAL_TABLET | Freq: Every day | ORAL | 2 refills | Status: AC

## 2021-08-07 MED ORDER — DAPAGLIFLOZIN PROPANEDIOL 10 MG PO TABS
10.0000 mg | ORAL_TABLET | Freq: Every day | ORAL | 3 refills | Status: DC
  Filled 2021-08-07 – 2021-08-08 (×2): qty 90, 90d supply, fill #0
  Filled 2021-08-09: qty 60, 60d supply, fill #0
  Filled 2021-08-10 – 2021-08-11 (×2): qty 90, 90d supply, fill #0
  Filled 2021-11-06: qty 90, 90d supply, fill #1
  Filled 2021-11-10 – 2021-12-12 (×3): qty 30, 30d supply, fill #1
  Filled 2022-01-05: qty 30, 30d supply, fill #2
  Filled 2022-02-05: qty 30, 30d supply, fill #3
  Filled 2022-03-06: qty 30, 30d supply, fill #4
  Filled 2022-04-06: qty 30, 30d supply, fill #5
  Filled 2022-05-07 – 2022-05-10 (×4): qty 30, 30d supply, fill #6
  Filled 2022-06-05: qty 30, 30d supply, fill #7
  Filled 2022-07-02: qty 30, 30d supply, fill #8
  Filled 2022-08-01: qty 30, 30d supply, fill #9

## 2021-08-07 MED ORDER — TRAZODONE HCL 50 MG PO TABS
50.0000 mg | ORAL_TABLET | Freq: Every day | ORAL | 0 refills | Status: DC
  Filled 2021-08-07: qty 30, 30d supply, fill #0

## 2021-08-07 NOTE — Assessment & Plan Note (Signed)
Blood pressure slightly elevated today at 148/84.  He is only taking losartan 100 mg.  He has stopped amlodipine and HCTZ a few months ago due to feeling dizzy with positional change.  Said the symptom has resolved after holding amlodipine and HCTZ. ? ?-Continue losartan 100 mg ?-Resume amlodipine 5 mg.  Advised patient to call clinic if he develops symptoms of orthostatic hypotension ?-Check BMP ? ?

## 2021-08-07 NOTE — Patient Instructions (Addendum)
Adam [name], ? ?It was a pleasure seeing you in the clinic today.  Here is a summary of what we talked about: ? ?1.  Your blood pressure slightly elevated today.  Please resume amlodipine 5 mg and continue losartan 100 mg daily.  Please let us know if you develop symptoms of dizziness with standing up.  I will check your kidney function today. ? ?2.  Your A1c went up from 6.5 to 7.5.  Please cut back on sugary food.  I will increase the Farxiga to 10 mg daily.  Continue metformin. ? ?3.  I started trazodone 50 mg to help you sleep.  Please continue duloxetine for your mood and chronic back pain. ? ?Please return in 4 weeks ? ?Take care, ? ?Dr. Alfonse Spruce ?

## 2021-08-07 NOTE — Progress Notes (Signed)
? ?CC: F/u on hypertension, diabetes and anxiety ? ?HPI: ? ?[name] is a 51 y.o. with past medical history of hypertension, type 2 diabetes, OSA who presents to clinic to follow-up on his anxiety and insomnia. ? ?Please see problem based charting for detail ? ?Past Medical History:  ?Diagnosis Date  ? Anxiety   ? Arthritis   ? lower back, hands  ? Depression   ? Deviated septum   ? nasal turbinate hypertrophy  ? Hypertension   ? states under control with meds., has been on medication since age 40  ? Insulin dependent diabetes mellitus   ? type 2 DM  ? Morbid obesity ([place])   ? Sleep apnea   ? uses CPAP "most of the time", per pt.  ? ?Review of Systems:  per HPI ? ?Physical Exam: ? ?Vitals:  ? 08/07/21 1001 08/07/21 1041  ?BP: (!) 148/84 140/80  ?Pulse: 79 74  ?Temp: 98.1 ?F (36.7 ?C)   ?TempSrc: Oral   ?SpO2: 98%   ?Weight: 223 lb 3.2 oz (101.2 kg)   ?Height: 6' (1.829 m)   ? ?Physical Exam ?Constitutional:   ?   General: He is not in acute distress. ?   Appearance: He is not ill-appearing.  ?HENT:  ?   Head: Normocephalic.  ?Eyes:  ?   General:     ?   Right eye: No discharge.     ?   Left eye: No discharge.  ?   Conjunctiva/sclera: Conjunctivae normal.  ?Cardiovascular:  ?   Rate and Rhythm: Normal rate and regular rhythm.  ?Pulmonary:  ?   Effort: Pulmonary effort is normal.  ?   Breath sounds: Normal breath sounds.  ?Abdominal:  ?   Palpations: Abdomen is soft.  ?Musculoskeletal:     ?   General: Normal range of motion.  ?Skin: ?   General: Skin is warm.  ?Neurological:  ?   General: No focal deficit present.  ?   Mental Status: He is alert.  ?Psychiatric:     ?   Mood and Affect: Mood normal.  ?  ? ?Assessment & Plan:  ? ?See Encounters Tab for problem based charting. ? ?Essential hypertension ?Blood pressure slightly elevated today at 148/84.  He is only taking losartan 100 mg.  He has stopped amlodipine and HCTZ a few months ago due to feeling dizzy with positional change.  Said the symptom has  resolved after holding amlodipine and HCTZ. ? ?-Continue losartan 100 mg ?-Resume amlodipine 5 mg.  Advised patient to call clinic if he develops symptoms of orthostatic hypotension ?-Check BMP ? ? ?Type 2 diabetes mellitus with other specified complication ([place]) ?A1c increased from 6.5 to 7.5.  He endorses eating more sugary food.  His weight has been stable.  Report adherence to his medication. ? ?Home regimen include Farxiga 5 mg and metformin 1 g twice daily.  He did not tolerate GLP-1 in the past. ? ?-Increase Farxiga to 10 mg ?-Continue metformin 1 g twice daily ?-Has had an eye exam 2 months ago ?-Foot exam benign ? ?Anxiety disorder ?Patient reports worsening anxiety disorder that started last week.  Patient was on chronic opioid with Norco and was switched to Percocet a month ago.  He has tapered off Percocet slowly over the last month and last pill was taken 5 days ago.  He has not had issue with opioid withdrawal before.  Denies tremors, agitation or GI side effects.  His COWS score was 2 which  did not indicate active withdrawal. ? ?His GAD-7 score was 13 indicates moderate anxiety.  He contributed the anxiety to his shoulder pain and not be able to sleep.  Michela Pitcher that is difficult for him to go to sleep given excessive thoughts at night.  He has tried melatonin without help.  He also tried over-the-counter ZzzQuil but could not tolerate.  States that he was on trazodone 50 mg a long time ago. ? ?Endorses good sleep hygiene.  Reports no TV, sound or lights or coffee/tea/stimulants before sleep. ? ?I am not sure his anxiety symptoms are from opioid withdrawal or from GAD.  Fortunately, patient has tapered him off slowly.  Physical exam does not support acute opioid withdrawal.  He is already on duloxetine 60 mg which should help with with his anxiety.  Will start trazodone to help with his sleep and reassess his symptoms in a few weeks. ? ?-Start trazodone 50 mg at bedtime.  Advised patient to start with 1/2  tablet and titrate up as needed ?-Counseled patient on symptoms of serotonin syndrome.   ?-Continue duloxetine 60 mg ? ?Healthcare maintenance ?- Order FOBT  ? ?Patient discussed with Dr. Dareen Piano  ?

## 2021-08-07 NOTE — Assessment & Plan Note (Signed)
-   Order FOBT ?

## 2021-08-07 NOTE — Assessment & Plan Note (Signed)
Patient reports worsening anxiety disorder that started last week.  Patient was on chronic opioid with Norco and was switched to Percocet a month ago.  He has tapered off Percocet slowly over the last month and last pill was taken 5 days ago.  He has not had issue with opioid withdrawal before.  Denies tremors, agitation or GI side effects.  His COWS score was 2 which did not indicate active withdrawal. ? ?His GAD-7 score was 13 indicates moderate anxiety.  He contributed the anxiety to his shoulder pain and not be able to sleep.  Michela Pitcher that is difficult for him to go to sleep given excessive thoughts at night.  He has tried melatonin without help.  He also tried over-the-counter ZzzQuil but could not tolerate.  States that he was on trazodone 50 mg a long time ago. ? ?Endorses good sleep hygiene.  Reports no TV, sound or lights or coffee/tea/stimulants before sleep. ? ?I am not sure his anxiety symptoms are from opioid withdrawal or from GAD.  Fortunately, patient has tapered him off slowly.  Physical exam does not support acute opioid withdrawal.  He is already on duloxetine 60 mg which should help with with his anxiety.  Will start trazodone to help with his sleep and reassess his symptoms in a few weeks. ? ?-Start trazodone 50 mg at bedtime.  Advised patient to start with 1/2 tablet and titrate up as needed ?-Counseled patient on symptoms of serotonin syndrome.   ?-Continue duloxetine 60 mg ?

## 2021-08-07 NOTE — Assessment & Plan Note (Signed)
A1c increased from 6.5 to 7.5.  He endorses eating more sugary food.  His weight has been stable.  Report adherence to his medication. ? ?Home regimen include Farxiga 5 mg and metformin 1 g twice daily.  He did not tolerate GLP-1 in the past. ? ?-Increase Farxiga to 10 mg ?-Continue metformin 1 g twice daily ?-Has had an eye exam 2 months ago ?-Foot exam benign ?

## 2021-08-08 ENCOUNTER — Other Ambulatory Visit (HOSPITAL_COMMUNITY): Payer: Self-pay

## 2021-08-08 LAB — BMP8+ANION GAP
Anion Gap: 18 mmol/L (ref 10.0–18.0)
BUN/Creatinine Ratio: 17 (ref 9–20)
BUN: 15 mg/dL (ref 6–24)
CO2: 21 mmol/L (ref 20–29)
Calcium: 10 mg/dL (ref 8.7–10.2)
Chloride: 102 mmol/L (ref 96–106)
Creatinine, Ser: 0.89 mg/dL (ref 0.76–1.27)
Glucose: 200 mg/dL — ABNORMAL HIGH (ref 70–99)
Potassium: 4.3 mmol/L (ref 3.5–5.2)
Sodium: 141 mmol/L (ref 134–144)
eGFR: 103 mL/min/{1.73_m2} (ref >59)

## 2021-08-08 LAB — CBC
Hematocrit: 46.8 % (ref 37.5–51.0)
Hemoglobin: 16.3 g/dL (ref 13.0–17.7)
MCH: 30.2 pg (ref 26.6–33.0)
MCHC: 34.8 g/dL (ref 31.5–35.7)
MCV: 87 fL (ref 79–97)
Platelets: 242 10*3/uL (ref 150–450)
RBC: 5.4 x10E6/uL (ref 4.14–5.80)
RDW: 12.5 % (ref 11.6–15.4)
WBC: 6.5 10*3/uL (ref 3.4–10.8)

## 2021-08-09 ENCOUNTER — Other Ambulatory Visit (HOSPITAL_COMMUNITY): Payer: Self-pay

## 2021-08-09 NOTE — Progress Notes (Signed)
Internal Medicine Clinic Attending  Case discussed with Dr. Nguyen  At the time of the visit.  We reviewed the resident's history and exam and pertinent patient test results.  I agree with the assessment, diagnosis, and plan of care documented in the resident's note. 

## 2021-08-10 ENCOUNTER — Other Ambulatory Visit (HOSPITAL_COMMUNITY): Payer: Self-pay

## 2021-08-11 ENCOUNTER — Other Ambulatory Visit (HOSPITAL_COMMUNITY): Payer: Self-pay

## 2021-08-12 ENCOUNTER — Other Ambulatory Visit (HOSPITAL_COMMUNITY): Payer: Self-pay

## 2021-08-12 ENCOUNTER — Other Ambulatory Visit: Payer: Self-pay | Admitting: Student in an Organized Health Care Education/Training Program

## 2021-08-12 ENCOUNTER — Other Ambulatory Visit: Payer: Self-pay | Admitting: Physician Assistant

## 2021-08-14 ENCOUNTER — Other Ambulatory Visit (HOSPITAL_BASED_OUTPATIENT_CLINIC_OR_DEPARTMENT_OTHER): Payer: Self-pay

## 2021-08-14 ENCOUNTER — Other Ambulatory Visit (HOSPITAL_COMMUNITY): Payer: Self-pay

## 2021-08-14 MED ORDER — VITAMIN D 25 MCG (1000 UNIT) PO TABS
1000.0000 [IU] | ORAL_TABLET | Freq: Every day | ORAL | 1 refills | Status: AC
  Filled 2021-08-14 – 2022-03-11 (×3): qty 90, 90d supply, fill #0
  Filled 2022-05-10 – 2022-08-08 (×2): qty 30, 30d supply, fill #0

## 2021-08-14 MED ORDER — HYDROXYCHLOROQUINE SULFATE 200 MG PO TABS
200.0000 mg | ORAL_TABLET | Freq: Two times a day (BID) | ORAL | 0 refills | Status: DC
  Filled 2021-08-14: qty 60, 30d supply, fill #0

## 2021-08-14 NOTE — Telephone Encounter (Signed)
Next Visit: 09/08/2021 ?  ?Last Visit: 04/07/2021 ?  ?Labs: 08/07/2021 Glucose 200,  ?  ?Eye exam: No Plaquenil eye exam on file  ?  ?Current Dose per office note 04/07/2021: Plaquenil 200 mg 1 tablet by mouth twice daily.  ?  ?LF:YBOFBPZWCH arthritis involving multiple sites with positive rheumatoid factor  ?  ?Last Fill: 06/15/2021 (30 day supply) ? ?Patient advised he is due to update PLQ eye exam. Patient states he has had the exam and  will contact the eye doctor and have them send results.  ? ?Okay to refill PLQ?  ?

## 2021-08-14 NOTE — Telephone Encounter (Signed)
Please clarify where the exam exam was performed so we can call to obtain these records urgently.  No eye examination is on file.

## 2021-08-15 ENCOUNTER — Other Ambulatory Visit (HOSPITAL_COMMUNITY): Payer: Self-pay

## 2021-08-16 ENCOUNTER — Encounter: Payer: Self-pay | Admitting: Dietician

## 2021-08-16 DIAGNOSIS — M47816 Spondylosis without myelopathy or radiculopathy, lumbar region: Secondary | ICD-10-CM | POA: Diagnosis not present

## 2021-08-16 DIAGNOSIS — M47817 Spondylosis without myelopathy or radiculopathy, lumbosacral region: Secondary | ICD-10-CM | POA: Diagnosis not present

## 2021-08-16 DIAGNOSIS — Z79891 Long term (current) use of opiate analgesic: Secondary | ICD-10-CM | POA: Diagnosis not present

## 2021-08-16 DIAGNOSIS — M47812 Spondylosis without myelopathy or radiculopathy, cervical region: Secondary | ICD-10-CM | POA: Diagnosis not present

## 2021-08-16 DIAGNOSIS — M461 Sacroiliitis, not elsewhere classified: Secondary | ICD-10-CM | POA: Diagnosis not present

## 2021-08-16 DIAGNOSIS — G894 Chronic pain syndrome: Secondary | ICD-10-CM | POA: Diagnosis not present

## 2021-08-18 ENCOUNTER — Other Ambulatory Visit (HOSPITAL_COMMUNITY): Payer: Self-pay

## 2021-08-21 ENCOUNTER — Other Ambulatory Visit (HOSPITAL_COMMUNITY): Payer: Self-pay

## 2021-08-23 ENCOUNTER — Other Ambulatory Visit: Payer: Self-pay

## 2021-08-23 ENCOUNTER — Other Ambulatory Visit (HOSPITAL_COMMUNITY): Payer: Self-pay

## 2021-08-24 ENCOUNTER — Other Ambulatory Visit (HOSPITAL_COMMUNITY): Payer: Self-pay

## 2021-08-25 ENCOUNTER — Other Ambulatory Visit (HOSPITAL_COMMUNITY): Payer: Self-pay

## 2021-08-25 NOTE — Progress Notes (Deleted)
Office Visit Note  Patient: Adam [name]             Date of Birth: Jan 15, 1971           MRN: [id number]             PCP: Axel Filler, MD Referring: Axel Filler,* Visit Date: 09/08/2021 Occupation: '@GUAROCC'$ @  Subjective:  No chief complaint on file.   History of Present Illness: Adam [name] is a 51 y.o. male ***   Activities of Daily Living:  Patient reports morning stiffness for *** {minute/hour:19697}.   Patient {ACTIONS;DENIES/REPORTS:21021675::"Denies"} nocturnal pain.  Difficulty dressing/grooming: {ACTIONS;DENIES/REPORTS:21021675::"Denies"} Difficulty climbing stairs: {ACTIONS;DENIES/REPORTS:21021675::"Denies"} Difficulty getting out of chair: {ACTIONS;DENIES/REPORTS:21021675::"Denies"} Difficulty using hands for taps, buttons, cutlery, and/or writing: {ACTIONS;DENIES/REPORTS:21021675::"Denies"}  No Rheumatology ROS completed.   PMFS History:  Patient Active Problem List   Diagnosis Date Noted   Anxiety disorder 08/07/2021   AC (acromioclavicular) arthritis 06/09/2020   Tendinitis of right shoulder 04/26/2020   Acromioclavicular sprain, right, initial encounter 03/23/2020   Nonallopathic lesion of cervical region 03/03/2020   Nonallopathic lesion of lumbar region 03/03/2020   Nonallopathic lesion of sacral region 03/03/2020   Neck pain 03/03/2020   Alcohol use disorder, in early remission ([place]) 01/18/2020   Rheumatoid arthritis ([place]) 11/10/2018   Hearing loss 11/12/2016   Obesity, Class III, BMI 40-49.9 (morbid obesity) ([place]) 11/12/2016   Osteoarthritis of lumbar spine 04/09/2016   Obstructive sleep apnea 02/28/2015   Type 2 diabetes mellitus with other specified complication ([place]) 87/56/4332   Erectile dysfunction 01/06/2015   Healthcare maintenance 01/06/2015   Essential hypertension 01/06/2015    Past Medical History:  Diagnosis Date   Anxiety    Arthritis    lower back, hands   Depression    Deviated septum    nasal  turbinate hypertrophy   Hypertension    states under control with meds., has been on medication since age 40   Insulin dependent diabetes mellitus    type 2 DM   Morbid obesity ([place])    Sleep apnea    uses CPAP "most of the time", per pt.    Family History  Problem Relation Age of Onset   Hyperlipidemia Mother    Aneurysm Mother    Hypertension Mother    Diabetes Father    Hyperlipidemia Father    Hypertension Father    Healthy Daughter    Healthy Daughter    Past Surgical History:  Procedure Laterality Date   CARPAL TUNNEL RELEASE Right 07/2014   LAPAROSCOPIC GASTRIC SLEEVE RESECTION     Dr. Excell Seltzer 09-09-17   LAPAROSCOPIC GASTRIC SLEEVE RESECTION N/A 09/09/2017   Procedure: LAPAROSCOPIC GASTRIC SLEEVE RESECTION  AND ERAS PATHWAY;  Surgeon: Excell Seltzer, MD;  Location: [facility];  Service: General;  Laterality: N/A;   LUMBAR FUSION     NASAL SEPTOPLASTY W/ TURBINOPLASTY Bilateral 03/22/2017   Procedure: NASAL SEPTOPLASTY WITH  BILATERAL INFERIOR   TURBINATE REDUCTION;  Surgeon: Jerrell Belfast, MD;  Location: [facility];  Service: ENT;  Laterality: Bilateral;   SHOULDER ARTHROSCOPY Left    x 2   SHOULDER ARTHROSCOPY WITH DISTAL CLAVICLE RESECTION Right 09/16/2020   Procedure: RIGHT SHOULDER ARTHROSCOPY WITH ARTHROSCOPIC DISTAL CLAVICLE EXCISION;  Surgeon: Meredith Pel, MD;  Location: [facility];  Service: Orthopedics;  Laterality: Right;   TONSILLECTOMY AND ADENOIDECTOMY     TYMPANOSTOMY TUBE PLACEMENT Bilateral    WISDOM TOOTH EXTRACTION     Social History   Social History Narrative   Not on file  Immunization History  Administered Date(s) Administered   Influenza Inj Mdck Quad Pf 01/10/2018   Influenza,inj,Quad PF,6+ Mos 01/06/2015, 01/07/2017, 01/22/2019, 02/01/2020, 02/13/2021   Influenza-Unspecified 03/10/2016   PFIZER(Purple Top)SARS-COV-2 Vaccination 07/04/2019, 07/28/2019, 07/05/2020   Pneumococcal Polysaccharide-23 01/06/2015   Td 07/11/2020   Tdap  12/09/2010     Objective: Vital Signs: There were no vitals taken for this visit.   Physical Exam   Musculoskeletal Exam: ***  CDAI Exam: CDAI Score: -- Patient Global: --; Provider Global: -- Swollen: --; Tender: -- Joint Exam 09/08/2021   No joint exam has been documented for this visit   There is currently no information documented on the homunculus. Go to the Rheumatology activity and complete the homunculus joint exam.  Investigation: No additional findings.  Imaging: No results found.  Recent Labs: Lab Results  Component Value Date   WBC 6.5 08/07/2021   HGB 16.3 08/07/2021   PLT 242 08/07/2021   NA 141 08/07/2021   K 4.3 08/07/2021   CL 102 08/07/2021   CO2 21 08/07/2021   GLUCOSE 200 (H) 08/07/2021   BUN 15 08/07/2021   CREATININE 0.89 08/07/2021   BILITOT 0.4 02/03/2021   ALKPHOS 95 01/18/2020   AST 19 02/03/2021   ALT 18 02/03/2021   PROT 6.6 02/03/2021   ALBUMIN 4.7 01/18/2020   CALCIUM 10.0 08/07/2021   GFRAA 104 07/26/2020   QFTBGOLDPLUS NEGATIVE 12/25/2018    Speciality Comments: No specialty comments available.  Procedures:  No procedures performed Allergies: Chlorhexidine   Assessment / Plan:     Visit Diagnoses: No diagnosis found.  Orders: No orders of the defined types were placed in this encounter.  No orders of the defined types were placed in this encounter.   Face-to-face time spent with patient was *** minutes. Greater than 50% of time was spent in counseling and coordination of care.  Follow-Up Instructions: No follow-ups on file.   Earnestine Mealing, CMA  Note - This record has been created using Editor, commissioning.  Chart creation errors have been sought, but may not always  have been located. Such creation errors do not reflect on  the standard of medical care.

## 2021-08-28 ENCOUNTER — Other Ambulatory Visit: Payer: Self-pay

## 2021-08-28 ENCOUNTER — Other Ambulatory Visit (HOSPITAL_COMMUNITY): Payer: Self-pay

## 2021-08-30 ENCOUNTER — Other Ambulatory Visit (HOSPITAL_COMMUNITY): Payer: Self-pay

## 2021-08-30 ENCOUNTER — Other Ambulatory Visit: Payer: Self-pay | Admitting: Student

## 2021-08-30 DIAGNOSIS — Z981 Arthrodesis status: Secondary | ICD-10-CM | POA: Diagnosis not present

## 2021-08-30 DIAGNOSIS — M5136 Other intervertebral disc degeneration, lumbar region: Secondary | ICD-10-CM | POA: Diagnosis not present

## 2021-08-30 DIAGNOSIS — G894 Chronic pain syndrome: Secondary | ICD-10-CM | POA: Diagnosis not present

## 2021-08-30 DIAGNOSIS — M5416 Radiculopathy, lumbar region: Secondary | ICD-10-CM | POA: Diagnosis not present

## 2021-08-30 DIAGNOSIS — Z79891 Long term (current) use of opiate analgesic: Secondary | ICD-10-CM | POA: Diagnosis not present

## 2021-08-30 DIAGNOSIS — M542 Cervicalgia: Secondary | ICD-10-CM | POA: Diagnosis not present

## 2021-08-30 DIAGNOSIS — G4700 Insomnia, unspecified: Secondary | ICD-10-CM

## 2021-08-30 MED ORDER — TRAZODONE HCL 50 MG PO TABS
50.0000 mg | ORAL_TABLET | Freq: Every day | ORAL | 2 refills | Status: DC
  Filled 2021-08-30: qty 30, 30d supply, fill #0
  Filled 2021-09-30 – 2021-10-14 (×3): qty 30, 30d supply, fill #1
  Filled 2021-11-10 – 2021-11-11 (×2): qty 30, 30d supply, fill #2

## 2021-08-31 ENCOUNTER — Other Ambulatory Visit (HOSPITAL_COMMUNITY): Payer: Self-pay

## 2021-09-01 ENCOUNTER — Other Ambulatory Visit (HOSPITAL_COMMUNITY): Payer: Self-pay

## 2021-09-01 ENCOUNTER — Other Ambulatory Visit: Payer: Self-pay | Admitting: Nurse Practitioner

## 2021-09-01 ENCOUNTER — Other Ambulatory Visit: Payer: Self-pay

## 2021-09-01 ENCOUNTER — Ambulatory Visit
Admission: RE | Admit: 2021-09-01 | Discharge: 2021-09-01 | Disposition: A | Discharge status: Home / Self Care | Payer: 59 | Source: Ambulatory Visit | Attending: Nurse Practitioner | Admitting: Nurse Practitioner

## 2021-09-01 DIAGNOSIS — M5416 Radiculopathy, lumbar region: Secondary | ICD-10-CM

## 2021-09-01 DIAGNOSIS — M5126 Other intervertebral disc displacement, lumbar region: Secondary | ICD-10-CM | POA: Diagnosis not present

## 2021-09-01 DIAGNOSIS — M47816 Spondylosis without myelopathy or radiculopathy, lumbar region: Secondary | ICD-10-CM | POA: Diagnosis not present

## 2021-09-04 ENCOUNTER — Other Ambulatory Visit (HOSPITAL_COMMUNITY): Payer: Self-pay

## 2021-09-04 ENCOUNTER — Encounter: Payer: 59 | Admitting: Internal Medicine

## 2021-09-04 MED ORDER — PREGABALIN 50 MG PO CAPS
50.0000 mg | ORAL_CAPSULE | Freq: Three times a day (TID) | ORAL | 1 refills | Status: DC
  Filled 2021-09-04: qty 90, 30d supply, fill #0

## 2021-09-05 ENCOUNTER — Telehealth: Payer: Self-pay | Admitting: Rheumatology

## 2021-09-05 NOTE — Telephone Encounter (Signed)
Patient advised Dr. Estanislado Pandy recommends the practice in [place] [facility]-Dr. Allayne Butcher. ?

## 2021-09-05 NOTE — Telephone Encounter (Signed)
Please advise practice in [place] [facility]-Dr. Allayne Butcher.

## 2021-09-05 NOTE — Telephone Encounter (Signed)
Patient called the office to cancel upcoming appointment. Patient states he has moved to [place] and would like to know if Dr. Estanislado Pandy has any recommendations for any rheumatologists in [place].  ?

## 2021-09-06 ENCOUNTER — Encounter: Payer: Self-pay | Admitting: Student in an Organized Health Care Education/Training Program

## 2021-09-07 ENCOUNTER — Other Ambulatory Visit (HOSPITAL_COMMUNITY): Payer: Self-pay

## 2021-09-08 ENCOUNTER — Ambulatory Visit: Payer: 59 | Admitting: Rheumatology

## 2021-09-08 DIAGNOSIS — Z79899 Other long term (current) drug therapy: Secondary | ICD-10-CM

## 2021-09-08 DIAGNOSIS — I10 Essential (primary) hypertension: Secondary | ICD-10-CM

## 2021-09-08 DIAGNOSIS — M19011 Primary osteoarthritis, right shoulder: Secondary | ICD-10-CM

## 2021-09-08 DIAGNOSIS — M5136 Other intervertebral disc degeneration, lumbar region: Secondary | ICD-10-CM

## 2021-09-08 DIAGNOSIS — E559 Vitamin D deficiency, unspecified: Secondary | ICD-10-CM

## 2021-09-08 DIAGNOSIS — M7061 Trochanteric bursitis, right hip: Secondary | ICD-10-CM

## 2021-09-08 DIAGNOSIS — M67912 Unspecified disorder of synovium and tendon, left shoulder: Secondary | ICD-10-CM

## 2021-09-08 DIAGNOSIS — H9193 Unspecified hearing loss, bilateral: Secondary | ICD-10-CM

## 2021-09-08 DIAGNOSIS — M0579 Rheumatoid arthritis with rheumatoid factor of multiple sites without organ or systems involvement: Secondary | ICD-10-CM

## 2021-09-08 DIAGNOSIS — M2241 Chondromalacia patellae, right knee: Secondary | ICD-10-CM

## 2021-09-08 DIAGNOSIS — R5383 Other fatigue: Secondary | ICD-10-CM

## 2021-09-08 DIAGNOSIS — M722 Plantar fascial fibromatosis: Secondary | ICD-10-CM

## 2021-09-08 DIAGNOSIS — Z794 Long term (current) use of insulin: Secondary | ICD-10-CM

## 2021-09-08 DIAGNOSIS — G4733 Obstructive sleep apnea (adult) (pediatric): Secondary | ICD-10-CM

## 2021-09-11 ENCOUNTER — Other Ambulatory Visit (HOSPITAL_COMMUNITY): Payer: Self-pay

## 2021-09-13 ENCOUNTER — Other Ambulatory Visit (HOSPITAL_COMMUNITY): Payer: Self-pay

## 2021-09-13 DIAGNOSIS — G894 Chronic pain syndrome: Secondary | ICD-10-CM | POA: Diagnosis not present

## 2021-09-13 DIAGNOSIS — M47816 Spondylosis without myelopathy or radiculopathy, lumbar region: Secondary | ICD-10-CM | POA: Diagnosis not present

## 2021-09-13 DIAGNOSIS — M461 Sacroiliitis, not elsewhere classified: Secondary | ICD-10-CM | POA: Diagnosis not present

## 2021-09-13 DIAGNOSIS — M47812 Spondylosis without myelopathy or radiculopathy, cervical region: Secondary | ICD-10-CM | POA: Diagnosis not present

## 2021-09-13 DIAGNOSIS — Z79891 Long term (current) use of opiate analgesic: Secondary | ICD-10-CM | POA: Diagnosis not present

## 2021-09-14 ENCOUNTER — Other Ambulatory Visit (HOSPITAL_COMMUNITY): Payer: Self-pay

## 2021-09-18 ENCOUNTER — Other Ambulatory Visit: Payer: Self-pay | Admitting: Physician Assistant

## 2021-09-19 ENCOUNTER — Other Ambulatory Visit (HOSPITAL_COMMUNITY): Payer: Self-pay

## 2021-09-22 ENCOUNTER — Other Ambulatory Visit (HOSPITAL_COMMUNITY): Payer: Self-pay

## 2021-09-25 ENCOUNTER — Ambulatory Visit: Payer: 59 | Admitting: Student in an Organized Health Care Education/Training Program

## 2021-09-25 ENCOUNTER — Encounter: Payer: Self-pay | Admitting: Student in an Organized Health Care Education/Training Program

## 2021-09-25 VITALS — BP 131/78 | HR 74 | Temp 98.1°F | Ht 72.0 in | Wt 230.1 lb

## 2021-09-25 DIAGNOSIS — Z6841 Body Mass Index (BMI) 40.0 and over, adult: Secondary | ICD-10-CM

## 2021-09-25 DIAGNOSIS — M19011 Primary osteoarthritis, right shoulder: Secondary | ICD-10-CM

## 2021-09-25 DIAGNOSIS — M05741 Rheumatoid arthritis with rheumatoid factor of right hand without organ or systems involvement: Secondary | ICD-10-CM

## 2021-09-25 DIAGNOSIS — Z0000 Encounter for general adult medical examination without abnormal findings: Secondary | ICD-10-CM

## 2021-09-25 DIAGNOSIS — F411 Generalized anxiety disorder: Secondary | ICD-10-CM | POA: Diagnosis not present

## 2021-09-25 DIAGNOSIS — F1021 Alcohol dependence, in remission: Secondary | ICD-10-CM | POA: Diagnosis not present

## 2021-09-25 DIAGNOSIS — Z7984 Long term (current) use of oral hypoglycemic drugs: Secondary | ICD-10-CM

## 2021-09-25 DIAGNOSIS — I10 Essential (primary) hypertension: Secondary | ICD-10-CM | POA: Diagnosis not present

## 2021-09-25 DIAGNOSIS — F17220 Nicotine dependence, chewing tobacco, uncomplicated: Secondary | ICD-10-CM

## 2021-09-25 DIAGNOSIS — Z794 Long term (current) use of insulin: Secondary | ICD-10-CM | POA: Diagnosis not present

## 2021-09-25 DIAGNOSIS — Z1211 Encounter for screening for malignant neoplasm of colon: Secondary | ICD-10-CM

## 2021-09-25 DIAGNOSIS — E1169 Type 2 diabetes mellitus with other specified complication: Secondary | ICD-10-CM

## 2021-09-25 LAB — GLUCOSE, CAPILLARY: Glucose-Capillary: 170 mg/dL — ABNORMAL HIGH (ref 70–99)

## 2021-09-25 LAB — POCT GLYCOSYLATED HEMOGLOBIN (HGB A1C): Hemoglobin A1C: 8.3 % — AB (ref 4.0–5.6)

## 2021-09-25 MED ORDER — HYDROXYZINE HCL 10 MG PO TABS: 10.0000 mg | ORAL_TABLET | Freq: Three times a day (TID) | ORAL | 2 refills | Status: DC | PRN

## 2021-09-25 NOTE — Assessment & Plan Note (Signed)
Patient returned completed FOBT to lab today.  Plan - Follow-up results

## 2021-09-25 NOTE — Assessment & Plan Note (Signed)
Patient says his rheumatoid arthritis is well-controlled. He is taking Plaquenil. He says he has a flare every 2-3 years, and this usually involves pain in his hand. He has not had any hand pain or any symptoms for several months. He believes alcohol cessation 2 years ago has helped. On exam, no noticeable changes. His insurance changed on June 1st, and he will look for a new rheumatologist in [place]. Told patient we are happy to assist and send a referral if he needs one, but he says Dr. Estanislado Pandy has given him the name of a rheumatologist to contact. Will continue to monitor.

## 2021-09-25 NOTE — Assessment & Plan Note (Signed)
Blood pressure is 131/78. Patient reports he has been taking the amlodipine and losartan. He denies any lightheadedness or side effects.  Plan - Continue losartan '100mg'$  daily - Continue amlodipine '5mg'$  daily

## 2021-09-25 NOTE — Assessment & Plan Note (Signed)
Patient says he has not consumed alcohol in 2 years. In setting of recent stressors, patient says he has considered drinking alcohol, but he is still choosing to not partake.

## 2021-09-25 NOTE — Assessment & Plan Note (Addendum)
At previous visit, A1c increased from 6.5 to 7.5. Patient says he has been taking [place] and Metformin as prescribed. He denies symptoms of hypoglycemia, diarrhea, or other medication side effects. We will repeat A1c today.  Plan - Follow-up A1c results - Continue Metformin '1000mg'$  daily - Continue Farxiga '10mg'$  daily

## 2021-09-25 NOTE — Patient Instructions (Addendum)
It was very nice to see you in clinic today. Here are a few reminders from what we discussed:  We will check a few labs today. We will check your thyroid to make sure it is not causing some of the symptoms you have been experiencing. Your blood counts looked normal when we checked in April, so we will not repeat this today. We plan to start you on a medication called hydroxyzine for the anxiety. This is a medication you can take as needed if you feel very anxious or feel like a panic attack may be coming. We will check your A1c today to make sure your diabetes is being managed well, and we will contact you with results. Attending therapy seems like a great idea for you! As you look for a therapist, please let us know if there is anything we can do to assist you. Also, let us know if we can assist as you look for a rheumatologist in [place].   We hope to see you again in clinic in 3 months. If you have any questions or concerns before your next appointment, please do not hesitate to let us know.

## 2021-09-25 NOTE — Assessment & Plan Note (Signed)
Patient has gained 5lbs and is at 230lbs with BMI of 31.21. He has done well with weight loss, and goal remains 220lbs. He will continue with lifestyle modifications, and we will continue to monitor.

## 2021-09-25 NOTE — Progress Notes (Signed)
Attestation for Student Documentation:  I personally was present and performed or re-performed the history, physical exam and medical decision-making activities of this service and have verified that the service and findings are accurately documented in the student's note.  51 year old person here for follow up of diabetes and has concerns about anxiety and depression. He reports good adherence with medications. He has a lot of stress going on right now with work and family responsibilities, he is spread thin. His GAD and PHQ9 scores are reasonable, showing mild symptom burden. Will check TSH to rule out hypothyroidism, CBC 6 weeks ago was normal so I doubt anemia. Will continue with cymbalta which co-treats chronic pain, continue with as needed trazodone for insomnia, and will add as needed hydroxyzine for days when anxiety and panic are impacting his work. A1c is a little worse at 8.3%, will continue with metformin and farxiga for now given all the changes he has had recently. If A1c continues to be elevated above 8%, we may try a weekly GLP1 agonist again or try terzepatide.   Axel Filler, MD 09/25/2021, 11:50 AM

## 2021-09-25 NOTE — Assessment & Plan Note (Addendum)
Patient says the acromioclavicular arthritis and sternoclavicular arthritis have been stable. Mild crepitus noted on exam of right shoulder, and mild tenderness over bony changes at sternoclavicular joint. He says he occasionally feels pressure above the sternoclavicular joint near the right thyroid lower lobe, but there is no pain. He goes to the [facility], and he is prescribed oxycodone for the chronic pain. Patient is managing well at this time, so no changes being made today.

## 2021-09-25 NOTE — Assessment & Plan Note (Addendum)
Patient reports increased anxiety recently. He says his job is high stress, and recent changes, such as his wife becoming a travel nurse, have been stressful. Generally, he has less interest in what previously interested him and believes this is impacting his close relationships. 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 He has been taking the trazodone a few times a week since his visit < 2 months ago, but it makes him feel groggy in the morning. GAD Screening score today was 8, and PHQ9 score was 4. This is slightly improved from previous GAD score of 13. Expressed symptoms more consistent with anxiety and denied any feelings of failure or desire for self harm. He says he has no difficulty completing tasks at work. He tries to manage his stress by making time to walk, and he expressed he has no desire to return to drinking alcohol after he has not been drinking any for the past 2 years. We discussed possible medical causes of some of the mood changes. He is not anemic, which was evidenced by labs drawn at a visit < 2 months ago, but we will check a TSH today. Patient requested a testosterone level check, but told him this is unlikely to help guide management. He believes he would benefit from a medication that could help him when he gets too ramped up or is about to have a panic attack. He also believes he could benefit from talking about some of the stress with a therapist. Encouraged him to pursue therapy and suggested he try hydroxyzine.   Plan - Start hydroxyzine '10mg'$  TID as needed - Continue Duloxetine '60mg'$  - Follow-up TSH results

## 2021-09-25 NOTE — Progress Notes (Signed)
Subjective:   Patient ID: Adam [name] male   DOB: August 18, 1970 51 y.o.   MRN: [id number]  HPI: Adam [name] is a 51 y.o. living with HTN, anxiety, osteoarthritis, rheumatoid arthritis, and TIIDM who presents for routine follow-up and to discuss anxiety and depression. Please see problem-based charting for full assessment and plan.  Past Medical History:  Diagnosis Date   Anxiety    Arthritis    lower back, hands   Depression    Deviated septum    nasal turbinate hypertrophy   Hypertension    states under control with meds., has been on medication since age 45   Insulin dependent diabetes mellitus    type 2 DM   Morbid obesity ([place])    Sleep apnea    uses CPAP "most of the time", per pt.   Current Outpatient Medications  Medication Sig Dispense Refill   amLODipine (NORVASC) 5 MG tablet Take 1 tablet by mouth daily. 90 tablet 2   cholecalciferol (VITAMIN D3) 25 MCG (1000 UNIT) tablet Take 1 tablet (1,000 Units total) by mouth daily. 90 tablet 1   dapagliflozin propanediol (FARXIGA) 10 MG TABS tablet Take 1 tablet (10 mg total) by mouth daily before breakfast. 90 tablet 3   DULoxetine (CYMBALTA) 60 MG capsule TAKE 1 CAPSULE BY MOUTH ONCE A DAY 90 capsule 3   hydroxychloroquine (PLAQUENIL) 200 MG tablet Take 1 tablet (200 mg total) by mouth 2 (two) times daily. 60 tablet 0   hydrOXYzine (ATARAX) 10 MG tablet Take 1 tablet (10 mg total) by mouth 3 (three) times daily as needed. 30 tablet 2   losartan (COZAAR) 100 MG tablet TAKE 1 TABLET BY MOUTH DAILY. 90 tablet 3   metFORMIN (GLUCOPHAGE) 1000 MG tablet Take 1 tablet by mouth twice daily with a meal. 180 tablet 3   simvastatin (ZOCOR) 20 MG tablet TAKE 1 TABLET BY MOUTH ONCE DAILY 90 tablet 3   tadalafil (CIALIS) 20 MG tablet TAKE 1 TABLET BY MOUTH DAILY AS NEEDED FOR ERECTILE DYSFUNCTION. 30 tablet 2   tiZANidine (ZANAFLEX) 4 MG tablet Take 1 tablet (4 mg total) by mouth 3 (three) times daily as needed. 90 tablet 5    traZODone (DESYREL) 50 MG tablet Take 1 tablet (50 mg total) by mouth at bedtime. 30 tablet 2   pregabalin (LYRICA) 50 MG capsule Take 1 capsule (50 mg total) by mouth 3 (three) times daily as directed (Patient not taking: Reported on 09/25/2021) 90 capsule 1   No current facility-administered medications for this visit.   Family History  Problem Relation Age of Onset   Hyperlipidemia Mother    Aneurysm Mother    Hypertension Mother    Diabetes Father    Hyperlipidemia Father    Hypertension Father    Healthy Daughter    Healthy Daughter    Social History   Socioeconomic History   Marital status: Married    Spouse name: Not on file   Number of children: Not on file   Years of education: Not on file   Highest education level: Not on file  Occupational History   Not on file  Tobacco Use   Smoking status: Never   Smokeless tobacco: Current    Types: Chew  Vaping Use   Vaping Use: Never used  Substance and Sexual Activity   Alcohol use: Not Currently   Drug use: No   Sexual activity: Yes  Other Topics Concern   Not on file  Social History Narrative  Not on file   Social Determinants of Health   Financial Resource Strain: Not on file  Food Insecurity: Not on file  Transportation Needs: Not on file  Physical Activity: Not on file  Stress: Not on file  Social Connections: Not on file   Review of Systems: Pertinent items are noted in HPI. Objective:  Physical Exam: Vitals:   09/25/21 1012  BP: 131/78  Pulse: 74  Temp: 98.1 F (36.7 C)  TempSrc: Oral  SpO2: 99%  Weight: 230 lb 1.6 oz (104.4 kg)  Height: 6' (1.829 m)   BP 131/78 (BP Location: Right Arm, Patient Position: Sitting, Cuff Size: Small)   Pulse 74   Temp 98.1 F (36.7 C) (Oral)   Ht 6' (1.829 m)   Wt 230 lb 1.6 oz (104.4 kg)   SpO2 99%   BMI 31.21 kg/m   General Appearance:    Alert, cooperative, no distress  Head:    Normocephalic, atraumatic  Neck:   Supple, symmetrical, trachea midline,  no adenopathy;       thyroid:  No enlargement/tenderness/nodules  Lungs:     Clear to auscultation bilaterally, respirations unlabored  Chest wall:    No deformity, small joint space at the sternoclavicular joint on R side with minimal tenderness  Heart:    Regular rate and rhythm, S1 and S2 normal, no murmur, rub , or gallop  Abdomen:     Soft, non-tender, bowel sounds active all four quadrants  Extremities:   Mild crepitus of R upper extremity with passive ROM, extremities atraumatic, no BLE edema   Assessment & Plan:   See encounters tab for problem-based charting.  Essential hypertension Blood pressure is 131/78. Patient reports he has been taking the amlodipine and losartan. He denies any lightheadedness or side effects.  Plan - Continue losartan '100mg'$  daily - Continue amlodipine '5mg'$  daily  Type 2 diabetes mellitus with other specified complication ([place]) At previous visit, A1c increased from 6.5 to 7.5. Patient says he has been taking [place] and Metformin as prescribed. He denies symptoms of hypoglycemia, diarrhea, or other medication side effects. We will repeat A1c today.  Plan - Follow-up A1c results - Continue Metformin '1000mg'$  daily - Continue Farxiga '10mg'$  daily  Rheumatoid arthritis ([place]) Patient says his rheumatoid arthritis is well-controlled. He is taking Plaquenil. He says he has a flare every 2-3 years, and this usually involves pain in his hand. He has not had any hand pain or any symptoms for several months. He believes alcohol cessation 2 years ago has helped. On exam, no noticeable changes. His insurance changed on June 1st, and he will look for a new rheumatologist in [place]. Told patient we are happy to assist and send a referral if he needs one, but he says Dr. Estanislado Pandy has given him the name of a rheumatologist to contact. Will continue to monitor.  AC (acromioclavicular) arthritis Patient says the acromioclavicular arthritis and sternoclavicular arthritis  have been stable. Mild crepitus noted on exam of right shoulder, and mild tenderness over bony changes at sternoclavicular joint. He says he occasionally feels pressure above the sternoclavicular joint near the right thyroid lower lobe, but there is no pain. He goes to the [facility], and he is prescribed oxycodone for the chronic pain. Patient is managing well at this time, so no changes being made today.  Anxiety disorder Patient reports increased anxiety recently. He says his job is high stress, and recent changes, such as his wife becoming a travel Marine scientist, have  been stressful. Generally, he has less interest in what previously interested him and believes this is impacting his close relationships. 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 He has been taking the trazodone a few times a week since his visit < 2 months ago, but it makes him feel groggy in the morning. GAD Screening score today was 8, and PHQ9 score was 4. This is slightly improved from previous GAD score of 13. Expressed symptoms more consistent with anxiety and denied any feelings of failure or desire for self harm. He says he has no difficulty completing tasks at work. He tries to manage his stress by making time to walk, and he expressed he has no desire to return to drinking alcohol after he has not been drinking any for the past 2 years. We discussed possible medical causes of some of the mood changes. He is not anemic, which was evidenced by labs drawn at a visit < 2 months ago, but we will check a TSH today. Patient requested a testosterone level check, but told him this is unlikely to help guide management. He believes he would benefit from a medication that could help him when he gets too ramped up or is about to have a panic attack. He also believes he could benefit from talking about some of the stress with a therapist. Encouraged  him to pursue therapy and suggested he try hydroxyzine.   Plan - Start hydroxyzine '10mg'$  TID as needed - Continue Duloxetine '60mg'$  - Follow-up TSH results  Alcohol use disorder, in early remission [facility]) Patient says he has not consumed alcohol in 2 years. In setting of recent stressors, patient says he has considered drinking alcohol, but he is still choosing to not partake.  Obesity, Class III, BMI 40-49.9 (morbid obesity) ([place]) Patient has gained 5lbs and is at 230lbs with BMI of 31.21. He has done well with weight loss, and goal remains 220lbs. He will continue with lifestyle modifications, and we will continue to monitor.  Healthcare maintenance Patient returned completed FOBT to lab today.  Plan - Follow-up results

## 2021-09-26 LAB — TSH: TSH: 0.689 u[IU]/mL (ref 0.450–4.500)

## 2021-09-27 ENCOUNTER — Other Ambulatory Visit (HOSPITAL_COMMUNITY): Payer: Self-pay

## 2021-09-27 ENCOUNTER — Telehealth: Payer: Self-pay | Admitting: Student in an Organized Health Care Education/Training Program

## 2021-09-27 DIAGNOSIS — Z1211 Encounter for screening for malignant neoplasm of colon: Secondary | ICD-10-CM

## 2021-09-27 LAB — FECAL OCCULT BLOOD, IMMUNOCHEMICAL: Fecal Occult Bld: POSITIVE — AB

## 2021-09-27 NOTE — Telephone Encounter (Signed)
I spoke with the patient about the positive FIT. We talked about risk of colon cancer, he is a little higher than average risk given this positive result. Most likely he has a polyp. He has not had a colonoscopy in the past, is agreeable to it, I will place the referral.

## 2021-09-29 ENCOUNTER — Other Ambulatory Visit (HOSPITAL_COMMUNITY): Payer: Self-pay

## 2021-09-29 DIAGNOSIS — Z79891 Long term (current) use of opiate analgesic: Secondary | ICD-10-CM | POA: Diagnosis not present

## 2021-09-29 DIAGNOSIS — M5116 Intervertebral disc disorders with radiculopathy, lumbar region: Secondary | ICD-10-CM | POA: Diagnosis not present

## 2021-09-29 DIAGNOSIS — M545 Low back pain, unspecified: Secondary | ICD-10-CM | POA: Diagnosis not present

## 2021-09-29 DIAGNOSIS — M961 Postlaminectomy syndrome, not elsewhere classified: Secondary | ICD-10-CM | POA: Diagnosis not present

## 2021-09-29 MED ORDER — HYDROCODONE-ACETAMINOPHEN 10-325 MG PO TABS
1.0000 | ORAL_TABLET | Freq: Four times a day (QID) | ORAL | 0 refills | Status: DC
  Filled 2021-09-29 (×2): qty 120, 30d supply, fill #0

## 2021-09-30 ENCOUNTER — Other Ambulatory Visit (HOSPITAL_COMMUNITY): Payer: Self-pay

## 2021-10-02 ENCOUNTER — Other Ambulatory Visit (HOSPITAL_COMMUNITY): Payer: Self-pay

## 2021-10-03 ENCOUNTER — Other Ambulatory Visit (HOSPITAL_COMMUNITY): Payer: Self-pay

## 2021-10-13 ENCOUNTER — Other Ambulatory Visit (HOSPITAL_COMMUNITY): Payer: Self-pay

## 2021-10-13 MED ORDER — OXYCODONE-ACETAMINOPHEN 10-325 MG PO TABS
1.0000 | ORAL_TABLET | Freq: Every day | ORAL | 0 refills | Status: AC
  Filled 2021-10-13: qty 9, 9d supply, fill #0
  Filled 2021-10-13: qty 1, 1d supply, fill #0

## 2021-10-14 ENCOUNTER — Other Ambulatory Visit (HOSPITAL_COMMUNITY): Payer: Self-pay

## 2021-10-16 ENCOUNTER — Other Ambulatory Visit: Payer: Self-pay | Admitting: Student in an Organized Health Care Education/Training Program

## 2021-10-16 ENCOUNTER — Other Ambulatory Visit (HOSPITAL_COMMUNITY): Payer: Self-pay

## 2021-10-17 ENCOUNTER — Other Ambulatory Visit (HOSPITAL_COMMUNITY): Payer: Self-pay

## 2021-10-17 MED ORDER — METFORMIN HCL 1000 MG PO TABS
ORAL_TABLET | Freq: Two times a day (BID) | ORAL | 3 refills | Status: AC
  Filled 2021-10-17: qty 60, 30d supply, fill #0
  Filled 2021-11-14 – 2021-12-07 (×2): qty 60, 30d supply, fill #1
  Filled 2022-01-03 (×3): qty 60, 30d supply, fill #0
  Filled 2022-02-07: qty 60, 30d supply, fill #1
  Filled 2022-03-11: qty 60, 30d supply, fill #2
  Filled 2022-04-10: qty 60, 30d supply, fill #3
  Filled 2022-05-07 – 2022-05-10 (×2): qty 60, 30d supply, fill #4
  Filled 2022-06-09: qty 60, 30d supply, fill #5
  Filled 2022-07-09: qty 60, 30d supply, fill #6
  Filled 2022-08-08: qty 60, 30d supply, fill #7
  Filled 2022-09-06: qty 60, 30d supply, fill #8
  Filled 2022-10-03: qty 60, 30d supply, fill #9

## 2021-10-18 ENCOUNTER — Other Ambulatory Visit (HOSPITAL_COMMUNITY): Payer: Self-pay

## 2021-10-27 ENCOUNTER — Other Ambulatory Visit (HOSPITAL_COMMUNITY): Payer: Self-pay

## 2021-10-27 MED ORDER — HYDROCODONE-ACETAMINOPHEN 10-325 MG PO TABS
1.0000 | ORAL_TABLET | Freq: Four times a day (QID) | ORAL | 0 refills | Status: AC
  Filled 2021-10-27: qty 120, 30d supply, fill #0

## 2021-11-02 ENCOUNTER — Other Ambulatory Visit (HOSPITAL_COMMUNITY): Payer: Self-pay

## 2021-11-03 ENCOUNTER — Other Ambulatory Visit (HOSPITAL_COMMUNITY): Payer: Self-pay

## 2021-11-10 ENCOUNTER — Other Ambulatory Visit (HOSPITAL_COMMUNITY): Payer: Self-pay

## 2021-11-11 ENCOUNTER — Other Ambulatory Visit (HOSPITAL_COMMUNITY): Payer: Self-pay

## 2021-11-13 ENCOUNTER — Other Ambulatory Visit (HOSPITAL_COMMUNITY): Payer: Self-pay

## 2021-11-14 ENCOUNTER — Other Ambulatory Visit (HOSPITAL_COMMUNITY): Payer: Self-pay

## 2021-11-15 ENCOUNTER — Other Ambulatory Visit (HOSPITAL_COMMUNITY): Payer: Self-pay

## 2021-11-20 ENCOUNTER — Encounter: Payer: Self-pay | Admitting: Gastroenterology

## 2021-11-20 ENCOUNTER — Other Ambulatory Visit (HOSPITAL_COMMUNITY): Payer: Self-pay

## 2021-11-21 ENCOUNTER — Telehealth: Payer: Self-pay | Admitting: *Deleted

## 2021-11-21 NOTE — Telephone Encounter (Signed)
John-please review anesthesia record from 2019 and 2022 procedures. 2019 record states difficult airway while 2022 anesthesia record does not mention any issues. Please advise on whether this pt is appropriate for [facility]?  Thank you,  Lattie Haw PV

## 2021-11-22 ENCOUNTER — Other Ambulatory Visit (HOSPITAL_COMMUNITY): Payer: Self-pay

## 2021-11-22 NOTE — Telephone Encounter (Signed)
Lattie Haw,  He had an uncomplicated intubation with a basic laryngoscope in 2022 so he is cleared for [place].  Thanks much,  Osvaldo Angst

## 2021-11-23 ENCOUNTER — Other Ambulatory Visit (HOSPITAL_COMMUNITY): Payer: Self-pay

## 2021-11-24 ENCOUNTER — Other Ambulatory Visit (HOSPITAL_COMMUNITY): Payer: Self-pay

## 2021-11-30 ENCOUNTER — Other Ambulatory Visit (HOSPITAL_COMMUNITY): Payer: Self-pay

## 2021-11-30 ENCOUNTER — Other Ambulatory Visit: Payer: Self-pay | Admitting: Student in an Organized Health Care Education/Training Program

## 2021-11-30 DIAGNOSIS — N529 Male erectile dysfunction, unspecified: Secondary | ICD-10-CM

## 2021-11-30 MED ORDER — TADALAFIL 20 MG PO TABS
ORAL_TABLET | ORAL | 2 refills | Status: DC
  Filled 2022-03-11: qty 30, 30d supply, fill #0
  Filled 2022-04-10: qty 30, 30d supply, fill #1
  Filled 2022-06-09: qty 30, 30d supply, fill #2

## 2021-12-07 ENCOUNTER — Other Ambulatory Visit (HOSPITAL_COMMUNITY): Payer: Self-pay

## 2021-12-07 ENCOUNTER — Other Ambulatory Visit: Payer: Self-pay | Admitting: Student in an Organized Health Care Education/Training Program

## 2021-12-07 MED ORDER — SIMVASTATIN 20 MG PO TABS
20.0000 mg | ORAL_TABLET | Freq: Every day | ORAL | 3 refills | Status: AC
  Filled 2021-12-07: qty 30, 30d supply, fill #0
  Filled 2022-01-03: qty 30, 30d supply, fill #1
  Filled 2022-02-07: qty 30, 30d supply, fill #2
  Filled 2022-03-11: qty 30, 30d supply, fill #3
  Filled 2022-04-10: qty 30, 30d supply, fill #4
  Filled 2022-05-07: qty 30, 30d supply, fill #5
  Filled 2022-06-09: qty 30, 30d supply, fill #6
  Filled 2022-07-09: qty 30, 30d supply, fill #7
  Filled 2022-08-08: qty 30, 30d supply, fill #8
  Filled 2022-09-06: qty 30, 30d supply, fill #9
  Filled 2022-10-03: qty 30, 30d supply, fill #10
  Filled 2022-11-04: qty 30, 30d supply, fill #11

## 2021-12-08 ENCOUNTER — Other Ambulatory Visit (HOSPITAL_COMMUNITY): Payer: Self-pay

## 2021-12-11 ENCOUNTER — Other Ambulatory Visit: Payer: Self-pay | Admitting: Student in an Organized Health Care Education/Training Program

## 2021-12-11 ENCOUNTER — Other Ambulatory Visit (HOSPITAL_COMMUNITY): Payer: Self-pay

## 2021-12-11 DIAGNOSIS — G4700 Insomnia, unspecified: Secondary | ICD-10-CM

## 2021-12-11 MED ORDER — TRAZODONE HCL 50 MG PO TABS
50.0000 mg | ORAL_TABLET | Freq: Every day | ORAL | 2 refills | Status: DC
  Filled 2021-12-11: qty 30, 30d supply, fill #0
  Filled 2022-01-09: qty 30, 30d supply, fill #1
  Filled 2022-02-05: qty 30, 30d supply, fill #2

## 2021-12-12 ENCOUNTER — Other Ambulatory Visit (HOSPITAL_COMMUNITY): Payer: Self-pay

## 2021-12-13 ENCOUNTER — Other Ambulatory Visit (HOSPITAL_COMMUNITY): Payer: Self-pay

## 2021-12-18 ENCOUNTER — Ambulatory Visit (AMBULATORY_SURGERY_CENTER): Payer: 59

## 2021-12-18 VITALS — Ht 72.0 in | Wt 226.0 lb

## 2021-12-18 DIAGNOSIS — R195 Other fecal abnormalities: Secondary | ICD-10-CM

## 2021-12-18 MED ORDER — NA SULFATE-K SULFATE-MG SULF 17.5-3.13-1.6 GM/177ML PO SOLN: 1.0000 | ORAL | 0 refills | Status: DC

## 2021-12-18 NOTE — Progress Notes (Signed)
No egg or soy allergy known to patient  No issues known to pt with past sedation with any surgeries or procedures Patient denies ever being told they had issues or difficulty with intubation  No FH of Malignant Hyperthermia Pt is not on diet pills Pt is not on  home 02  Pt is not on blood thinners  Pt denies issues with constipation  No A fib or A flutter Have any cardiac testing pending--denied Pt instructed to use Singlecare.com or GoodRx for a price reduction on prep   

## 2021-12-24 ENCOUNTER — Encounter: Payer: Self-pay | Admitting: Certified Registered Nurse Anesthetist

## 2022-01-01 ENCOUNTER — Encounter: Payer: Self-pay | Admitting: Gastroenterology

## 2022-01-01 ENCOUNTER — Ambulatory Visit (AMBULATORY_SURGERY_CENTER): Payer: 59 | Admitting: Gastroenterology

## 2022-01-01 VITALS — BP 133/79 | HR 56 | Temp 97.5°F | Resp 16 | Ht 72.0 in | Wt 226.0 lb

## 2022-01-01 DIAGNOSIS — R195 Other fecal abnormalities: Secondary | ICD-10-CM | POA: Diagnosis present

## 2022-01-01 DIAGNOSIS — D123 Benign neoplasm of transverse colon: Secondary | ICD-10-CM

## 2022-01-01 DIAGNOSIS — D128 Benign neoplasm of rectum: Secondary | ICD-10-CM

## 2022-01-01 DIAGNOSIS — D125 Benign neoplasm of sigmoid colon: Secondary | ICD-10-CM

## 2022-01-01 DIAGNOSIS — D124 Benign neoplasm of descending colon: Secondary | ICD-10-CM | POA: Diagnosis not present

## 2022-01-01 MED ORDER — SODIUM CHLORIDE 0.9 % IV SOLN: 500.0000 mL | Freq: Once | INTRAVENOUS | Status: DC

## 2022-01-01 NOTE — Patient Instructions (Signed)
  Handouts on polyps,diverticulosis,& hemorrhoids given to you today  Await pathology results on polyps removed      YOU HAD AN ENDOSCOPIC PROCEDURE TODAY AT THE [facility]:   Refer to the procedure report that was given to you for any specific questions about what was found during the examination.  If the procedure report does not answer your questions, please call your gastroenterologist to clarify.  If you requested that your care partner not be given the details of your procedure findings, then the procedure report has been included in a sealed envelope for you to review at your convenience later.  YOU SHOULD EXPECT: Some feelings of bloating in the abdomen. Passage of more gas than usual.  Walking can help get rid of the air that was put into your GI tract during the procedure and reduce the bloating. If you had a lower endoscopy (such as a colonoscopy or flexible sigmoidoscopy) you may notice spotting of blood in your stool or on the toilet paper. If you underwent a bowel prep for your procedure, you may not have a normal bowel movement for a few days.  Please Note:  You might notice some irritation and congestion in your nose or some drainage.  This is from the oxygen used during your procedure.  There is no need for concern and it should clear up in a day or so.  SYMPTOMS TO REPORT IMMEDIATELY:  Following lower endoscopy (colonoscopy or flexible sigmoidoscopy):  Excessive amounts of blood in the stool  Significant tenderness or worsening of abdominal pains  Swelling of the abdomen that is new, acute  Fever of 100F or higher   For urgent or emergent issues, a gastroenterologist can be reached at any hour by calling [phone]. Do not use MyChart messaging for urgent concerns.    DIET:  We do recommend a small meal at first, but then you may proceed to your regular diet.  Drink plenty of fluids but you should avoid alcoholic beverages for 24 hours.  ACTIVITY:   You should plan to take it easy for the rest of today and you should NOT DRIVE or use heavy machinery until tomorrow (because of the sedation medicines used during the test).    FOLLOW UP: Our staff will call the number listed on your records the next business day following your procedure.  We will call around 7:15- 8:00 am to check on you and address any questions or concerns that you may have regarding the information given to you following your procedure. If we do not reach you, we will leave a message.     If any biopsies were taken you will be contacted by phone or by letter within the next 1-3 weeks.  Please call us at [phone] if you have not heard about the biopsies in 3 weeks.    SIGNATURES/CONFIDENTIALITY: You and/or your care partner have signed paperwork which will be entered into your electronic medical record.  These signatures attest to the fact that that the information above on your After Visit Summary has been reviewed and is understood.  Full responsibility of the confidentiality of this discharge information lies with you and/or your care-partner.  

## 2022-01-01 NOTE — Progress Notes (Signed)
Pt's states no medical or surgical changes since previsit or office visit. 

## 2022-01-01 NOTE — Progress Notes (Signed)
Called to room to assist during endoscopic procedure.  Patient ID and intended procedure confirmed with present staff. Received instructions for my participation in the procedure from the performing physician.  

## 2022-01-01 NOTE — Progress Notes (Signed)
[facility] History and Physical   Primary Care Physician:  Axel Filler, MD   Reason for Procedure:   (+) cologuard  Plan:    colonoscopy     HPI: Adam [name] is a 51 y.o. male  here for colonoscopy - first time exam, (+) cologuard per patient. Patient denies any bowel symptoms at this time. No family history of colon cancer known. Otherwise feels well without any cardiopulmonary symptoms.   I have discussed risks / benefits of anesthesia and endoscopic procedure with Dola Factor and they wish to proceed with the exams as outlined today.    Past Medical History:  Diagnosis Date   Anxiety    Arthritis    lower back, hands   Depression    Deviated septum    nasal turbinate hypertrophy   Hypertension    states under control with meds., has been on medication since age 50   Insulin dependent diabetes mellitus    type 2 DM   Morbid obesity ([place])    Sleep apnea    uses CPAP "most of the time", per pt.    Past Surgical History:  Procedure Laterality Date   CARPAL TUNNEL RELEASE Right 07/2014   LAPAROSCOPIC GASTRIC SLEEVE RESECTION     Dr. Excell Seltzer 09-09-17   LAPAROSCOPIC GASTRIC SLEEVE RESECTION N/A 09/09/2017   Procedure: LAPAROSCOPIC GASTRIC SLEEVE RESECTION  AND ERAS PATHWAY;  Surgeon: Excell Seltzer, MD;  Location: [facility];  Service: General;  Laterality: N/A;   LUMBAR FUSION     NASAL SEPTOPLASTY W/ TURBINOPLASTY Bilateral 03/22/2017   Procedure: NASAL SEPTOPLASTY WITH  BILATERAL INFERIOR   TURBINATE REDUCTION;  Surgeon: Jerrell Belfast, MD;  Location: [facility];  Service: ENT;  Laterality: Bilateral;   SHOULDER ARTHROSCOPY Left    x 2   SHOULDER ARTHROSCOPY WITH DISTAL CLAVICLE RESECTION Right 09/16/2020   Procedure: RIGHT SHOULDER ARTHROSCOPY WITH ARTHROSCOPIC DISTAL CLAVICLE EXCISION;  Surgeon: Meredith Pel, MD;  Location: [facility];  Service: Orthopedics;  Laterality: Right;   TONSILLECTOMY AND ADENOIDECTOMY     TYMPANOSTOMY TUBE  PLACEMENT Bilateral    WISDOM TOOTH EXTRACTION      Prior to Admission medications   Medication Sig Start Date End Date Taking? Authorizing Provider  amLODipine (NORVASC) 5 MG tablet Take 1 tablet by mouth daily. 08/07/21 08/07/22 Yes Gaylan Gerold, DO  cholecalciferol (VITAMIN D3) 25 MCG (1000 UNIT) tablet Take 1 tablet (1,000 Units total) by mouth daily. 08/14/21  Yes Axel Filler, MD  dapagliflozin propanediol (FARXIGA) 10 MG TABS tablet Take 1 tablet (10 mg total) by mouth daily before breakfast. 08/07/21  Yes Gaylan Gerold, DO  DULoxetine (CYMBALTA) 60 MG capsule TAKE 1 CAPSULE BY MOUTH ONCE A DAY 04/25/21 04/25/22 Yes Axel Filler, MD  HYDROcodone-acetaminophen [facility]) 10-325 MG tablet Take 1 tablet by mouth up to 4 (four) times daily. 10/27/21  Yes   losartan (COZAAR) 100 MG tablet TAKE 1 TABLET BY MOUTH DAILY. 07/18/21 07/18/22 Yes Axel Filler, MD  metFORMIN (GLUCOPHAGE) 1000 MG tablet Take 1 tablet by mouth twice daily with a meal. 10/17/21 10/17/22 Yes Axel Filler, MD  simvastatin (ZOCOR) 20 MG tablet Take 1 tablet (20 mg total) by mouth daily. 12/07/21  Yes Charise Killian, MD  tadalafil (CIALIS) 20 MG tablet TAKE 1 TABLET BY MOUTH DAILY AS NEEDED FOR ERECTILE DYSFUNCTION. 11/30/21 11/30/22 Yes Axel Filler, MD  tiZANidine (ZANAFLEX) 4 MG tablet Take 1 tablet (4 mg total) by mouth 3 (three) times daily as needed.  04/07/21  Yes   hydrOXYzine (ATARAX) 10 MG tablet Take 1 tablet (10 mg total) by mouth 3 (three) times daily as needed. Patient not taking: Reported on 12/18/2021 09/25/21   Axel Filler, MD  oxyCODONE-acetaminophen (PERCOCET) 10-325 MG tablet Take 1 tablet by mouth up to once daily as needed for 10 days Patient not taking: Reported on 12/18/2021 10/13/21     traZODone (DESYREL) 50 MG tablet Take 1 tablet (50 mg total) by mouth at bedtime. Patient not taking: Reported on 12/18/2021 12/11/21 01/12/22  Axel Filler, MD    Current  Outpatient Medications  Medication Sig Dispense Refill   amLODipine (NORVASC) 5 MG tablet Take 1 tablet by mouth daily. 90 tablet 2   cholecalciferol (VITAMIN D3) 25 MCG (1000 UNIT) tablet Take 1 tablet (1,000 Units total) by mouth daily. 90 tablet 1   dapagliflozin propanediol (FARXIGA) 10 MG TABS tablet Take 1 tablet (10 mg total) by mouth daily before breakfast. 90 tablet 3   DULoxetine (CYMBALTA) 60 MG capsule TAKE 1 CAPSULE BY MOUTH ONCE A DAY 90 capsule 3   HYDROcodone-acetaminophen (NORCO) 10-325 MG tablet Take 1 tablet by mouth up to 4 (four) times daily. 120 tablet 0   losartan (COZAAR) 100 MG tablet TAKE 1 TABLET BY MOUTH DAILY. 90 tablet 3   metFORMIN (GLUCOPHAGE) 1000 MG tablet Take 1 tablet by mouth twice daily with a meal. 180 tablet 3   simvastatin (ZOCOR) 20 MG tablet Take 1 tablet (20 mg total) by mouth daily. 90 tablet 3   tadalafil (CIALIS) 20 MG tablet TAKE 1 TABLET BY MOUTH DAILY AS NEEDED FOR ERECTILE DYSFUNCTION. 30 tablet 2   tiZANidine (ZANAFLEX) 4 MG tablet Take 1 tablet (4 mg total) by mouth 3 (three) times daily as needed. 90 tablet 5   hydrOXYzine (ATARAX) 10 MG tablet Take 1 tablet (10 mg total) by mouth 3 (three) times daily as needed. (Patient not taking: Reported on 12/18/2021) 30 tablet 2   oxyCODONE-acetaminophen (PERCOCET) 10-325 MG tablet Take 1 tablet by mouth up to once daily as needed for 10 days (Patient not taking: Reported on 12/18/2021) 10 tablet 0   traZODone (DESYREL) 50 MG tablet Take 1 tablet (50 mg total) by mouth at bedtime. (Patient not taking: Reported on 12/18/2021) 30 tablet 2   Current Facility-Administered Medications  Medication Dose Route Frequency Provider Last Rate Last Admin   0.9 %  sodium chloride infusion  500 mL Intravenous Once Terryon Pineiro, Carlota Raspberry, MD        Allergies as of 01/01/2022 - Review Complete 01/01/2022  Allergen Reaction Noted   Chlorhexidine Itching and Rash 09/16/2020    Family History  Problem Relation Age of  Onset   Hyperlipidemia Mother    Aneurysm Mother    Hypertension Mother    Diabetes Father    Hyperlipidemia Father    Hypertension Father    Healthy Daughter    Healthy Daughter    Colon cancer Neg Hx    Colon polyps Neg Hx    Esophageal cancer Neg Hx    Stomach cancer Neg Hx    Rectal cancer Neg Hx     Social History   Socioeconomic History   Marital status: Married    Spouse name: Not on file   Number of children: Not on file   Years of education: Not on file   Highest education level: Not on file  Occupational History   Not on file  Tobacco Use   Smoking status:  Never   Smokeless tobacco: Current    Types: Chew  Vaping Use   Vaping Use: Never used  Substance and Sexual Activity   Alcohol use: Not Currently   Drug use: No   Sexual activity: Yes  Other Topics Concern   Not on file  Social History Narrative   Not on file   Social Determinants of Health   Financial Resource Strain: Not on file  Food Insecurity: Not on file  Transportation Needs: Not on file  Physical Activity: Not on file  Stress: Not on file  Social Connections: Not on file  Intimate Partner Violence: Not on file    Review of Systems: All other review of systems negative except as mentioned in the HPI.  Physical Exam: Vital signs BP (!) 143/81   Pulse 68   Temp (!) 97.5 F (36.4 C)   Resp 12   Ht 6' (1.829 m)   Wt 226 lb (102.5 kg)   SpO2 94%   BMI 30.65 kg/m   General:   Alert,  Well-developed, pleasant and cooperative in NAD Lungs:  Clear throughout to auscultation.   Heart:  Regular rate and rhythm Abdomen:  Soft, nontender and nondistended.   Neuro/Psych:  Alert and cooperative. Normal mood and affect. A and O x 3  Jolly Mango, MD [facility]

## 2022-01-01 NOTE — Progress Notes (Signed)
Report given to PACU, vss 

## 2022-01-01 NOTE — Op Note (Signed)
[place] Patient Name: Adam [name] Procedure Date: 01/01/2022 7:49 AM MRN: [id number] Endoscopist: Remo Lipps P. Havery Moros , MD Age: 51 Referring MD:  Date of Birth: 01-22-1971 Gender: Male Account #: [id number] Procedure:                Colonoscopy Indications:              This is the patient's first colonoscopy, Positive                            Cologuard test Medicines:                Monitored Anesthesia Care Procedure:                Pre-Anesthesia Assessment:                           - Prior to the procedure, a History and Physical                            was performed, and patient medications and                            allergies were reviewed. The patient's tolerance of                            previous anesthesia was also reviewed. The risks                            and benefits of the procedure and the sedation                            options and risks were discussed with the patient.                            All questions were answered, and informed consent                            was obtained. Prior Anticoagulants: The patient has                            taken no previous anticoagulant or antiplatelet                            agents. ASA Grade Assessment: II - A patient with                            mild systemic disease. After reviewing the risks                            and benefits, the patient was deemed in                            satisfactory condition to undergo the procedure.  After obtaining informed consent, the colonoscope                            was passed under direct vision. Throughout the                            procedure, the patient's blood pressure, pulse, and                            oxygen saturations were monitored continuously. The                            CF [id number] #[id number] was introduced through the anus                            and advanced to the the cecum,  identified by                            appendiceal orifice and ileocecal valve. The                            colonoscopy was performed without difficulty. The                            patient tolerated the procedure well. The quality                            of the bowel preparation was good. The ileocecal                            valve, appendiceal orifice, and rectum were                            photographed. Scope In: 8:02:58 AM Scope Out: 8:23:46 AM Scope Withdrawal Time: 0 hours 13 minutes 41 seconds  Total Procedure Duration: 0 hours 20 minutes 48 seconds  Findings:                 The perianal and digital rectal examinations were                            normal.                           A 6 mm polyp was found in the hepatic flexure. The                            polyp was sessile. The polyp was removed with a                            cold snare. Resection and retrieval were complete.                           Three sessile polyps were found in the transverse  colon. The polyps were 3 to 5 mm in size. These                            polyps were removed with a cold snare. Resection                            and retrieval were complete.                           A 3 mm polyp was found in the descending colon. The                            polyp was sessile. The polyp was removed with a                            cold snare. Resection and retrieval were complete.                           A 4 mm polyp was found in the sigmoid colon. The                            polyp was sessile. The polyp was removed with a                            cold snare. Resection and retrieval were complete.                           Two sessile polyps were found in the rectum. The                            polyps were 3 to 8 mm in size. These polyps were                            removed with a cold snare. Resection and retrieval                             were complete. Both removed during cecal                            intubation. Site of largest polypectomy looked good                            upon withdrawal, no oozing / bleeding.                           A few small-mouthed diverticula were found in the                            sigmoid colon.                           Internal hemorrhoids were found during  retroflexion. The hemorrhoids were small.                           The exam was otherwise without abnormality. Complications:            No immediate complications. Estimated blood loss:                            Minimal. Estimated Blood Loss:     Estimated blood loss was minimal. Impression:               - One 6 mm polyp at the hepatic flexure, removed                            with a cold snare. Resected and retrieved.                           - Three 3 to 5 mm polyps in the transverse colon,                            removed with a cold snare. Resected and retrieved.                           - One 3 mm polyp in the descending colon, removed                            with a cold snare. Resected and retrieved.                           - One 4 mm polyp in the sigmoid colon, removed with                            a cold snare. Resected and retrieved.                           - Two 3 to 8 mm polyps in the rectum, removed with                            a cold snare. Resected and retrieved.                           - Diverticulosis in the sigmoid colon.                           - Internal hemorrhoids.                           - The examination was otherwise normal. Recommendation:           - Patient has a contact number available for                            emergencies. The signs and symptoms of potential  delayed complications were discussed with the                            patient. Return to normal activities tomorrow.                            Written  discharge instructions were provided to the                            patient.                           - Resume previous diet.                           - Continue present medications.                           - Await pathology results. Remo Lipps P. Macey Wurtz, MD 01/01/2022 8:31:09 AM This report has been signed electronically.

## 2022-01-02 ENCOUNTER — Telehealth: Payer: Self-pay

## 2022-01-02 NOTE — Telephone Encounter (Signed)
Attempted f/u call. No answer, left VM. 

## 2022-01-03 ENCOUNTER — Other Ambulatory Visit (HOSPITAL_COMMUNITY): Payer: Self-pay

## 2022-01-05 ENCOUNTER — Other Ambulatory Visit (HOSPITAL_COMMUNITY): Payer: Self-pay

## 2022-01-09 ENCOUNTER — Other Ambulatory Visit (HOSPITAL_COMMUNITY): Payer: Self-pay

## 2022-01-10 ENCOUNTER — Other Ambulatory Visit (HOSPITAL_COMMUNITY): Payer: Self-pay

## 2022-01-24 ENCOUNTER — Other Ambulatory Visit: Payer: Self-pay | Admitting: Nurse Practitioner

## 2022-01-24 DIAGNOSIS — M961 Postlaminectomy syndrome, not elsewhere classified: Secondary | ICD-10-CM

## 2022-02-05 ENCOUNTER — Other Ambulatory Visit (HOSPITAL_COMMUNITY): Payer: Self-pay

## 2022-02-07 ENCOUNTER — Other Ambulatory Visit (HOSPITAL_COMMUNITY): Payer: Self-pay

## 2022-02-10 ENCOUNTER — Ambulatory Visit
Admission: RE | Admit: 2022-02-10 | Discharge: 2022-02-10 | Disposition: A | Discharge status: Home / Self Care | Payer: 59 | Source: Ambulatory Visit | Attending: Nurse Practitioner | Admitting: Nurse Practitioner

## 2022-02-10 DIAGNOSIS — M961 Postlaminectomy syndrome, not elsewhere classified: Secondary | ICD-10-CM

## 2022-03-06 ENCOUNTER — Other Ambulatory Visit: Payer: Self-pay | Admitting: Student in an Organized Health Care Education/Training Program

## 2022-03-06 ENCOUNTER — Other Ambulatory Visit (HOSPITAL_COMMUNITY): Payer: Self-pay

## 2022-03-06 DIAGNOSIS — G4700 Insomnia, unspecified: Secondary | ICD-10-CM

## 2022-03-06 MED ORDER — TRAZODONE HCL 50 MG PO TABS
50.0000 mg | ORAL_TABLET | Freq: Every day | ORAL | 2 refills | Status: DC
  Filled 2022-03-06: qty 30, 30d supply, fill #0
  Filled 2022-04-06: qty 30, 30d supply, fill #1
  Filled 2022-05-07 – 2022-05-10 (×2): qty 30, 30d supply, fill #2

## 2022-03-08 ENCOUNTER — Other Ambulatory Visit (HOSPITAL_COMMUNITY): Payer: Self-pay

## 2022-03-11 ENCOUNTER — Other Ambulatory Visit (HOSPITAL_COMMUNITY): Payer: Self-pay

## 2022-03-12 ENCOUNTER — Other Ambulatory Visit (HOSPITAL_COMMUNITY): Payer: Self-pay

## 2022-03-14 ENCOUNTER — Other Ambulatory Visit (HOSPITAL_COMMUNITY): Payer: Self-pay

## 2022-03-20 ENCOUNTER — Telehealth: Payer: Self-pay | Admitting: Rheumatology

## 2022-03-20 ENCOUNTER — Other Ambulatory Visit: Payer: Self-pay | Admitting: *Deleted

## 2022-03-20 DIAGNOSIS — M19011 Primary osteoarthritis, right shoulder: Secondary | ICD-10-CM

## 2022-03-20 DIAGNOSIS — M0579 Rheumatoid arthritis with rheumatoid factor of multiple sites without organ or systems involvement: Secondary | ICD-10-CM

## 2022-03-20 DIAGNOSIS — M7061 Trochanteric bursitis, right hip: Secondary | ICD-10-CM

## 2022-03-20 DIAGNOSIS — Z79899 Other long term (current) drug therapy: Secondary | ICD-10-CM

## 2022-03-20 NOTE — Telephone Encounter (Signed)
I called patient, referral faxed, pending appt.

## 2022-03-20 NOTE — Telephone Encounter (Signed)
Patient called to check the status of his referral to Dr. Allayne Butcher.  Patient states he relocated to [facility] and Dr. Estanislado Pandy was sending a referral.  Patient states he is currently experiencing a flair and hasn't received a call to schedule an appointment.  \

## 2022-04-10 ENCOUNTER — Other Ambulatory Visit (HOSPITAL_COMMUNITY): Payer: Self-pay

## 2022-04-19 ENCOUNTER — Other Ambulatory Visit: Payer: Self-pay | Admitting: Orthopedic Surgery

## 2022-04-19 DIAGNOSIS — M545 Low back pain, unspecified: Secondary | ICD-10-CM

## 2022-04-25 ENCOUNTER — Ambulatory Visit (INDEPENDENT_AMBULATORY_CARE_PROVIDER_SITE_OTHER): Payer: 59 | Admitting: Student

## 2022-04-25 ENCOUNTER — Telehealth: Payer: Self-pay | Admitting: Student in an Organized Health Care Education/Training Program

## 2022-04-25 DIAGNOSIS — F410 Panic disorder [episodic paroxysmal anxiety] without agoraphobia: Secondary | ICD-10-CM

## 2022-04-25 DIAGNOSIS — F411 Generalized anxiety disorder: Secondary | ICD-10-CM

## 2022-04-25 MED ORDER — HYDROXYZINE HCL 25 MG PO TABS: 25.0000 mg | ORAL_TABLET | Freq: Every day | ORAL | 0 refills | Status: DC | PRN

## 2022-04-25 MED ORDER — BUSPIRONE HCL 10 MG PO TABS: 10.0000 mg | ORAL_TABLET | Freq: Two times a day (BID) | ORAL | 0 refills | Status: DC

## 2022-04-25 NOTE — Progress Notes (Signed)
  [facility] Telephone Encounter Continuity Care Appointment  HPI:  This telephone encounter was created for Mr. Adam [name] on 04/25/2022 for the following purpose/cc anxiety.   Past Medical History:  Past Medical History:  Diagnosis Date   Anxiety    Arthritis    lower back, hands   Depression    Deviated septum    nasal turbinate hypertrophy   Hypertension    states under control with meds., has been on medication since age 52   Insulin dependent diabetes mellitus    type 2 DM   Morbid obesity ([place])    Sleep apnea    uses CPAP "most of the time", per pt.     ROS:  As per HPI   Assessment / Plan / Recommendations:  Please see A&P under problem oriented charting for assessment of the patient's acute and chronic medical conditions.  As always, pt is advised that if symptoms worsen or new symptoms arise, they should go to an urgent care facility or to to ER for further evaluation.   Consent and Medical Decision Making:  Patient discussed with Dr. Evette Doffing This is a telephone encounter between Dola Factor and Sanjuan Dame on 04/25/2022 for anxiety. The visit was conducted with the patient located at home and Sanjuan Dame at [facility]. The patient's identity was confirmed using their DOB and current address. The patient has consented to being evaluated through a telephone encounter and understands the associated risks (an examination cannot be done and the patient may need to come in for an appointment) / benefits (allows the patient to remain at home, decreasing exposure to coronavirus). I personally spent 8 minutes on medical discussion.    Anxiety disorder Adam [name] is presenting via telephone to discuss recent increase in his anxiety. Repots overall it has been difficult to function over the last few weeks given how much this is affecting him. He denies any precipitating event to these symptoms. He does report panic attacks that occur 1-2  times weekly. Notes that he typically has to focus on the task at hand and focus on his breathing to get through them. He has been compliant with duloxetine and is planning to start therapy soon. Denies any suicidal or homicidal ideations.  Discussed with Adam [name] adding on daily buspirone to his current regimen with PRN hydroxyzine for his panic attacks. Previously he was prescribed hydroxyzine '10mg'$  without much improvement, we will increase this to '25mg'$  as needed. Will plan to follow-up with telehealth visit in one month (currently working in [place]).  - Continue duloxetine '60mg'$  daily - Start buspirone '10mg'$  twice daily, can up-titrate if needed - Start hydroxyzine '25mg'$  daily PRN for panic attacks  - Follow-up in one month  Sanjuan Dame, MD Internal Medicine PGY-3 Pager: [phone]

## 2022-04-25 NOTE — Telephone Encounter (Signed)
Returned call to patient. Given tele appt today at 3:15 with [facility].

## 2022-04-25 NOTE — Telephone Encounter (Signed)
Pt reporting he is having some panic attacks at a high level for the past 2 months, and is requesting a medication be called in or, to see Dr.Vincent to discuss.

## 2022-04-25 NOTE — Assessment & Plan Note (Signed)
Mr. Crisman is presenting via telephone to discuss recent increase in his anxiety. Repots overall it has been difficult to function over the last few weeks given how much this is affecting him. He denies any precipitating event to these symptoms. He does report panic attacks that occur 1-2 times weekly. Notes that he typically has to focus on the task at hand and focus on his breathing to get through them. He has been compliant with duloxetine and is planning to start therapy soon. Denies any suicidal or homicidal ideations.  Discussed with Mr. Minotti adding on daily buspirone to his current regimen with PRN hydroxyzine for his panic attacks. Previously he was prescribed hydroxyzine '10mg'$  without much improvement, we will increase this to '25mg'$  as needed. Will plan to follow-up with telehealth visit in one month (currently working in [place]).  - Continue duloxetine '60mg'$  daily - Start buspirone '10mg'$  twice daily, can up-titrate if needed - Start hydroxyzine '25mg'$  daily PRN for panic attacks  - Follow-up in one month

## 2022-04-26 NOTE — Progress Notes (Signed)
Internal Medicine Clinic Attending  Case discussed with Dr. Collene Gobble  At the time of the visit.  We reviewed the resident's history and pertinent patient test results.  I agree with the assessment, diagnosis, and plan of care documented in the resident's note.

## 2022-04-30 ENCOUNTER — Other Ambulatory Visit (HOSPITAL_COMMUNITY): Payer: Self-pay

## 2022-04-30 MED ORDER — HYDROCODONE-ACETAMINOPHEN 10-325 MG PO TABS
ORAL_TABLET | ORAL | 0 refills | Status: DC
  Filled 2022-04-30: qty 120, 30d supply, fill #0

## 2022-05-07 ENCOUNTER — Other Ambulatory Visit: Payer: Self-pay | Admitting: Student in an Organized Health Care Education/Training Program

## 2022-05-07 ENCOUNTER — Other Ambulatory Visit: Payer: Self-pay

## 2022-05-08 ENCOUNTER — Other Ambulatory Visit: Payer: Self-pay

## 2022-05-08 ENCOUNTER — Other Ambulatory Visit (HOSPITAL_COMMUNITY): Payer: Self-pay

## 2022-05-09 MED ORDER — DULOXETINE HCL 60 MG PO CPEP
60.0000 mg | ORAL_CAPSULE | Freq: Every day | ORAL | 3 refills | Status: AC
  Filled 2022-05-14: qty 30, 30d supply, fill #0
  Filled 2022-06-09: qty 30, 30d supply, fill #1
  Filled 2022-07-09: qty 30, 30d supply, fill #2
  Filled 2022-08-08: qty 30, 30d supply, fill #3
  Filled 2022-09-06: qty 30, 30d supply, fill #4
  Filled 2022-10-03: qty 30, 30d supply, fill #5
  Filled 2022-11-04: qty 30, 30d supply, fill #6

## 2022-05-09 NOTE — Telephone Encounter (Signed)
LOV was 04/25/22 with Dr Collene Gobble.

## 2022-05-10 ENCOUNTER — Other Ambulatory Visit (HOSPITAL_COMMUNITY): Payer: Self-pay

## 2022-05-10 ENCOUNTER — Other Ambulatory Visit: Payer: Self-pay

## 2022-05-14 ENCOUNTER — Other Ambulatory Visit (HOSPITAL_COMMUNITY): Payer: Self-pay

## 2022-05-18 ENCOUNTER — Other Ambulatory Visit: Payer: Self-pay | Admitting: Student

## 2022-05-18 DIAGNOSIS — F411 Generalized anxiety disorder: Secondary | ICD-10-CM

## 2022-05-18 DIAGNOSIS — F410 Panic disorder [episodic paroxysmal anxiety] without agoraphobia: Secondary | ICD-10-CM

## 2022-05-23 ENCOUNTER — Ambulatory Visit
Admission: RE | Admit: 2022-05-23 | Discharge: 2022-05-23 | Disposition: A | Discharge status: Home / Self Care | Payer: 59 | Source: Ambulatory Visit | Attending: Orthopedic Surgery | Admitting: Orthopedic Surgery

## 2022-05-23 ENCOUNTER — Other Ambulatory Visit (HOSPITAL_COMMUNITY): Payer: Self-pay

## 2022-05-23 DIAGNOSIS — M545 Low back pain, unspecified: Secondary | ICD-10-CM

## 2022-05-29 ENCOUNTER — Other Ambulatory Visit (HOSPITAL_COMMUNITY): Payer: Self-pay

## 2022-05-30 ENCOUNTER — Other Ambulatory Visit: Payer: Self-pay | Admitting: Orthopedic Surgery

## 2022-05-30 ENCOUNTER — Other Ambulatory Visit (HOSPITAL_COMMUNITY): Payer: Self-pay

## 2022-05-30 DIAGNOSIS — M545 Low back pain, unspecified: Secondary | ICD-10-CM

## 2022-05-30 MED ORDER — HYDROCODONE-ACETAMINOPHEN 10-325 MG PO TABS
1.0000 | ORAL_TABLET | Freq: Four times a day (QID) | ORAL | 0 refills | Status: DC
  Filled 2022-05-30 (×2): qty 120, 30d supply, fill #0

## 2022-06-05 ENCOUNTER — Other Ambulatory Visit (HOSPITAL_COMMUNITY): Payer: Self-pay

## 2022-06-05 ENCOUNTER — Other Ambulatory Visit: Payer: Self-pay | Admitting: Student in an Organized Health Care Education/Training Program

## 2022-06-05 ENCOUNTER — Other Ambulatory Visit: Payer: Self-pay

## 2022-06-05 DIAGNOSIS — G4700 Insomnia, unspecified: Secondary | ICD-10-CM

## 2022-06-05 MED ORDER — TRAZODONE HCL 50 MG PO TABS
50.0000 mg | ORAL_TABLET | Freq: Every day | ORAL | 2 refills | Status: DC
  Filled 2022-06-05: qty 30, 30d supply, fill #0
  Filled 2022-07-02: qty 30, 30d supply, fill #1
  Filled 2022-08-01: qty 30, 30d supply, fill #2

## 2022-06-11 ENCOUNTER — Other Ambulatory Visit (HOSPITAL_COMMUNITY): Payer: Self-pay

## 2022-06-11 ENCOUNTER — Other Ambulatory Visit: Payer: Self-pay

## 2022-06-28 ENCOUNTER — Other Ambulatory Visit (HOSPITAL_COMMUNITY): Payer: Self-pay

## 2022-06-28 MED ORDER — HYDROCODONE-ACETAMINOPHEN 7.5-325 MG PO TABS
1.0000 | ORAL_TABLET | Freq: Four times a day (QID) | ORAL | 0 refills | Status: AC | PRN
  Filled 2022-06-28: qty 40, 10d supply, fill #0
  Filled 2022-06-28: qty 30, 8d supply, fill #0
  Filled 2022-06-28: qty 120, 30d supply, fill #0
  Filled 2022-06-28: qty 90, 22d supply, fill #0

## 2022-06-28 MED ORDER — HYDROCODONE-ACETAMINOPHEN 10-325 MG PO TABS
1.0000 | ORAL_TABLET | Freq: Four times a day (QID) | ORAL | 0 refills | Status: DC
  Filled 2022-06-28: qty 120, 30d supply, fill #0

## 2022-06-29 ENCOUNTER — Other Ambulatory Visit (HOSPITAL_COMMUNITY): Payer: Self-pay

## 2022-07-02 ENCOUNTER — Other Ambulatory Visit (HOSPITAL_COMMUNITY): Payer: Self-pay

## 2022-07-04 ENCOUNTER — Other Ambulatory Visit: Payer: Self-pay | Admitting: Student in an Organized Health Care Education/Training Program

## 2022-07-04 DIAGNOSIS — N529 Male erectile dysfunction, unspecified: Secondary | ICD-10-CM

## 2022-07-05 ENCOUNTER — Other Ambulatory Visit (HOSPITAL_COMMUNITY): Payer: Self-pay

## 2022-07-05 MED ORDER — TADALAFIL 20 MG PO TABS
20.0000 mg | ORAL_TABLET | Freq: Every day | ORAL | 2 refills | Status: AC | PRN
  Filled 2022-08-08: qty 30, 30d supply, fill #0

## 2022-07-09 ENCOUNTER — Other Ambulatory Visit (HOSPITAL_COMMUNITY): Payer: Self-pay

## 2022-07-13 ENCOUNTER — Other Ambulatory Visit (HOSPITAL_COMMUNITY): Payer: Self-pay

## 2022-07-13 MED ORDER — HYDROCODONE-ACETAMINOPHEN 7.5-325 MG PO TABS
1.0000 | ORAL_TABLET | Freq: Four times a day (QID) | ORAL | 0 refills | Status: AC | PRN
  Filled 2022-07-30: qty 36, 9d supply, fill #0

## 2022-07-17 LAB — LAB REPORT - SCANNED: EGFR: 103

## 2022-07-19 ENCOUNTER — Ambulatory Visit
Admission: RE | Admit: 2022-07-19 | Discharge: 2022-07-19 | Disposition: A | Discharge status: Home / Self Care | Payer: 59 | Source: Ambulatory Visit | Attending: Orthopedic Surgery | Admitting: Orthopedic Surgery

## 2022-07-19 DIAGNOSIS — M545 Low back pain, unspecified: Secondary | ICD-10-CM

## 2022-07-19 MED ORDER — METHYLPREDNISOLONE ACETATE 40 MG/ML INJ SUSP (RADIOLOG: 120.0000 mg | Freq: Once | INTRAMUSCULAR | Status: DC

## 2022-07-30 ENCOUNTER — Other Ambulatory Visit (HOSPITAL_COMMUNITY): Payer: Self-pay

## 2022-07-30 ENCOUNTER — Other Ambulatory Visit: Payer: Self-pay

## 2022-08-03 ENCOUNTER — Other Ambulatory Visit: Payer: Self-pay | Admitting: Student in an Organized Health Care Education/Training Program

## 2022-08-03 MED ORDER — LOSARTAN POTASSIUM 100 MG PO TABS
100.0000 mg | ORAL_TABLET | Freq: Every day | ORAL | 3 refills | Status: AC
  Filled 2022-08-08: qty 30, 30d supply, fill #0
  Filled 2022-09-06: qty 30, 30d supply, fill #1
  Filled 2022-10-03: qty 30, 30d supply, fill #2
  Filled 2022-11-04: qty 30, 30d supply, fill #3

## 2022-08-07 ENCOUNTER — Other Ambulatory Visit (HOSPITAL_COMMUNITY): Payer: Self-pay

## 2022-08-07 MED ORDER — HYDROCODONE-ACETAMINOPHEN 10-325 MG PO TABS
1.0000 | ORAL_TABLET | Freq: Four times a day (QID) | ORAL | 0 refills | Status: DC
  Filled 2022-08-07: qty 120, 30d supply, fill #0

## 2022-08-08 ENCOUNTER — Other Ambulatory Visit (HOSPITAL_COMMUNITY): Payer: Self-pay

## 2022-08-09 ENCOUNTER — Other Ambulatory Visit (HOSPITAL_COMMUNITY): Payer: Self-pay

## 2022-08-09 ENCOUNTER — Other Ambulatory Visit: Payer: Self-pay

## 2022-08-10 ENCOUNTER — Other Ambulatory Visit (HOSPITAL_COMMUNITY): Payer: Self-pay

## 2022-08-10 ENCOUNTER — Encounter (HOSPITAL_COMMUNITY): Payer: Self-pay

## 2022-08-10 ENCOUNTER — Other Ambulatory Visit: Payer: Self-pay

## 2022-08-24 ENCOUNTER — Other Ambulatory Visit (HOSPITAL_COMMUNITY): Payer: Self-pay

## 2022-08-27 ENCOUNTER — Other Ambulatory Visit (HOSPITAL_COMMUNITY): Payer: Self-pay

## 2022-08-28 ENCOUNTER — Other Ambulatory Visit: Payer: Self-pay

## 2022-08-28 ENCOUNTER — Other Ambulatory Visit: Payer: Self-pay | Admitting: Student in an Organized Health Care Education/Training Program

## 2022-08-28 DIAGNOSIS — G4700 Insomnia, unspecified: Secondary | ICD-10-CM

## 2022-08-28 MED ORDER — TRAZODONE HCL 50 MG PO TABS
50.00 mg | ORAL_TABLET | Freq: Every day | ORAL | 2 refills | Status: AC
Start: 2022-08-28 — End: ?
  Filled 2022-08-28: qty 30, 30d supply, fill #0
  Filled 2022-09-24 – 2022-09-29 (×2): qty 30, 30d supply, fill #1
  Filled 2022-10-26: qty 30, 30d supply, fill #2

## 2022-09-04 ENCOUNTER — Other Ambulatory Visit (HOSPITAL_COMMUNITY): Payer: Self-pay

## 2022-09-04 MED ORDER — HYDROCODONE-ACETAMINOPHEN 10-325 MG PO TABS
1.0000 | ORAL_TABLET | Freq: Four times a day (QID) | ORAL | 0 refills | Status: DC
  Filled 2022-09-04 – 2022-09-06 (×4): qty 24, 6d supply, fill #0

## 2022-09-06 ENCOUNTER — Other Ambulatory Visit (HOSPITAL_COMMUNITY): Payer: Self-pay

## 2022-09-06 ENCOUNTER — Other Ambulatory Visit: Payer: Self-pay

## 2022-09-07 ENCOUNTER — Other Ambulatory Visit (HOSPITAL_COMMUNITY): Payer: Self-pay

## 2022-09-07 ENCOUNTER — Encounter (HOSPITAL_COMMUNITY): Payer: Self-pay

## 2022-09-10 ENCOUNTER — Other Ambulatory Visit (HOSPITAL_COMMUNITY): Payer: Self-pay

## 2022-09-11 ENCOUNTER — Other Ambulatory Visit (HOSPITAL_COMMUNITY): Payer: Self-pay

## 2022-09-11 MED ORDER — HYDROCODONE-ACETAMINOPHEN 10-325 MG PO TABS
1.0000 | ORAL_TABLET | Freq: Four times a day (QID) | ORAL | 0 refills | Status: DC
  Filled 2022-09-11: qty 120, 30d supply, fill #0

## 2022-09-18 ENCOUNTER — Other Ambulatory Visit (HOSPITAL_COMMUNITY): Payer: Self-pay

## 2022-09-20 ENCOUNTER — Other Ambulatory Visit (HOSPITAL_COMMUNITY): Payer: Self-pay

## 2022-09-25 ENCOUNTER — Other Ambulatory Visit: Payer: Self-pay

## 2022-09-25 ENCOUNTER — Encounter: Payer: Self-pay | Admitting: Pharmacist

## 2022-09-28 ENCOUNTER — Other Ambulatory Visit: Payer: Self-pay

## 2022-10-01 ENCOUNTER — Other Ambulatory Visit (HOSPITAL_COMMUNITY): Payer: Self-pay

## 2022-10-01 ENCOUNTER — Other Ambulatory Visit: Payer: Self-pay | Admitting: Student in an Organized Health Care Education/Training Program

## 2022-10-01 DIAGNOSIS — E1169 Type 2 diabetes mellitus with other specified complication: Secondary | ICD-10-CM

## 2022-10-01 DIAGNOSIS — Z794 Long term (current) use of insulin: Secondary | ICD-10-CM

## 2022-10-02 ENCOUNTER — Other Ambulatory Visit: Payer: Self-pay | Admitting: Orthopedic Surgery

## 2022-10-02 ENCOUNTER — Other Ambulatory Visit: Payer: Self-pay

## 2022-10-02 ENCOUNTER — Other Ambulatory Visit (HOSPITAL_COMMUNITY): Payer: Self-pay

## 2022-10-02 DIAGNOSIS — M545 Low back pain, unspecified: Secondary | ICD-10-CM

## 2022-10-02 MED ORDER — DAPAGLIFLOZIN PROPANEDIOL 10 MG PO TABS
10.0000 mg | ORAL_TABLET | Freq: Every day | ORAL | 3 refills | Status: AC
Start: 2022-10-02 — End: ?
  Filled 2022-10-02: qty 30, 30d supply, fill #0
  Filled 2022-10-26: qty 30, 30d supply, fill #1
  Filled 2022-11-26: qty 30, 30d supply, fill #2
  Filled 2022-12-25: qty 30, 30d supply, fill #3
  Filled 2023-01-18: qty 30, 30d supply, fill #4
  Filled 2023-02-18: qty 30, 30d supply, fill #5
  Filled 2023-03-18: qty 30, 30d supply, fill #6
  Filled 2023-04-12: qty 30, 30d supply, fill #7
  Filled 2023-05-18: qty 30, 30d supply, fill #8
  Filled 2023-06-17: qty 30, 30d supply, fill #9
  Filled 2023-07-17: qty 30, 30d supply, fill #10
  Filled 2023-08-12: qty 30, 30d supply, fill #11

## 2022-10-03 ENCOUNTER — Other Ambulatory Visit: Payer: Self-pay

## 2022-10-03 ENCOUNTER — Other Ambulatory Visit (HOSPITAL_COMMUNITY): Payer: Self-pay

## 2022-10-10 ENCOUNTER — Other Ambulatory Visit (HOSPITAL_COMMUNITY): Payer: Self-pay

## 2022-10-10 MED ORDER — HYDROCODONE-ACETAMINOPHEN 10-325 MG PO TABS
1.0000 | ORAL_TABLET | Freq: Four times a day (QID) | ORAL | 0 refills | Status: DC
  Filled 2022-10-10: qty 120, 30d supply, fill #0

## 2022-10-26 ENCOUNTER — Other Ambulatory Visit (HOSPITAL_COMMUNITY): Payer: Self-pay

## 2022-10-26 ENCOUNTER — Other Ambulatory Visit: Payer: Self-pay

## 2022-11-01 ENCOUNTER — Other Ambulatory Visit: Payer: Self-pay | Admitting: Student in an Organized Health Care Education/Training Program

## 2022-11-01 NOTE — Telephone Encounter (Signed)
Called pt to see if he's still a pt at [facility] or has he transferred his care. Per chart, pt lives in [place].

## 2022-11-05 ENCOUNTER — Other Ambulatory Visit: Payer: Self-pay

## 2022-11-06 IMAGING — DX DG CERVICAL SPINE COMPLETE 4+V
6 series · 6 of 6 positions shown · non-contrast
Comparison: None.

CLINICAL DATA: Chronic neck pain.

EXAM:
CERVICAL SPINE - COMPLETE 4+ VIEW

[c-spine lat]
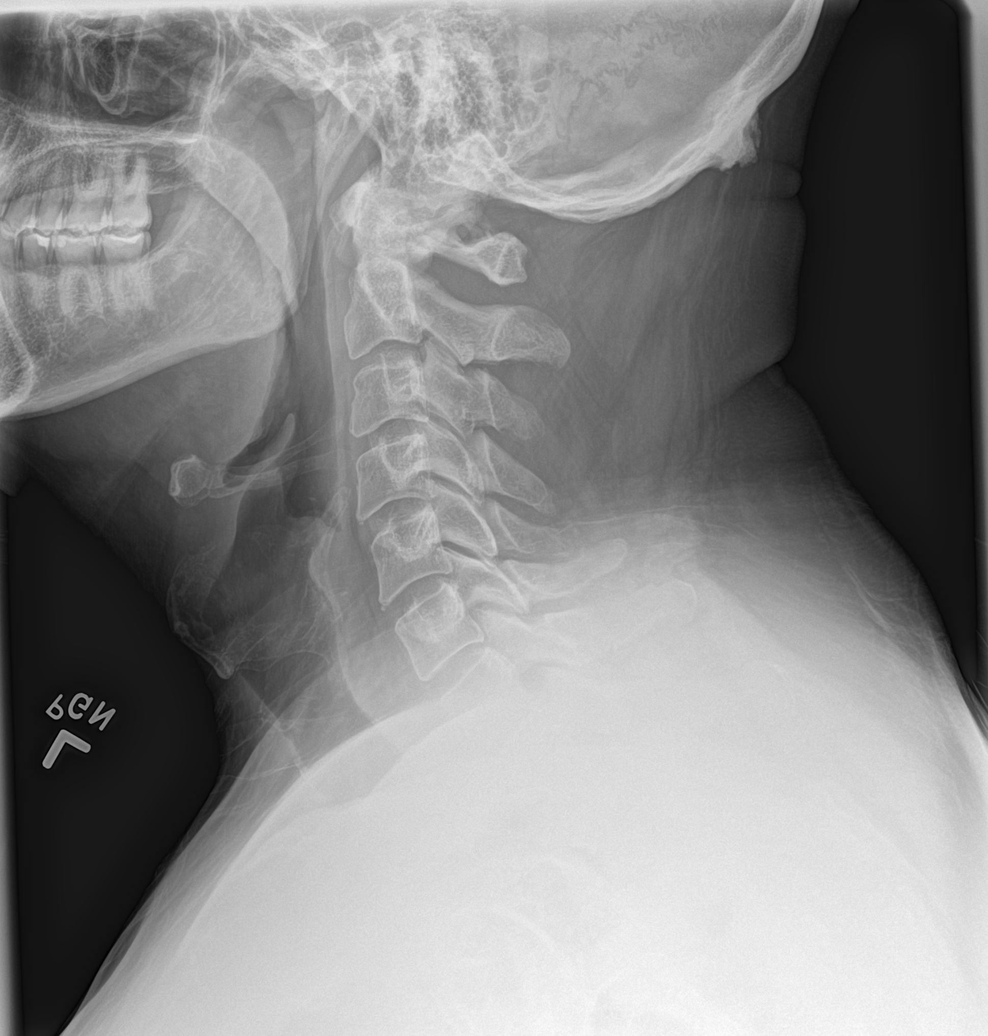

[c-spine obl (1 of 2)]
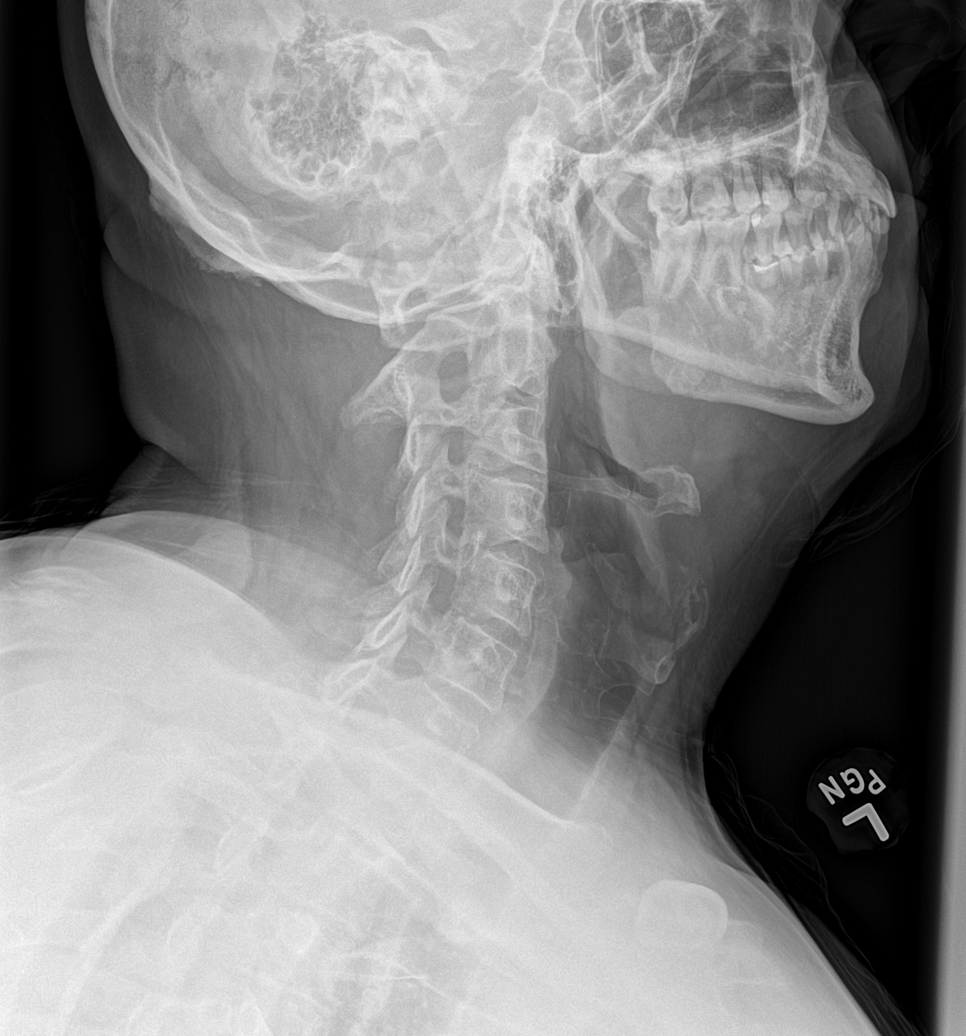

[c-spine obl (2 of 2)]
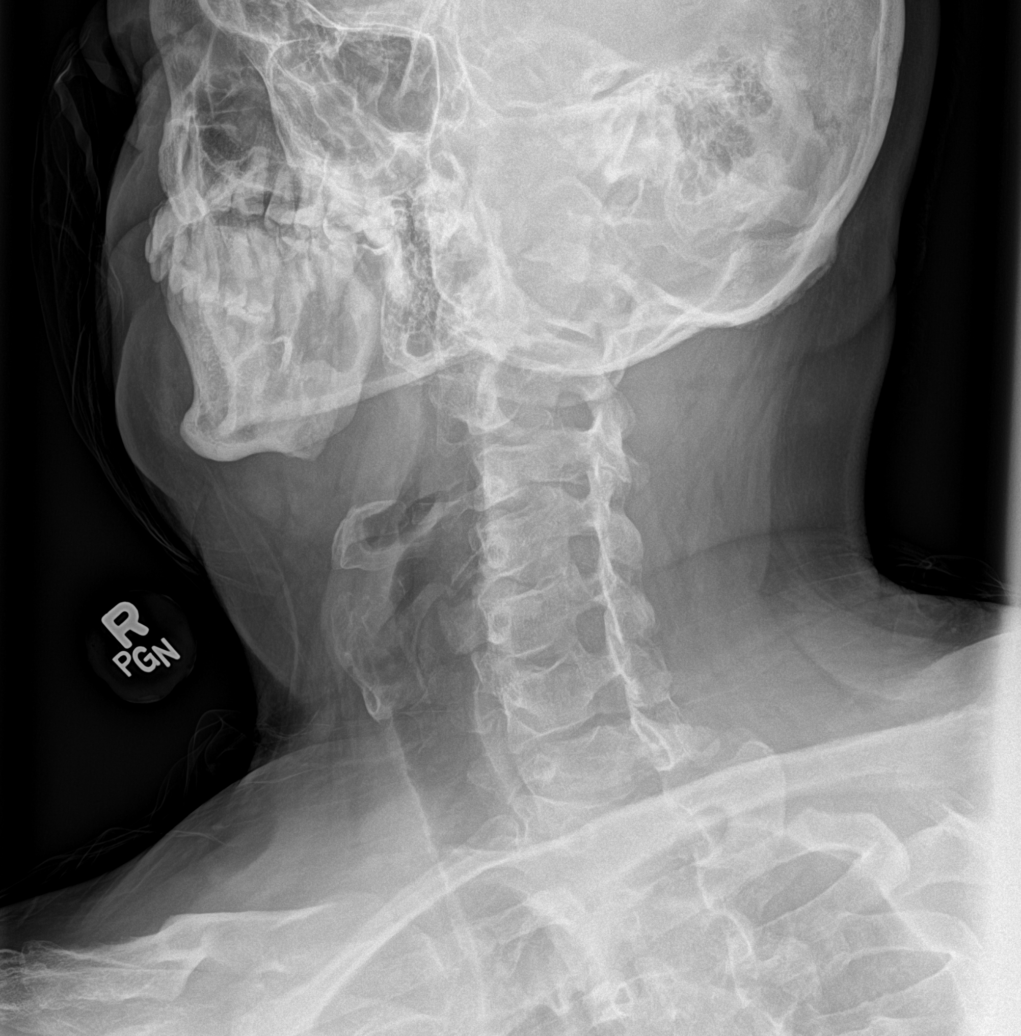

[c-spine ap]
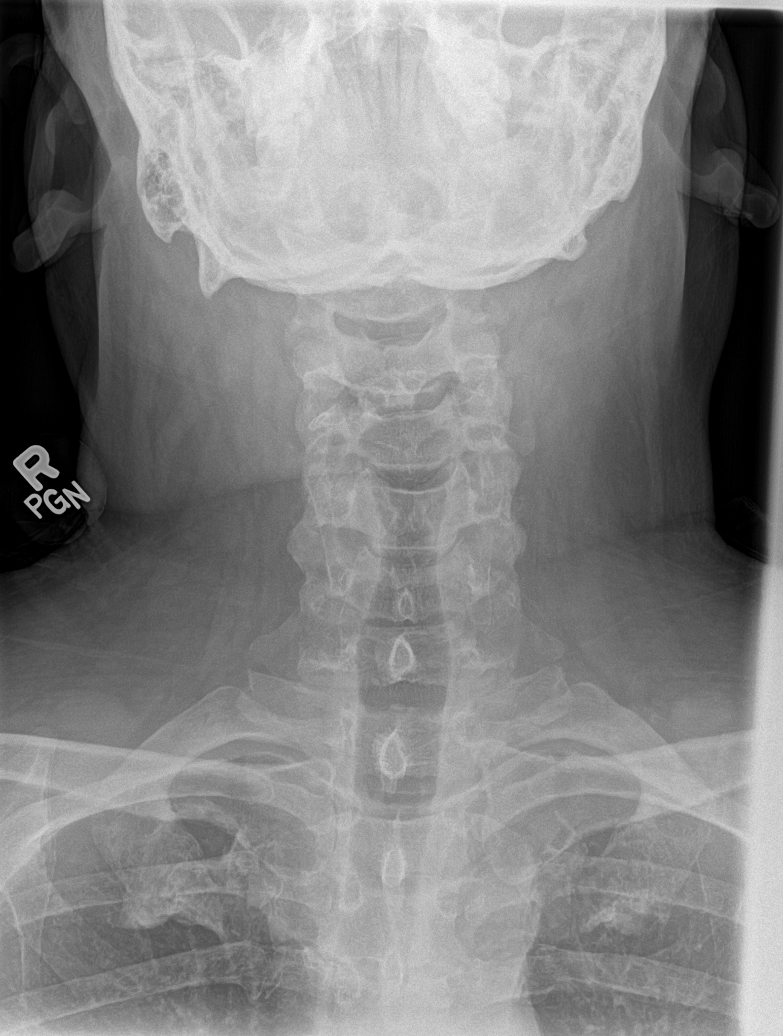

[c-spine open mouth]
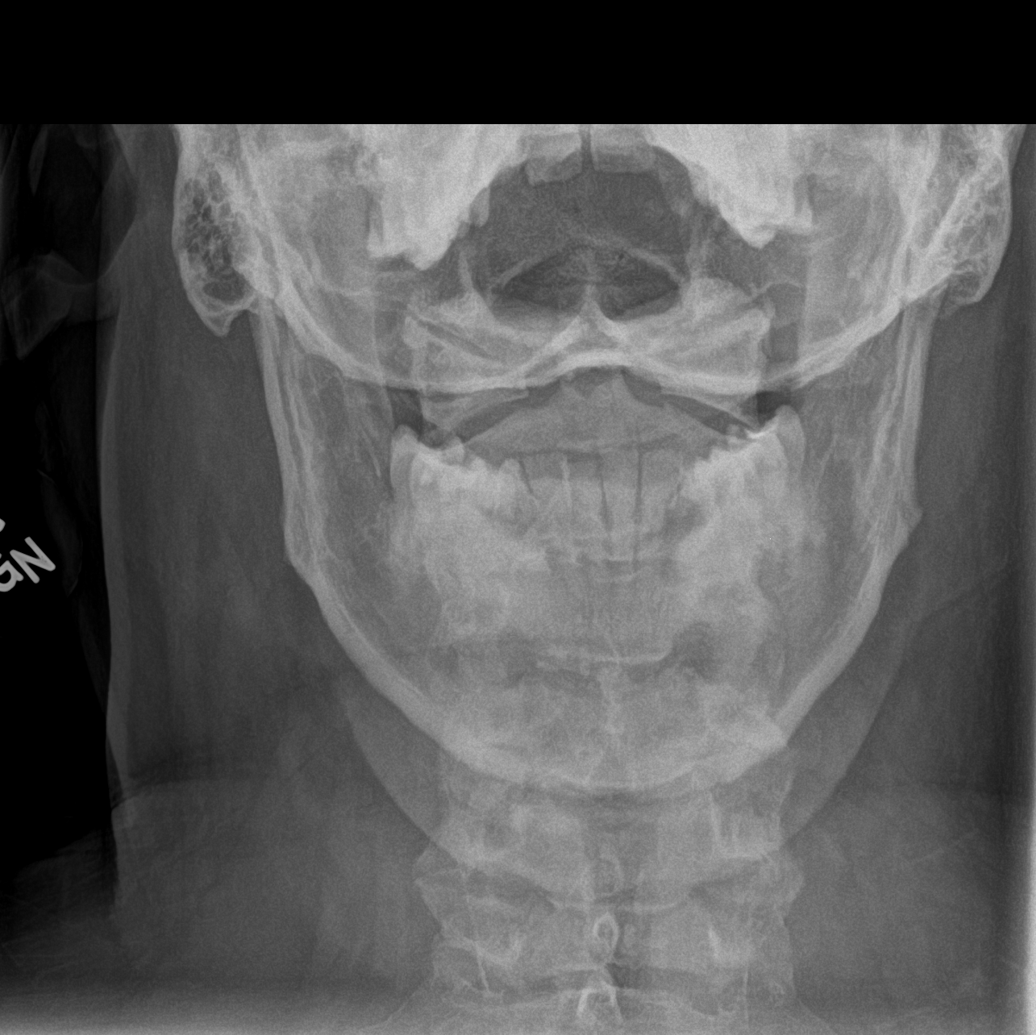

[ct-spine swimmers]
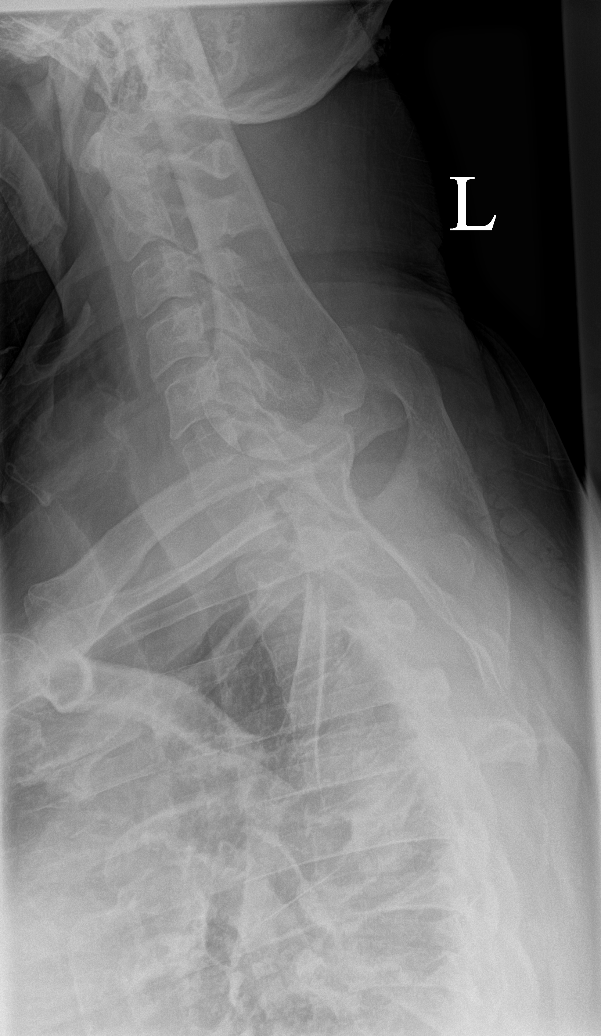

[6 of 6 positions shown; findings below may reference images not displayed]

FINDINGS: There is no evidence of cervical spine fracture or prevertebral soft
tissue swelling. Alignment is normal. No other significant bone
abnormalities are identified.
IMPRESSION: Negative cervical spine radiographs.

## 2022-11-06 IMAGING — DX DG LUMBAR SPINE COMPLETE 4+V
5 series · 5 of 5 positions shown · non-contrast
Comparison: None.

CLINICAL DATA: Chronic low back pain.

EXAM:
LUMBAR SPINE - COMPLETE 4+ VIEW

[l-spine ap]
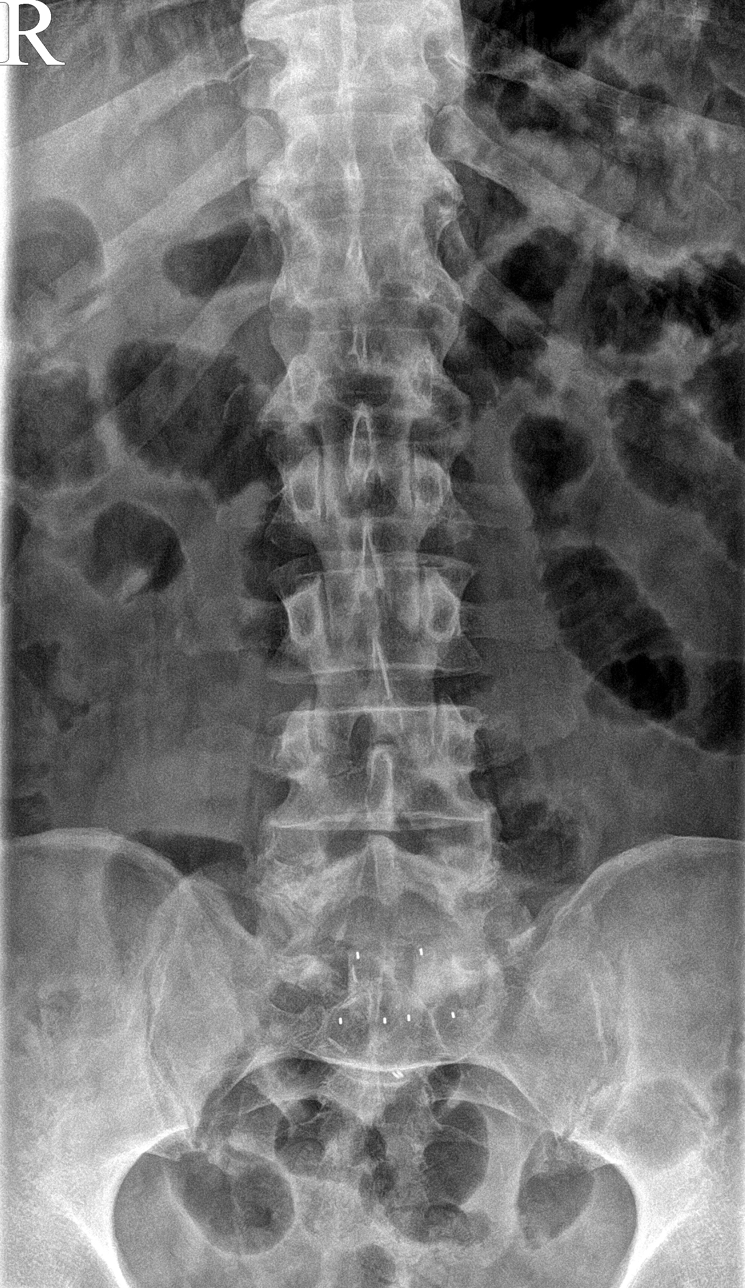

[l-spine obl (1 of 2)]
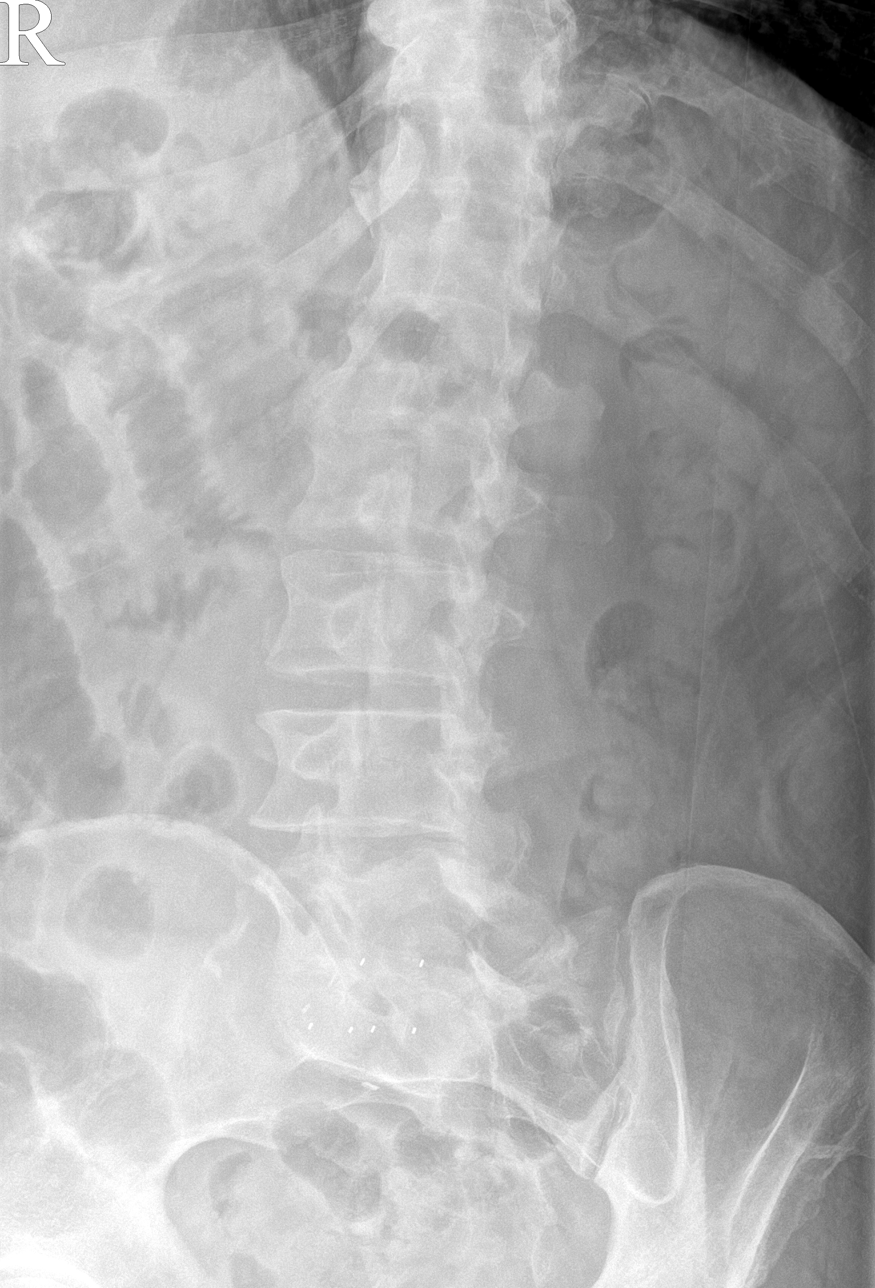

[l-spine obl (2 of 2)]
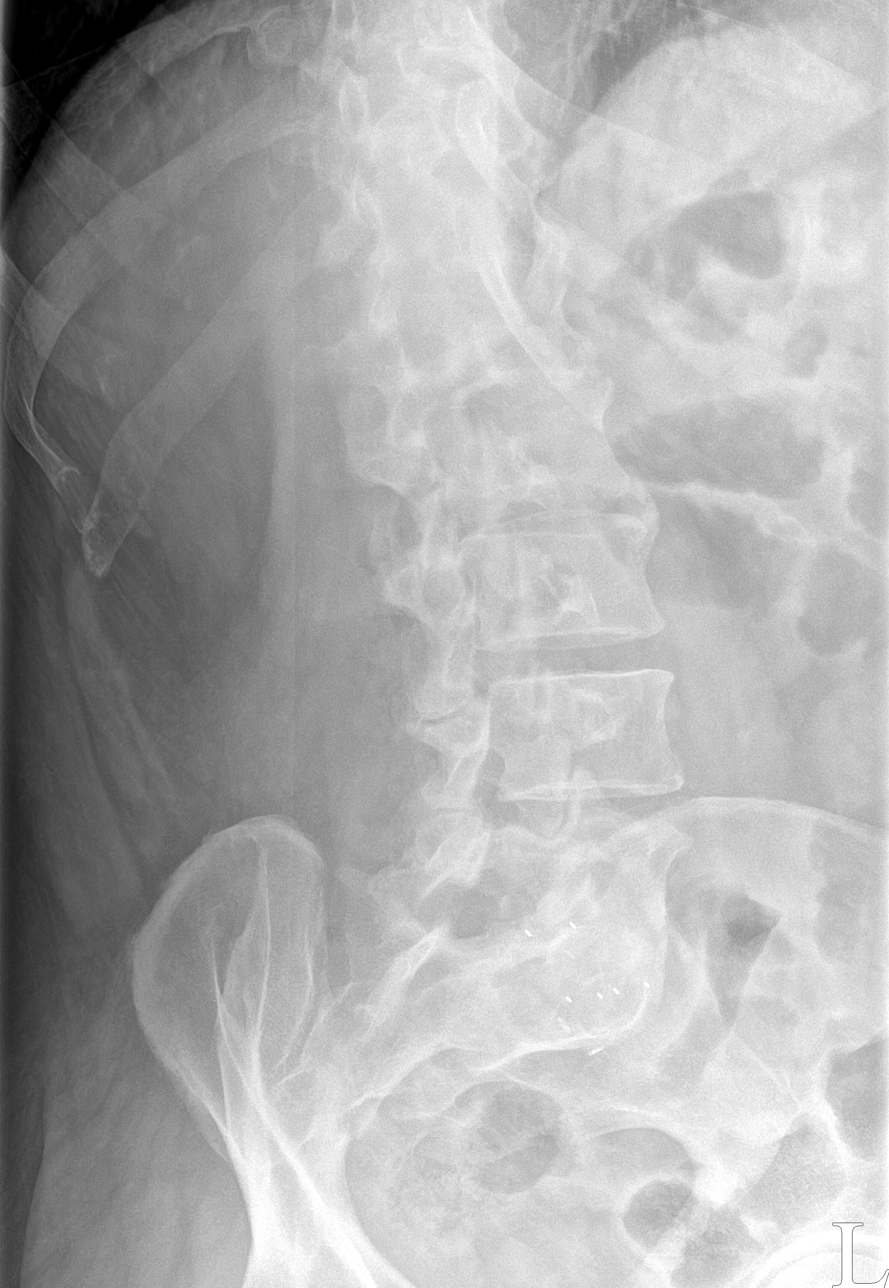

[l-spine lateral]
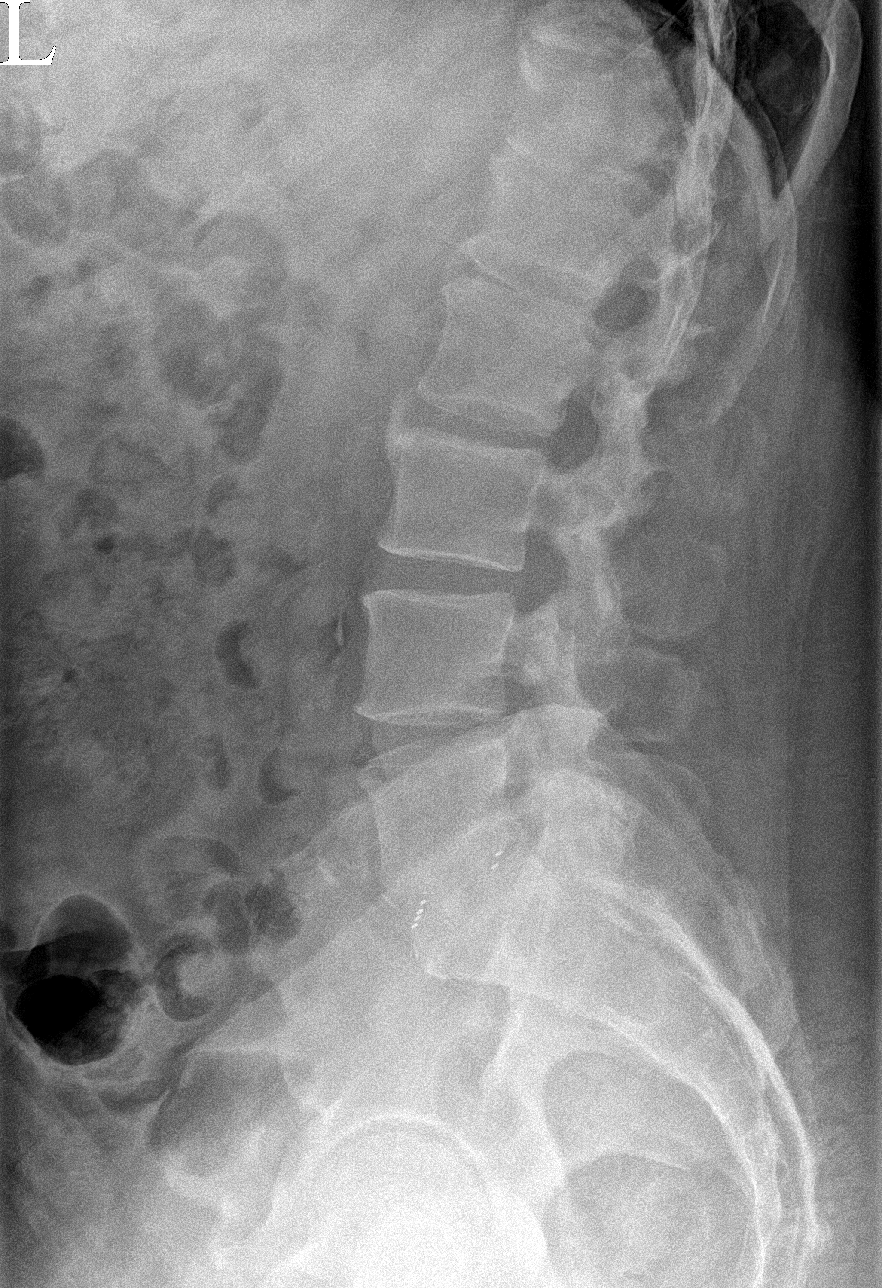

[l-spine spot]
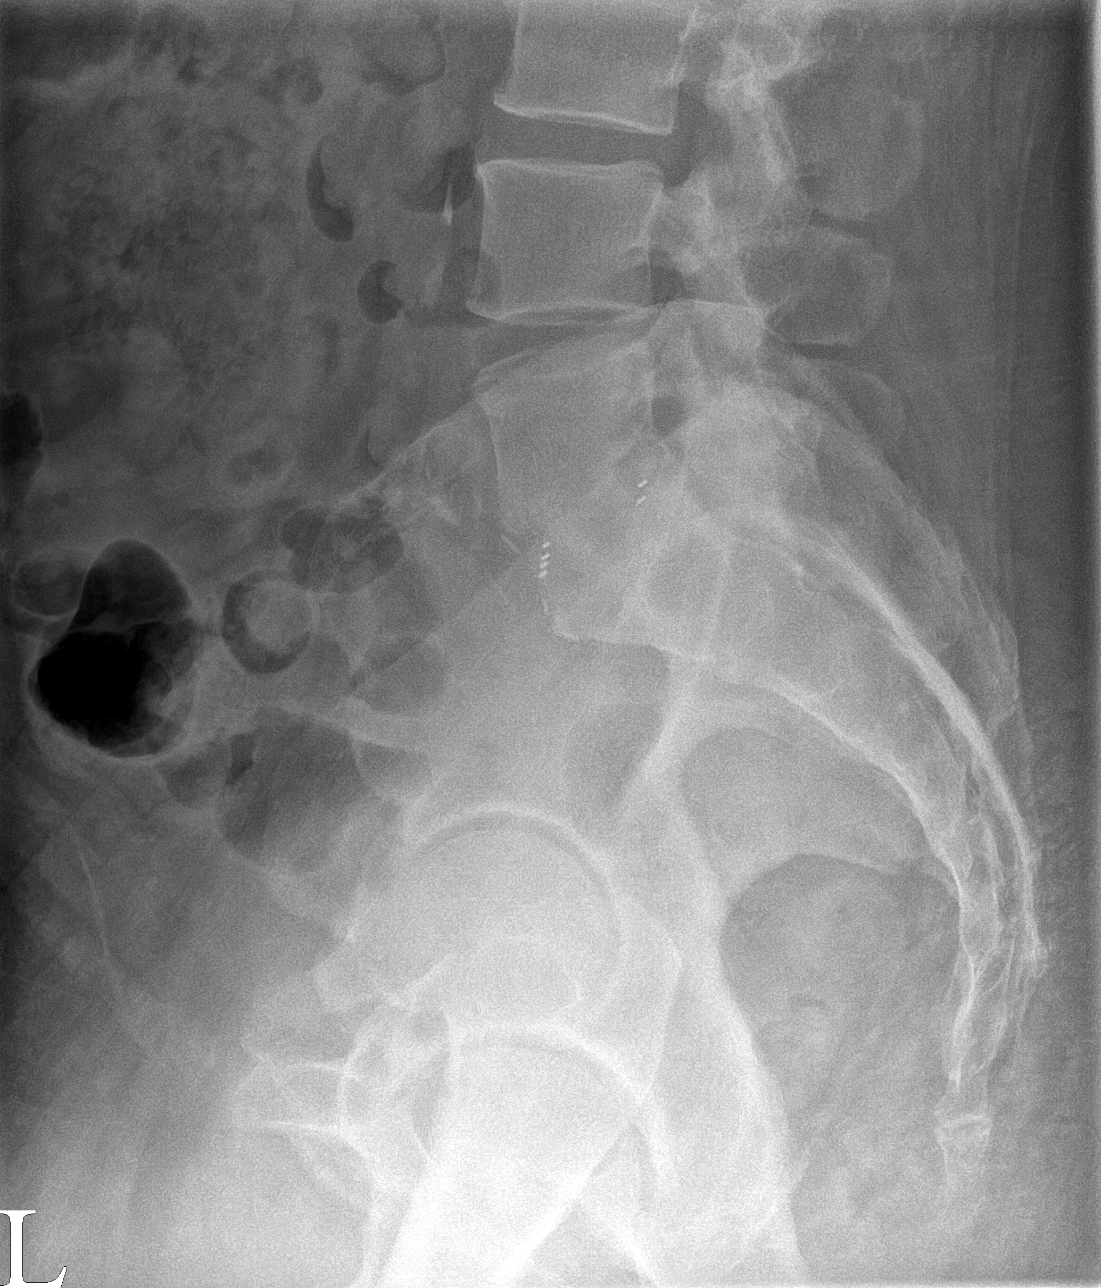

[5 of 5 positions shown; findings below may reference images not displayed]

FINDINGS: No fracture or spondylolisthesis is noted. Mild degenerative disc
disease is noted at L1-2 and L2-3 with anterior osteophyte
formation. Status post surgical fusion of L5-S1.
IMPRESSION: Mild multilevel degenerative disc disease. No acute abnormality seen
in the lumbar spine.

## 2022-11-07 ENCOUNTER — Other Ambulatory Visit (HOSPITAL_COMMUNITY): Payer: Self-pay

## 2022-11-07 MED ORDER — HYDROCODONE-ACETAMINOPHEN 10-325 MG PO TABS
1.0000 | ORAL_TABLET | Freq: Four times a day (QID) | ORAL | 0 refills | Status: DC
  Filled 2022-11-07: qty 120, 30d supply, fill #0

## 2022-11-16 ENCOUNTER — Ambulatory Visit
Admission: RE | Admit: 2022-11-16 | Discharge: 2022-11-16 | Disposition: A | Discharge status: Home / Self Care | Payer: 59 | Source: Ambulatory Visit | Attending: Orthopedic Surgery | Admitting: Orthopedic Surgery

## 2022-11-16 ENCOUNTER — Ambulatory Visit: Admission: RE | Admit: 2022-11-16 | Payer: 59 | Source: Ambulatory Visit

## 2022-11-16 DIAGNOSIS — M545 Low back pain, unspecified: Secondary | ICD-10-CM

## 2022-11-21 ENCOUNTER — Other Ambulatory Visit (HOSPITAL_COMMUNITY): Payer: Self-pay

## 2022-11-21 MED ORDER — HYDROCODONE-ACETAMINOPHEN 10-325 MG PO TABS
1.0000 | ORAL_TABLET | Freq: Four times a day (QID) | ORAL | 0 refills | Status: DC
  Filled 2022-11-21 – 2022-11-30 (×2): qty 120, 30d supply, fill #0

## 2022-11-26 ENCOUNTER — Other Ambulatory Visit: Payer: Self-pay | Admitting: Student in an Organized Health Care Education/Training Program

## 2022-11-26 ENCOUNTER — Other Ambulatory Visit (HOSPITAL_COMMUNITY): Payer: Self-pay

## 2022-11-26 ENCOUNTER — Other Ambulatory Visit: Payer: Self-pay

## 2022-11-26 DIAGNOSIS — G4700 Insomnia, unspecified: Secondary | ICD-10-CM

## 2022-11-28 ENCOUNTER — Other Ambulatory Visit (HOSPITAL_COMMUNITY): Payer: Self-pay

## 2022-11-30 ENCOUNTER — Other Ambulatory Visit (HOSPITAL_COMMUNITY): Payer: Self-pay

## 2022-12-03 ENCOUNTER — Other Ambulatory Visit: Payer: Self-pay | Admitting: Internal Medicine

## 2023-01-03 ENCOUNTER — Other Ambulatory Visit (HOSPITAL_COMMUNITY): Payer: Self-pay

## 2023-01-03 MED ORDER — HYDROCODONE-ACETAMINOPHEN 10-325 MG PO TABS
1.0000 | ORAL_TABLET | Freq: Four times a day (QID) | ORAL | 0 refills | Status: DC
Start: 2023-01-03 — End: 2023-01-31
  Filled 2023-01-03: qty 120, 30d supply, fill #0

## 2023-01-31 ENCOUNTER — Other Ambulatory Visit (HOSPITAL_COMMUNITY): Payer: Self-pay

## 2023-01-31 MED ORDER — HYDROCODONE-ACETAMINOPHEN 10-325 MG PO TABS
1.0000 | ORAL_TABLET | Freq: Four times a day (QID) | ORAL | 0 refills | Status: DC
Start: 2023-01-31 — End: 2023-05-03
  Filled 2023-01-31: qty 120, 30d supply, fill #0

## 2023-03-09 ENCOUNTER — Other Ambulatory Visit (HOSPITAL_COMMUNITY): Payer: Self-pay

## 2023-03-12 ENCOUNTER — Other Ambulatory Visit (HOSPITAL_COMMUNITY): Payer: Self-pay

## 2023-04-23 ENCOUNTER — Other Ambulatory Visit: Payer: Self-pay

## 2023-05-02 ENCOUNTER — Other Ambulatory Visit (HOSPITAL_COMMUNITY): Payer: Self-pay

## 2023-05-03 ENCOUNTER — Other Ambulatory Visit (HOSPITAL_COMMUNITY): Payer: Self-pay

## 2023-05-03 MED ORDER — HYDROCODONE-ACETAMINOPHEN 10-325 MG PO TABS
1.0000 | ORAL_TABLET | Freq: Four times a day (QID) | ORAL | 0 refills | Status: DC
Start: 2023-05-03 — End: 2023-05-30
  Filled 2023-05-03: qty 120, 30d supply, fill #0

## 2023-05-10 ENCOUNTER — Other Ambulatory Visit: Payer: Self-pay | Admitting: Student in an Organized Health Care Education/Training Program

## 2023-05-10 NOTE — Telephone Encounter (Signed)
Patient no longer under prescriber care

## 2023-05-30 ENCOUNTER — Other Ambulatory Visit (HOSPITAL_COMMUNITY): Payer: Self-pay

## 2023-05-30 MED ORDER — HYDROCODONE-ACETAMINOPHEN 10-325 MG PO TABS
1.0000 | ORAL_TABLET | Freq: Four times a day (QID) | ORAL | 0 refills | Status: AC
Start: 2023-05-30 — End: ?
  Filled 2023-05-30: qty 120, 30d supply, fill #0

## 2023-06-13 ENCOUNTER — Other Ambulatory Visit: Payer: Self-pay

## 2023-06-28 ENCOUNTER — Other Ambulatory Visit: Payer: Self-pay

## 2023-07-02 ENCOUNTER — Other Ambulatory Visit (HOSPITAL_COMMUNITY): Payer: Self-pay

## 2023-07-08 ENCOUNTER — Other Ambulatory Visit: Payer: Self-pay

## 2023-07-30 ENCOUNTER — Other Ambulatory Visit (HOSPITAL_COMMUNITY): Payer: Self-pay

## 2023-07-30 MED ORDER — HYDROCODONE-ACETAMINOPHEN 10-325 MG PO TABS
1.0000 | ORAL_TABLET | Freq: Four times a day (QID) | ORAL | 0 refills | Status: AC | PRN
Start: 2023-07-30 — End: ?
  Filled 2023-07-30: qty 120, 30d supply, fill #0

## 2024-01-28 ENCOUNTER — Other Ambulatory Visit (HOSPITAL_COMMUNITY): Payer: Self-pay

## 2024-01-28 MED ORDER — HYDROCODONE-ACETAMINOPHEN 7.5-325 MG PO TABS
1.0000 | ORAL_TABLET | Freq: Four times a day (QID) | ORAL | 0 refills | Status: AC
Start: 2024-01-28 — End: ?
  Filled 2024-01-28: qty 120, 30d supply, fill #0
